# Patient Record
Sex: Female | Born: 1935
Health system: Southern US, Community
[De-identification: ages and names within clinical notes are randomized; demographics above are authoritative.]

## PROBLEM LIST (undated history)

## (undated) ENCOUNTER — Emergency Department (HOSPITAL_COMMUNITY): Payer: PRIVATE HEALTH INSURANCE | Source: Home / Self Care

## (undated) DIAGNOSIS — K5730 Diverticulosis of large intestine without perforation or abscess without bleeding: Secondary | ICD-10-CM

## (undated) DIAGNOSIS — N23 Unspecified renal colic: Secondary | ICD-10-CM

## (undated) DIAGNOSIS — K219 Gastro-esophageal reflux disease without esophagitis: Secondary | ICD-10-CM

## (undated) DIAGNOSIS — E785 Hyperlipidemia, unspecified: Secondary | ICD-10-CM

## (undated) DIAGNOSIS — R42 Dizziness and giddiness: Secondary | ICD-10-CM

## (undated) HISTORY — DX: Dizziness and giddiness: R42

## (undated) HISTORY — DX: Diverticulosis of large intestine without perforation or abscess without bleeding: K57.30

## (undated) HISTORY — DX: Gastro-esophageal reflux disease without esophagitis: K21.9

## (undated) HISTORY — DX: Hyperlipidemia, unspecified: E78.5

## (undated) HISTORY — PX: ABDOMINAL HYSTERECTOMY: SHX81

## (undated) HISTORY — PX: EYE SURGERY: SHX253

## (undated) HISTORY — DX: Unspecified renal colic: N23

---

## 1987-05-23 HISTORY — PX: CHOLECYSTECTOMY: SHX55

## 2000-05-22 HISTORY — PX: CATARACT EXTRACTION: SUR2

## 2000-12-19 ENCOUNTER — Emergency Department (HOSPITAL_COMMUNITY): Admission: EM | Admit: 2000-12-19 | Discharge: 2000-12-19 | Payer: Self-pay | Admitting: Emergency Medicine

## 2002-08-08 ENCOUNTER — Encounter: Payer: Self-pay | Admitting: *Deleted

## 2002-08-08 ENCOUNTER — Ambulatory Visit (HOSPITAL_COMMUNITY): Admission: RE | Admit: 2002-08-08 | Discharge: 2002-08-08 | Payer: Self-pay | Admitting: *Deleted

## 2002-08-13 ENCOUNTER — Encounter: Payer: Self-pay | Admitting: Family Medicine

## 2002-08-13 ENCOUNTER — Ambulatory Visit (HOSPITAL_COMMUNITY): Admission: RE | Admit: 2002-08-13 | Discharge: 2002-08-13 | Payer: Self-pay | Admitting: Family Medicine

## 2003-08-10 ENCOUNTER — Ambulatory Visit (HOSPITAL_COMMUNITY): Admission: RE | Admit: 2003-08-10 | Discharge: 2003-08-10 | Payer: Self-pay | Admitting: Family Medicine

## 2004-07-11 ENCOUNTER — Ambulatory Visit: Payer: Self-pay | Admitting: Family Medicine

## 2004-08-11 ENCOUNTER — Ambulatory Visit (HOSPITAL_COMMUNITY): Admission: RE | Admit: 2004-08-11 | Discharge: 2004-08-11 | Payer: Self-pay | Admitting: Family Medicine

## 2004-09-08 ENCOUNTER — Ambulatory Visit: Payer: Self-pay | Admitting: Internal Medicine

## 2004-09-09 ENCOUNTER — Ambulatory Visit (HOSPITAL_COMMUNITY): Admission: RE | Admit: 2004-09-09 | Discharge: 2004-09-09 | Payer: Self-pay | Admitting: Internal Medicine

## 2005-04-06 ENCOUNTER — Ambulatory Visit: Payer: Self-pay | Admitting: Family Medicine

## 2005-04-07 ENCOUNTER — Ambulatory Visit (HOSPITAL_COMMUNITY): Admission: RE | Admit: 2005-04-07 | Discharge: 2005-04-07 | Payer: Self-pay | Admitting: Family Medicine

## 2005-05-18 ENCOUNTER — Ambulatory Visit: Payer: Self-pay | Admitting: Internal Medicine

## 2005-05-22 HISTORY — PX: ESOPHAGOGASTRODUODENOSCOPY: SHX1529

## 2005-05-23 ENCOUNTER — Ambulatory Visit (HOSPITAL_COMMUNITY): Admission: RE | Admit: 2005-05-23 | Discharge: 2005-05-23 | Payer: Self-pay | Admitting: Internal Medicine

## 2005-05-23 ENCOUNTER — Ambulatory Visit: Payer: Self-pay | Admitting: Internal Medicine

## 2005-05-25 ENCOUNTER — Ambulatory Visit: Payer: Self-pay | Admitting: Family Medicine

## 2005-06-22 ENCOUNTER — Ambulatory Visit (HOSPITAL_COMMUNITY): Admission: RE | Admit: 2005-06-22 | Discharge: 2005-06-22 | Payer: Self-pay | Admitting: Family Medicine

## 2005-07-31 ENCOUNTER — Inpatient Hospital Stay (HOSPITAL_COMMUNITY): Admission: EM | Admit: 2005-07-31 | Discharge: 2005-08-01 | Payer: Self-pay | Admitting: Emergency Medicine

## 2005-09-07 ENCOUNTER — Ambulatory Visit (HOSPITAL_COMMUNITY): Admission: RE | Admit: 2005-09-07 | Discharge: 2005-09-07 | Payer: Self-pay | Admitting: Family Medicine

## 2005-09-25 ENCOUNTER — Ambulatory Visit (HOSPITAL_COMMUNITY): Admission: RE | Admit: 2005-09-25 | Discharge: 2005-09-25 | Payer: Self-pay | Admitting: Urology

## 2005-10-04 ENCOUNTER — Ambulatory Visit (HOSPITAL_COMMUNITY): Admission: RE | Admit: 2005-10-04 | Discharge: 2005-10-04 | Payer: Self-pay | Admitting: Family Medicine

## 2005-11-29 ENCOUNTER — Ambulatory Visit: Payer: Self-pay | Admitting: Family Medicine

## 2006-02-07 ENCOUNTER — Ambulatory Visit: Payer: Self-pay | Admitting: Family Medicine

## 2006-02-08 ENCOUNTER — Ambulatory Visit (HOSPITAL_COMMUNITY): Admission: RE | Admit: 2006-02-08 | Discharge: 2006-02-08 | Payer: Self-pay | Admitting: Family Medicine

## 2006-02-12 ENCOUNTER — Ambulatory Visit (HOSPITAL_COMMUNITY): Admission: RE | Admit: 2006-02-12 | Discharge: 2006-02-12 | Payer: Self-pay | Admitting: Family Medicine

## 2006-04-11 ENCOUNTER — Ambulatory Visit: Payer: Self-pay | Admitting: Family Medicine

## 2006-07-02 ENCOUNTER — Encounter: Payer: Self-pay | Admitting: Family Medicine

## 2006-07-02 LAB — CONVERTED CEMR LAB
ALT: 16 units/L (ref 0–35)
Alkaline Phosphatase: 80 units/L (ref 39–117)
BUN: 13 mg/dL (ref 6–23)
Bilirubin, Direct: 0.1 mg/dL (ref 0.0–0.3)
Cholesterol: 225 mg/dL — ABNORMAL HIGH (ref 0–200)
Creatinine, Ser: 0.83 mg/dL (ref 0.40–1.20)
Glucose, Bld: 100 mg/dL — ABNORMAL HIGH (ref 70–99)
Indirect Bilirubin: 0.5 mg/dL (ref 0.0–0.9)
Potassium: 4.3 meq/L (ref 3.5–5.3)
Total Protein: 6.6 g/dL (ref 6.0–8.3)
Triglycerides: 118 mg/dL (ref <150)
VLDL: 24 mg/dL (ref 0–40)

## 2006-07-04 ENCOUNTER — Ambulatory Visit: Payer: Self-pay | Admitting: Family Medicine

## 2006-10-17 ENCOUNTER — Ambulatory Visit (HOSPITAL_COMMUNITY): Admission: RE | Admit: 2006-10-17 | Discharge: 2006-10-17 | Payer: Self-pay | Admitting: Family Medicine

## 2006-10-24 ENCOUNTER — Encounter: Payer: Self-pay | Admitting: Family Medicine

## 2006-10-24 ENCOUNTER — Other Ambulatory Visit: Admission: RE | Admit: 2006-10-24 | Discharge: 2006-10-24 | Payer: Self-pay | Admitting: Family Medicine

## 2006-10-24 ENCOUNTER — Ambulatory Visit: Payer: Self-pay | Admitting: Family Medicine

## 2007-03-25 ENCOUNTER — Ambulatory Visit: Payer: Self-pay | Admitting: Family Medicine

## 2007-08-01 ENCOUNTER — Encounter: Payer: Self-pay | Admitting: Family Medicine

## 2007-08-01 LAB — CONVERTED CEMR LAB
BUN: 10 mg/dL (ref 6–23)
Basophils Absolute: 0 10*3/uL (ref 0.0–0.1)
Basophils Relative: 1 % (ref 0–1)
Cholesterol: 182 mg/dL (ref 0–200)
Creatinine, Ser: 0.81 mg/dL (ref 0.40–1.20)
Eosinophils Absolute: 0.1 10*3/uL (ref 0.0–0.7)
HDL: 53 mg/dL (ref >39)
Hemoglobin: 14.2 g/dL (ref 12.0–15.0)
MCHC: 32.6 g/dL (ref 30.0–36.0)
MCV: 92 fL (ref 78.0–100.0)
Monocytes Absolute: 0.7 10*3/uL (ref 0.1–1.0)
Monocytes Relative: 9 % (ref 3–12)
Neutro Abs: 4.5 10*3/uL (ref 1.7–7.7)
Neutrophils Relative %: 59 % (ref 43–77)
RBC: 4.74 M/uL (ref 3.87–5.11)
RDW: 14.1 % (ref 11.5–15.5)
Triglycerides: 122 mg/dL (ref <150)

## 2007-08-06 ENCOUNTER — Ambulatory Visit: Payer: Self-pay | Admitting: Family Medicine

## 2007-08-06 ENCOUNTER — Ambulatory Visit (HOSPITAL_COMMUNITY): Admission: RE | Admit: 2007-08-06 | Discharge: 2007-08-06 | Payer: Self-pay | Admitting: Family Medicine

## 2007-08-13 ENCOUNTER — Encounter: Payer: Self-pay | Admitting: Family Medicine

## 2007-08-13 DIAGNOSIS — E785 Hyperlipidemia, unspecified: Secondary | ICD-10-CM | POA: Insufficient documentation

## 2007-08-13 DIAGNOSIS — N23 Unspecified renal colic: Secondary | ICD-10-CM | POA: Insufficient documentation

## 2007-08-13 DIAGNOSIS — K219 Gastro-esophageal reflux disease without esophagitis: Secondary | ICD-10-CM | POA: Insufficient documentation

## 2007-08-13 DIAGNOSIS — R42 Dizziness and giddiness: Secondary | ICD-10-CM

## 2007-08-13 DIAGNOSIS — M810 Age-related osteoporosis without current pathological fracture: Secondary | ICD-10-CM

## 2007-10-07 ENCOUNTER — Ambulatory Visit: Payer: Self-pay | Admitting: Family Medicine

## 2007-10-31 ENCOUNTER — Ambulatory Visit (HOSPITAL_COMMUNITY): Admission: RE | Admit: 2007-10-31 | Discharge: 2007-10-31 | Payer: Self-pay | Admitting: Family Medicine

## 2008-01-01 ENCOUNTER — Encounter: Payer: Self-pay | Admitting: Family Medicine

## 2008-01-07 ENCOUNTER — Ambulatory Visit: Payer: Self-pay | Admitting: Family Medicine

## 2008-04-08 ENCOUNTER — Ambulatory Visit: Payer: Self-pay | Admitting: Family Medicine

## 2008-04-14 ENCOUNTER — Encounter: Payer: Self-pay | Admitting: Family Medicine

## 2008-04-14 ENCOUNTER — Ambulatory Visit (HOSPITAL_COMMUNITY): Admission: RE | Admit: 2008-04-14 | Discharge: 2008-04-14 | Payer: Self-pay | Admitting: Family Medicine

## 2008-04-23 ENCOUNTER — Telehealth: Payer: Self-pay | Admitting: Family Medicine

## 2008-04-27 ENCOUNTER — Telehealth: Payer: Self-pay | Admitting: Family Medicine

## 2008-04-28 ENCOUNTER — Encounter: Payer: Self-pay | Admitting: Family Medicine

## 2008-04-29 ENCOUNTER — Encounter: Payer: Self-pay | Admitting: Family Medicine

## 2008-05-05 ENCOUNTER — Encounter: Payer: Self-pay | Admitting: Family Medicine

## 2008-07-08 ENCOUNTER — Encounter: Payer: Self-pay | Admitting: Family Medicine

## 2008-07-14 ENCOUNTER — Encounter: Payer: Self-pay | Admitting: Family Medicine

## 2008-09-11 ENCOUNTER — Encounter: Payer: Self-pay | Admitting: Family Medicine

## 2008-09-14 LAB — CONVERTED CEMR LAB
Basophils Absolute: 0.1 10*3/uL (ref 0.0–0.1)
Chloride: 106 meq/L (ref 96–112)
Cholesterol: 245 mg/dL — ABNORMAL HIGH (ref 0–200)
Creatinine, Ser: 1.03 mg/dL (ref 0.40–1.20)
HDL: 75 mg/dL (ref >39)
LDL Cholesterol: 144 mg/dL — ABNORMAL HIGH (ref 0–99)
Lymphocytes Relative: 40 % (ref 12–46)
Lymphs Abs: 2.6 10*3/uL (ref 0.7–4.0)
Neutro Abs: 3 10*3/uL (ref 1.7–7.7)
Neutrophils Relative %: 47 % (ref 43–77)
Platelets: 282 10*3/uL (ref 150–400)
Potassium: 5.3 meq/L (ref 3.5–5.3)
RBC: 4.81 M/uL (ref 3.87–5.11)
Triglycerides: 129 mg/dL (ref <150)
VLDL: 26 mg/dL (ref 0–40)

## 2008-09-17 ENCOUNTER — Other Ambulatory Visit: Admission: RE | Admit: 2008-09-17 | Discharge: 2008-09-17 | Payer: Self-pay | Admitting: Family Medicine

## 2008-09-17 ENCOUNTER — Ambulatory Visit: Payer: Self-pay | Admitting: Family Medicine

## 2008-09-17 ENCOUNTER — Encounter: Payer: Self-pay | Admitting: Family Medicine

## 2008-09-17 DIAGNOSIS — N760 Acute vaginitis: Secondary | ICD-10-CM | POA: Insufficient documentation

## 2008-09-17 DIAGNOSIS — N3000 Acute cystitis without hematuria: Secondary | ICD-10-CM | POA: Insufficient documentation

## 2008-09-17 LAB — CONVERTED CEMR LAB
Ketones, urine, test strip: NEGATIVE
Nitrite: NEGATIVE
Protein, U semiquant: NEGATIVE
Specific Gravity, Urine: 1.015
Urobilinogen, UA: 0.2

## 2008-09-18 ENCOUNTER — Encounter: Payer: Self-pay | Admitting: Family Medicine

## 2008-09-18 LAB — CONVERTED CEMR LAB
Candida species: NEGATIVE
Trichomonal Vaginitis: NEGATIVE

## 2008-11-17 ENCOUNTER — Ambulatory Visit (HOSPITAL_COMMUNITY): Admission: RE | Admit: 2008-11-17 | Discharge: 2008-11-17 | Payer: Self-pay | Admitting: Family Medicine

## 2009-02-18 ENCOUNTER — Ambulatory Visit: Payer: Self-pay | Admitting: Family Medicine

## 2009-02-22 LAB — CONVERTED CEMR LAB
ALT: 14 units/L (ref 0–35)
Bilirubin, Direct: 0.1 mg/dL (ref 0.0–0.3)
Indirect Bilirubin: 0.5 mg/dL (ref 0.0–0.9)
Total Bilirubin: 0.6 mg/dL (ref 0.3–1.2)
Vit D, 25-Hydroxy: 8 ng/mL — ABNORMAL LOW (ref 30–89)

## 2009-06-23 LAB — CONVERTED CEMR LAB
ALT: 13 units/L (ref 0–35)
AST: 22 units/L (ref 0–37)
Albumin: 4 g/dL (ref 3.5–5.2)
Bilirubin, Direct: 0.1 mg/dL (ref 0.0–0.3)
Cholesterol: 190 mg/dL (ref 0–200)
HDL: 73 mg/dL (ref >39)
Total CHOL/HDL Ratio: 2.6
Total Protein: 6.2 g/dL (ref 6.0–8.3)
Triglycerides: 117 mg/dL (ref <150)
VLDL: 23 mg/dL (ref 0–40)
Vit D, 25-Hydroxy: 40 ng/mL (ref 30–89)

## 2009-06-30 ENCOUNTER — Ambulatory Visit: Payer: Self-pay | Admitting: Family Medicine

## 2009-07-07 ENCOUNTER — Telehealth: Payer: Self-pay | Admitting: Family Medicine

## 2009-07-07 ENCOUNTER — Ambulatory Visit (HOSPITAL_COMMUNITY): Admission: RE | Admit: 2009-07-07 | Discharge: 2009-07-07 | Payer: Self-pay | Admitting: Family Medicine

## 2009-10-27 LAB — CONVERTED CEMR LAB
BUN: 15 mg/dL (ref 6–23)
Basophils Absolute: 0.1 10*3/uL (ref 0.0–0.1)
Chloride: 105 meq/L (ref 96–112)
Creatinine, Ser: 0.93 mg/dL (ref 0.40–1.20)
Eosinophils Relative: 4 % (ref 0–5)
HCT: 43.3 % (ref 36.0–46.0)
Lymphocytes Relative: 32 % (ref 12–46)
Lymphs Abs: 1.9 10*3/uL (ref 0.7–4.0)
Neutro Abs: 3.2 10*3/uL (ref 1.7–7.7)
Neutrophils Relative %: 54 % (ref 43–77)
Platelets: 242 10*3/uL (ref 150–400)
Potassium: 4.5 meq/L (ref 3.5–5.3)
RDW: 14 % (ref 11.5–15.5)
Vit D, 25-Hydroxy: 29 ng/mL — ABNORMAL LOW (ref 30–89)
WBC: 5.9 10*3/uL (ref 4.0–10.5)

## 2009-11-03 ENCOUNTER — Ambulatory Visit: Payer: Self-pay | Admitting: Family Medicine

## 2009-11-23 ENCOUNTER — Ambulatory Visit (HOSPITAL_COMMUNITY): Admission: RE | Admit: 2009-11-23 | Discharge: 2009-11-23 | Payer: Self-pay | Admitting: Family Medicine

## 2010-04-11 ENCOUNTER — Ambulatory Visit: Payer: Self-pay | Admitting: Family Medicine

## 2010-06-12 ENCOUNTER — Encounter: Payer: Self-pay | Admitting: Family Medicine

## 2010-06-21 NOTE — Progress Notes (Signed)
Summary: ? medicine  Phone Note Call from Patient   Summary of Call: wants to know does she need to stop taking the lovastatin call her back at [phone] Initial call taken by: Dierdre Harness,  July 07, 2009 9:28 AM  Follow-up for Phone Call        advise she needs to cntinue this and pls refill x 4 Follow-up by: Tula Nakayama MD,  July 07, 2009 9:47 AM    Prescriptions: LOVASTATIN 40 MG TABS (LOVASTATIN) Take 1 tab by mouth at bedtime  #30 x 3   Entered by:   Baldomero Lamy LPN   Authorized by:   Tula Nakayama MD   Signed by:   Baldomero Lamy LPN on 075-GRM   Method used:   Electronically to        [facility] [address] (retail)       [address]       [place], [place]  [address]       Ph: [phone]       Fax: [phone]   RxID:   [id number]

## 2010-06-21 NOTE — Assessment & Plan Note (Signed)
Summary: office visit   Vital Signs:  Patient profile:   77 year old female Height:      58.5 inches Weight:      113.25 pounds BMI:     23.35 O2 Sat:      97 % Pulse rate:   80 / minute Pulse rhythm:   regular Resp:     16 per minute BP sitting:   118 / 72  (left arm) Cuff size:   regular CC: Follow up visit   CC:  Follow up visit.  History of Present Illness: Pt reports tha t she tried foasamax for 2 months  however it made her nervous, with palpitations, and generally feeling poorly sio she stopped. She takes calium and vit d ever day She denies any recent fever or chills. She denies head or chest congestion. She denies dysuria , frequency or incontinence. She denies significant joint pain or stiffness, but she does have intermittentt aches and pains as is to be expected  Current Medications (verified): 1)  Omeprazole 20 Mg  Tbec (Omeprazole) .... Take 1 Tablet By Mouth Once A Day 2)  Oscal 500/200 D-3 500-200 Mg-Unit  Tabs (Calcium-Vitamin D) .... Take 1 Tablet By Mouth Three Times A Day 3)  Aspirin 81 Mg  Tbec (Aspirin) .... Take 1 Tablet By Mouth Once A Day 4)  Vitamin D 400 Unit Caps (Cholecalciferol) .... Take 1 Tablet By Mouth Once A Day 5)  Lovastatin 40 Mg Tabs (Lovastatin) .... Take 1 Tab By Mouth At Bedtime 6)  Vitamin D (Ergocalciferol) 50000 Unit Caps (Ergocalciferol) .... One Cap By Mouth Weekly  Allergies: 1)  ! Penicillin 2)  ! Fosamax  Review of Systems      See HPI General:  Complains of fatigue; denies loss of appetite, malaise, weakness, and weight loss. Eyes:  Denies discharge, eye pain, and red eye. ENT:  Denies earache, hoarseness, nasal congestion, and sinus pressure. CV:  Denies chest pain or discomfort, difficulty breathing while lying down, palpitations, and swelling of feet. Resp:  Denies cough and sputum productive. GI:  Denies abdominal pain, constipation, diarrhea, nausea, and vomiting. GU:  Denies dysuria, urinary frequency, and urinary  hesitancy. Derm:  Denies itching, lesion(s), and rash. Neuro:  Denies headaches, memory loss, seizures, and sensation of room spinning. Psych:  Denies anxiety and depression. Endo:  Denies excessive thirst and excessive urination. Heme:  Denies abnormal bruising and bleeding. Allergy:  Complains of seasonal allergies; denies hives or rash and itching eyes.  Physical Exam  General:  alert, well-nourished, and well-hydrated.  HEENT: No facial asymmetry,  EOMI, No sinus tenderness, TM's Clear, oropharynx  pink and moist.   Chest: Clear to auscultation bilaterally.  CVS: S1, S2, No murmurs, No S3.   Abd: Soft, Nontender.  MS: Adequate ROM spine, hips, shoulders and knees.  Ext: No edema.   CNS: CN 2-12 intact, power tone and sensation normal throughout.   Skin: Intact, no visible lesions or rashes.  Psych: Good eye contact, normal affect.  Memory intact, not anxious or depressed appearing.     Impression & Recommendations:  Problem # 1:  GERD (ICD-530.81) Assessment Improved  Her updated medication list for this problem includes:    Omeprazole 20 Mg Tbec (Omeprazole) .Marland Kitchen... Take 1 tablet by mouth once a day  Problem # 2:  OSTEOPOROSIS (ICD-733.00) Assessment: Comment Only  The following medications were removed from the medication list:    Alendronate Sodium 70 Mg Tabs (Alendronate sodium) ..... One tab once  weekly will attempt to ise calcitonin in oits place, will contact pt  Problem # 3:  HYPERLIPIDEMIA (ICD-272.4) Assessment: Improved  Her updated medication list for this problem includes:    Lovastatin 40 Mg Tabs (Lovastatin) .Marland Kitchen... Take 1 tab by mouth at bedtime  Labs Reviewed: SGOT: 22 (06/23/2009)   SGPT: 13 (06/23/2009)   HDL:73 (06/23/2009), 75 (09/11/2008)  LDL:94 (06/23/2009), 144 (09/11/2008)  Chol:190 (06/23/2009), 245 (09/11/2008)  Trig:117 (06/23/2009), 129 (09/11/2008), needs fasting labs in August  Complete Medication List: 1)  Omeprazole 20 Mg Tbec  (Omeprazole) .... Take 1 tablet by mouth once a day 2)  Oscal 500/200 D-3 500-200 Mg-unit Tabs (Calcium-vitamin d) .... Take 1 tablet by mouth three times a day 3)  Aspirin 81 Mg Tbec (Aspirin) .... Take 1 tablet by mouth once a day 4)  Vitamin D 400 Unit Caps (Cholecalciferol) .... Take 1 tablet by mouth once a day 5)  Lovastatin 40 Mg Tabs (Lovastatin) .... Take 1 tab by mouth at bedtime 6)  Vitamin D (ergocalciferol) 50000 Unit Caps (Ergocalciferol) .... One cap by mouth weekly  Patient Instructions: 1)  Please schedule a follow-up appointment in 4 months. 2)  Your cholesterol is great, pls continue the lovastatin. 3)  Your vit D level has fallen slightly it is impt to take TWO vit D 400IU tabs every day till done then the new calc with d tab 1200mg /100-0 4)  Continue to be active and involved  in your home and community 5)  Schedule your mammogram. Prescriptions: LOVASTATIN 40 MG TABS (LOVASTATIN) Take 1 tab by mouth at bedtime  #30 x 5   Entered by:   Kate Sable LPN   Authorized by:   Tula Nakayama MD   Signed by:   Kate Sable LPN on 579FGE   Method used:   Electronically to        [facility] [address] (retail)       [place], [place]  [address]       Ph: [phone]       Fax: [phone]   RxID:   [id number] OMEPRAZOLE 20 MG  TBEC (OMEPRAZOLE) Take 1 tablet by mouth once a day  #30 Capsule x 5   Entered by:   Kate Sable LPN   Authorized by:   Tula Nakayama MD   Signed by:   Kate Sable LPN on 579FGE   Method used:   Electronically to        [facility] [address] (retail)       [address]       [place], [place]  [address]       Ph: [phone]       Fax: [phone]   RxID:   [id number]

## 2010-06-21 NOTE — Assessment & Plan Note (Signed)
Summary: f up   Vital Signs:  Patient profile:   77 year old female Height:      58.5 inches Weight:      110.75 pounds BMI:     22.84 O2 Sat:      99 % on Room air Pulse rate:   75 / minute Resp:     16 per minute BP sitting:   140 / 70  (left arm)  Vitals Entered By: Baldomero Lamy LPN (November 21, 624THL 10:06 AM)  O2 Flow:  Room air CC: follow-up visit, Depression Is Patient Diabetic? No Pain Assessment Patient in pain? no        CC:  follow-up visit and Depression.  History of Present Illness: pt states this ast weekend on friday she std having lightheadedness, and spinning in the head , aslo some nausea and imbalance. Last episode about 4 yrs ago. She ahs used dramaminewith some beenfit. wats meclizine sent in has only taken 2 dramamine yesterday, that's all , feels a bit weak but not as light headed and unsteady as before. Reports  that she has otherwise been  doing well. Denies recent fever or chills. Denies sinus pressure, nasal congestion , ear pain or sore throat. Denies chest congestion, or cough productive of sputum. Denies chest pain, palpitations, PND, orthopnea or leg swelling. Denies abdominal pain, nausea, vomitting, diarrhea or constipation. Denies change in bowel movements or bloody stool. Denies dysuria , frequency, incontinence or hesitancy. Denies  joint pain, swelling, or reduced mobility. Denies headaches, , seizures. Denies depression, anxiety or insomnia. Denies  rash, lesions, or itch.      Current Medications (verified): 1)  Omeprazole 20 Mg  Tbec (Omeprazole) .... Take 1 Tablet By Mouth Once A Day 2)  Aspirin 81 Mg  Tbec (Aspirin) .... Take 1 Tablet By Mouth Once A Day 3)  Vitamin D 400 Unit Caps (Cholecalciferol) .... Take 1 Tablet By Mouth Once A Day 4)  Lovastatin 40 Mg Tabs (Lovastatin) .... Take 1 Tab By Mouth At Bedtime 5)  Calcium Antacid 500 Mg Chew (Calcium Carbonate Antacid) .... One Tab By Mouth Once Daily  Allergies  (verified): 1)  ! Penicillin 2)  ! Fosamax  Review of Systems      See HPI Eyes:  Denies discharge, eye pain, and red eye. Psych:  Denies anxiety and depression. Endo:  Denies cold intolerance, excessive hunger, excessive thirst, and heat intolerance. Heme:  Denies abnormal bruising and bleeding. Allergy:  Denies hives or rash and itching eyes.  Physical Exam  General:  Well-developed,well-nourished,in no acute distress; alert,appropriate and cooperative throughout examination HEENT: No facial asymmetry,  EOMI, No sinus tenderness, TM's Clear, oropharynx  pink and moist.   Chest: Clear to auscultation bilaterally.  CVS: S1, S2, No murmurs, No S3.   Abd: Soft, Nontender.  MS: Adequate ROM spine, hips, shoulders and knees.  Ext: No edema.   CNS: CN 2-12 intact, power tone and sensation normal throughout.   Skin: Intact, no visible lesions or rashes.  Psych: Good eye contact, normal affect.  Memory intact, not anxious or depressed appearing.    Impression & Recommendations:  Problem # 1:  INTERMITTENT VERTIGO (ICD-780.4) Assessment Comment Only  Her updated medication list for this problem includes:    Meclizine Hcl 12.5 Mg Tabs (Meclizine hcl) .Marland Kitchen... Take 1 tablet by mouth three times a day as needed for dizziness  Orders: Medicare Electronic Prescription [phone])  Problem # 2:  GERD (ICD-530.81) Assessment: Unchanged  Her updated medication  list for this problem includes:    Omeprazole 20 Mg Tbec (Omeprazole) .Marland Kitchen... Take 1 tablet by mouth once a day    Calcium Antacid 500 Mg Chew (Calcium carbonate antacid) ..... One tab by mouth once daily  Problem # 3:  HYPERLIPIDEMIA (ICD-272.4) Assessment: Comment Only  Her updated medication list for this problem includes:    Lovastatin 40 Mg Tabs (Lovastatin) .Marland Kitchen... Take 1 tab by mouth at bedtime Low fat dietdiscussed and encouraged  Labs Reviewed: SGOT: 22 (06/23/2009)   SGPT: 13 (06/23/2009)   HDL:73 (06/23/2009), 75  (09/11/2008)  LDL:94 (06/23/2009), 144 (09/11/2008)  Chol:190 (06/23/2009), 245 (09/11/2008)  Trig:117 (06/23/2009), 129 (09/11/2008)  Complete Medication List: 1)  Omeprazole 20 Mg Tbec (Omeprazole) .... Take 1 tablet by mouth once a day 2)  Aspirin 81 Mg Tbec (Aspirin) .... Take 1 tablet by mouth once a day 3)  Vitamin D 400 Unit Caps (Cholecalciferol) .... Take 1 tablet by mouth once a day 4)  Lovastatin 40 Mg Tabs (Lovastatin) .... Take 1 tab by mouth at bedtime 5)  Calcium Antacid 500 Mg Chew (Calcium carbonate antacid) .... One tab by mouth once daily 6)  Meclizine Hcl 12.5 Mg Tabs (Meclizine hcl) .... Take 1 tablet by mouth three times a day as needed for dizziness  Other Orders: T-Lipid Profile [id number]) T-Hepatic Function [id number])  Patient Instructions: 1)  Please schedule a follow-up appointment in 4.5 months. 2)  Hepatic Panel prior to visit, ICD-9: 3)  Lipid Panel prior to visit, ICD-9:  fasting in 4.5 months 4)  med for dizziness is sent in Prescriptions: LOVASTATIN 40 MG TABS (LOVASTATIN) Take 1 tab by mouth at bedtime  #30 Tablet x 3   Entered by:   Baldomero Lamy LPN   Authorized by:   Tula Nakayama MD   Signed by:   Baldomero Lamy LPN on 075-GRM   Method used:   Electronically to        [facility] [address] (retail)       [address]       [place], [place]  [address]       Ph: [phone]       Fax: [phone]   RxID:   [id number] MECLIZINE HCL 12.5 MG TABS (MECLIZINE HCL) Take 1 tablet by mouth three times a day as needed for dizziness  #30 x 0   Entered and Authorized by:   Tula Nakayama MD   Signed by:   Tula Nakayama MD on 04/11/2010   Method used:   Electronically to        [facility] (retail)       [address]       [place], [place]  [address]       Ph: [phone]       Fax: [phone]   RxID:   [id number]    Orders Added: 1)  Est. Patient Level IV  [id number] 2)  Medicare Electronic Prescription [id number])  T-Lipid Profile [id number])  T-Hepatic Function [id number]

## 2010-06-21 NOTE — Assessment & Plan Note (Signed)
Summary: office visit   Vital Signs:  Patient profile:   77 year old female Height:      58.5 inches Weight:      112 pounds O2 Sat:      97 % Pulse rate:   90 / minute Pulse rhythm:   regular Resp:     16 per minute BP sitting:   110 / 68  (left arm) Cuff size:   regular  Vitals Entered By: Kate Sable (June 30, 2009 9:34 AM) CC: Follow up chronic problems Is Patient Diabetic? No Pain Assessment Patient in pain? no        CC:  Follow up chronic problems.  History of Present Illness: Reports  that tshe has been doing well. Denies recent fever or chills. Denies sinus pressure, nasal congestion , ear pain or sore throat. Denies chest congestion, or cough productive of sputum. Denies chest pain, palpitations, PND, orthopnea or leg swelling. Denies abdominal pain, nausea, vomitting, diarrhea or constipation. Denies change in bowel movements or bloody stool. Denies dysuria , frequency, incontinence or hesitancy. Denies  joint pain, swelling, or reduced mobility. Denies headaches, vertigo, seizures. Denies depression, anxiety or insomnia. Denies  rash, lesions, or itch.     Anticoagulation Management History:      Positive risk factors for bleeding include an age of 58 years or older.  The bleeding index is 'intermediate risk'.  Positive CHADS2 values include Age > 73 years old.     Current Medications (verified): 1)  Omeprazole 20 Mg  Tbec (Omeprazole) .... Take 1 Tablet By Mouth Once A Day 2)  Oscal 500/200 D-3 500-200 Mg-Unit  Tabs (Calcium-Vitamin D) .... Take 1 Tablet By Mouth Three Times A Day 3)  Aspirin 81 Mg  Tbec (Aspirin) .... Take 1 Tablet By Mouth Once A Day 4)  Vitamin D 400 Unit Caps (Cholecalciferol) .... Take 1 Tablet By Mouth Once A Day 5)  Lovastatin 40 Mg Tabs (Lovastatin) .... Take 1 Tab By Mouth At Bedtime 6)  Vitamin D (Ergocalciferol) 50000 Unit Caps (Ergocalciferol) .... One Cap By Mouth Weekly  Allergies (verified): 1)  !  Penicillin  Review of Systems      See HPI Eyes:  Denies blurring and discharge. Heme:  Denies abnormal bruising. Allergy:  Denies hives or rash and itching eyes.  Physical Exam  General:  alert, well-nourished, and well-hydrated.  HEENT: No facial asymmetry,  EOMI, No sinus tenderness, TM's Clear, oropharynx  pink and moist.   Chest: Clear to auscultation bilaterally.  CVS: S1, S2, No murmurs, No S3.   Abd: Soft, Nontender.  MS: Adequate ROM spine, hips, shoulders and knees.  Ext: No edema.   CNS: CN 2-12 intact, power tone and sensation normal throughout.   Skin: Intact, no visible lesions or rashes.  Psych: Good eye contact, normal affect.  Memory intact, not anxious or depressed appearing.     Impression & Recommendations:  Problem # 1:  GERD (ICD-530.81) Assessment Improved  Her updated medication list for this problem includes:    Omeprazole 20 Mg Tbec (Omeprazole) .Marland Kitchen... Take 1 tablet by mouth once a day  Problem # 2:  HYPERLIPIDEMIA (ICD-272.4) Assessment: Comment Only  Her updated medication list for this problem includes:    Lovastatin 40 Mg Tabs (Lovastatin) .Marland Kitchen... Take 1 tab by mouth at bedtime  Labs Reviewed: SGOT: 22 (06/23/2009)   SGPT: 13 (06/23/2009)   HDL:73 (06/23/2009), 75 (09/11/2008)  LDL:94 (06/23/2009), 144 (09/11/2008)  Chol:190 (06/23/2009), 245 (09/11/2008)  Trig:117 (  06/23/2009), 129 (09/11/2008)  Problem # 3:  OSTEOPOROSIS (ICD-733.00) Assessment: Deteriorated  Her updated medication list for this problem includes:    Alendronate Sodium 70 Mg Tabs (Alendronate sodium) ..... One tab once weekly  Orders: T-Vitamin D (25-Hydroxy) [phone]) Radiology Referral (Radiology)  Complete Medication List: 1)  Omeprazole 20 Mg Tbec (Omeprazole) .... Take 1 tablet by mouth once a day 2)  Oscal 500/200 D-3 500-200 Mg-unit Tabs (Calcium-vitamin d) .... Take 1 tablet by mouth three times a day 3)  Aspirin 81 Mg Tbec (Aspirin) .... Take 1 tablet by  mouth once a day 4)  Vitamin D 400 Unit Caps (Cholecalciferol) .... Take 1 tablet by mouth once a day 5)  Lovastatin 40 Mg Tabs (Lovastatin) .... Take 1 tab by mouth at bedtime 6)  Vitamin D (ergocalciferol) 50000 Unit Caps (Ergocalciferol) .... One cap by mouth weekly 7)  Alendronate Sodium 70 Mg Tabs (Alendronate sodium) .... One tab once weekly  Other Orders: T-CBC w/Diff ([id number]) T-Basic Metabolic Panel ([id number])  Patient Instructions: 1)  Please schedule a follow-up appointment in 4 months. 2)  Your labs are great, no med changes. 3)  You will be referred for a bone density scan. 4)  Continue all meds

## 2010-09-05 ENCOUNTER — Other Ambulatory Visit: Payer: Self-pay | Admitting: Family Medicine

## 2010-09-05 LAB — HEPATIC FUNCTION PANEL
ALT: 14 U/L (ref 0–35)
AST: 24 U/L (ref 0–37)
Bilirubin, Direct: 0.2 mg/dL (ref 0.0–0.3)
Indirect Bilirubin: 0.5 mg/dL (ref 0.0–0.9)
Total Protein: 6.5 g/dL (ref 6.0–8.3)

## 2010-09-05 LAB — LIPID PANEL
Cholesterol: 161 mg/dL (ref 0–200)
Total CHOL/HDL Ratio: 2.2 Ratio
Triglycerides: 93 mg/dL (ref <150)

## 2010-09-08 ENCOUNTER — Encounter: Payer: Self-pay | Admitting: Family Medicine

## 2010-09-09 ENCOUNTER — Encounter: Payer: Self-pay | Admitting: Family Medicine

## 2010-09-12 ENCOUNTER — Ambulatory Visit (INDEPENDENT_AMBULATORY_CARE_PROVIDER_SITE_OTHER): Payer: MEDICARE | Admitting: Family Medicine

## 2010-09-12 ENCOUNTER — Encounter: Payer: Self-pay | Admitting: Family Medicine

## 2010-09-12 VITALS — BP 128/74 | HR 86 | Resp 16 | Ht 60.0 in | Wt 111.0 lb

## 2010-09-12 DIAGNOSIS — R109 Unspecified abdominal pain: Secondary | ICD-10-CM

## 2010-09-12 DIAGNOSIS — Z23 Encounter for immunization: Secondary | ICD-10-CM

## 2010-09-12 DIAGNOSIS — E785 Hyperlipidemia, unspecified: Secondary | ICD-10-CM

## 2010-09-12 LAB — BASIC METABOLIC PANEL
BUN: 16 mg/dL (ref 6–23)
Calcium: 10.3 mg/dL (ref 8.4–10.5)
Glucose, Bld: 96 mg/dL (ref 70–99)

## 2010-09-12 LAB — POCT URINALYSIS DIPSTICK
Nitrite, UA: NEGATIVE
Protein, UA: NEGATIVE
Urobilinogen, UA: 0.2
pH, UA: 7

## 2010-09-12 MED ORDER — OMEPRAZOLE 20 MG PO TBEC: 20.0000 mg | DELAYED_RELEASE_TABLET | Freq: Every day | ORAL | Status: DC

## 2010-09-12 NOTE — Progress Notes (Signed)
  Subjective:    Patient ID: Kristy Jarvis, female    DOB: [birth year], 77 y.o.   MRN: [id number]  HPI 6 week h/o LUQ abndominal pain, non radiaiting, no vomitting , fever, nausea or change in bowel movements. Has a large hiatal hernia, and had kidney stones in the past. Otherwise pt reports she has been well. Recent labs are to be reviewed, and her pneumonia vaccine administered      Review of Systems Denies recent fever or chills. Denies sinus pressure, nasal congestion, ear pain or sore throat. Denies chest congestion, productive cough or wheezing. Denies chest pains, palpitations, paroxysmal nocturnal dyspnea, orthopnea and leg swelling Denies  nausea, vomiting,diarrhea or constipation.  Denies rectal bleeding or change in bowel movement. Denies dysuria, frequency, hesitancy or incontinence. Denies joint pain, swelling and limitation in mobility. Denies headaches, seizure, numbness, or tingling. Denies depression, anxiety or insomnia. Denies skin break down or rash.        Objective:   Physical Exam Patient alert and oriented and in no Cardiopulmonary distress.  HEENT: No facial asymmetry, EOMI, no sinus tenderness, TM's clear, Oropharynx pink and moist.  Neck supple no adenopathy.  Chest: Clear to auscultation bilaterally.  CVS: S1, S2 no murmurs, no S3.  ABD: Soft  Left upper quadrant tenderness, with possible mass. No guarding or rebound. Bowel sounds normal  Ext: No edema  MS: Adequate ROM spine, shoulders, hips and knees.  Skin: Intact, no ulcerations or rash noted.  Psych: Good eye contact, normal affect. Memory intact not anxious or depressed appearing.  CNS: CN 2-12 intact, power, tone and sensation normal throughout.    Rectal: no mass, guaic negative stool  Assessment & Plan:  1. Abdominal pain with possible LUQ mass, needs scan. 2. H/o renal colic, re ckeck with renal US 3.GERD; check h. pylori status 4. Hyperlipidemia: lipid labs excellent,  continue current meds

## 2010-09-12 NOTE — Patient Instructions (Signed)
F/u in 4 months.  Your cholesterol and liver lab tests are great, no med changes.  You need other labs today so you can have scans of your abdomen, and you will also have [place] of your kidneys to evaluate your pain I will refer you to the specialist you need to see once I get the radiology results

## 2010-09-13 ENCOUNTER — Other Ambulatory Visit: Payer: Self-pay | Admitting: Family Medicine

## 2010-09-13 LAB — H. PYLORI ANTIBODY, IGG: H Pylori IgG: <0.4 {ISR}

## 2010-09-15 ENCOUNTER — Telehealth: Payer: Self-pay | Admitting: Family Medicine

## 2010-09-15 ENCOUNTER — Ambulatory Visit (HOSPITAL_COMMUNITY)
Admission: RE | Admit: 2010-09-15 | Discharge: 2010-09-15 | Disposition: A | Discharge status: Home / Self Care | Payer: Medicare Other | Source: Ambulatory Visit | Attending: Family Medicine | Admitting: Family Medicine

## 2010-09-15 DIAGNOSIS — R110 Nausea: Secondary | ICD-10-CM | POA: Insufficient documentation

## 2010-09-15 DIAGNOSIS — R109 Unspecified abdominal pain: Secondary | ICD-10-CM | POA: Insufficient documentation

## 2010-09-15 DIAGNOSIS — K5730 Diverticulosis of large intestine without perforation or abscess without bleeding: Secondary | ICD-10-CM | POA: Insufficient documentation

## 2010-09-15 MED ORDER — CIPROFLOXACIN HCL 500 MG PO TABS: 500.0000 mg | ORAL_TABLET | Freq: Two times a day (BID) | ORAL | Status: AC

## 2010-09-15 NOTE — Progress Notes (Signed)
Addended by: Tula Nakayama on: 09/15/2010 07:54 AM   Modules accepted: Orders

## 2010-09-15 NOTE — Telephone Encounter (Signed)
Spoke with patient yesterday.

## 2010-09-16 ENCOUNTER — Ambulatory Visit (HOSPITAL_COMMUNITY)
Admission: RE | Admit: 2010-09-16 | Discharge: 2010-09-16 | Disposition: A | Discharge status: Home / Self Care | Payer: Medicare Other | Source: Ambulatory Visit | Attending: Family Medicine | Admitting: Family Medicine

## 2010-09-16 DIAGNOSIS — R1032 Left lower quadrant pain: Secondary | ICD-10-CM | POA: Insufficient documentation

## 2010-09-16 DIAGNOSIS — K449 Diaphragmatic hernia without obstruction or gangrene: Secondary | ICD-10-CM | POA: Insufficient documentation

## 2010-09-16 DIAGNOSIS — K5730 Diverticulosis of large intestine without perforation or abscess without bleeding: Secondary | ICD-10-CM | POA: Insufficient documentation

## 2010-09-16 MED ORDER — IOHEXOL 300 MG/ML  SOLN
100.0000 mL | Freq: Once | INTRAMUSCULAR | Status: AC | PRN
  Administered 2010-09-16: 100 mL via INTRAVENOUS

## 2010-09-19 ENCOUNTER — Telehealth: Payer: Self-pay | Admitting: *Deleted

## 2010-09-19 NOTE — Telephone Encounter (Signed)
Is there an additional msg i need to know?

## 2010-09-19 NOTE — Telephone Encounter (Signed)
Message copied by Baldomero Lamy on Mon Sep 19, 2010  9:10 AM ------      Message from: Tula Nakayama      Created: Fri Sep 16, 2010 12:12 PM       pls advise the pt the only abnormality is a hernia, so this is where her pain is coming from, I suggest eval by a surgeon pls let me know if she agrees

## 2010-09-19 NOTE — Progress Notes (Signed)
Patient states pain has subsided and wants to wait before seeing a surgeon

## 2010-09-29 ENCOUNTER — Telehealth: Payer: Self-pay | Admitting: Family Medicine

## 2010-09-29 NOTE — Telephone Encounter (Signed)
Patient denies any discomfort or swelling, states only red area growing, advised if continues to grow or swell go to urgent care for eval, patient agrees

## 2010-10-07 NOTE — H&P (Signed)
NAMEJANELI, Kristy Jarvis            ACCOUNT NO.:  [id number]   MEDICAL RECORD NO.:  [id number]          PATIENT TYPE:  AMB   LOCATION:                                FACILITY:  [facility]   PHYSICIAN:  Hildred Laser, M.D.    DATE OF BIRTH:  [birth year]   DATE OF ADMISSION:  DATE OF DISCHARGE:  LH                                HISTORY & PHYSICAL   CHIEF COMPLAINT:  Esophageal dysphagia.   HISTORY OF PRESENT ILLNESS:  Kristy Jarvis is an 77 year old Caucasian female  who was seen in our office on September 08, 2004 by Myrene Buddy, P.A.-C. She  had had a couple year history of solid food dysphagia. At that time, she was  not interested in invasive procedures, but she did oblige to a barium pill  esophagogram. She was found to have a hiatal hernia with significant delay  in passage of the 3 x 13 mm barium tablet at the GE junction. Over the last  several months, she has continued to have dysphagia with solid foods. She  has changed her diet to mostly soften foods. She denies any problems with  liquids. She still has the sensation of her food being stuck in her lower  esophagus. She does have intermittent regurgitation and vomiting as well.  She takes Tums on an as-needed basis very regularly. She had been on Aciphex  20 mg daily when she was seen back in April but has since discontinued this  once she ran out of the medication. She notes ill fitting dentures that she  received last Spring. She has been having problems with chewing with these  dentures as well. She complains of rare heartburn a couple of times a week.  Denies any problems with indigestion and has rare water brash. She denies  any anorexia or early satiety. She has lost approximately three pounds in  the last eight months.   CURRENT MEDICATIONS:  Tums p.r.n.   ALLERGIES:  PENICILLIN which causes rash.   PAST MEDICAL HISTORY:  1.  Gastroesophageal reflux disease.  2.  Osteoporosis.  3.  Hyperlipidemia which is diet controlled.  4.  Hysterectomy in 1976.  5.  Laparoscopic cholecystectomy.   FAMILY HISTORY:  Significant for diabetes mellitus and coronary artery  disease. Both a brother and sister had cancer of unknown etiology.   SOCIAL HISTORY:  Kristy Jarvis is a widow. She has a daughter. She is a  Agricultural engineer. She denies any tobacco alcohol or drug use.   REVIEW OF SYSTEMS:  CONSTITUTIONAL:  See HPI. She denies fatigue, fever or  chills. CARDIOVASCULAR:  She denies any chest pain or palpitations.  PULMONARY:  Denies any shortness of breath, dyspnea, cough or hemoptysis.  GASTROINTESTINAL:  See HPI.   PHYSICAL EXAMINATION:  VITAL SIGNS:  Weight 107.5 pounds. Height 62-1/2  inches. Temperature 98.2, blood pressure 120/64, pulse 72.  GENERAL:  Kristy Jarvis is an 77 year old elderly appearing Caucasian female  who is alert, oriented, pleasant and cooperative in no acute distress.  HEENT:  Sclerae are clear and nonicteric. Conjunctivae are pink. Oropharynx  pink and moist without any lesions.  NECK:  Supple without mass or thyromegaly.  CHEST:  Heart regular rate and rhythm with normal S1 and S2 without any  murmurs, clicks, rubs or gallops.  LUNGS:  Clear to auscultation bilaterally.  ABDOMEN:  Positive bowel sounds x4. No bruits auscultated. Soft, nontender,  nondistended without palpable mass or hepatosplenomegaly. No rebound  tenderness or guarding.  RECTAL:  Deferred.  EXTREMITIES:  Without clubbing or edema bilaterally.  SKIN:  Pink, warm and dry without any rash or jaundice.   IMPRESSION:  Kristy Jarvis is an 77 year old Caucasian female with about a two  and half year history of solid food dysphagia who had a barium pill  esophagogram earlier this year which did show a delay in passage of the  barium tablet at the GE junction. There was no evidence of definite reflux,  but she did have a 5 x 6 cm hiatal hernia as well. I suspect she may have a  lower esophageal stricture. She is requesting further  evaluation and  possible manipulation via EGD in the near future.   PLAN:  1.  Will schedule EGD with possible ED with Dr. Gala Romney in the near future. I      discussed this procedure including risks and benefits which include but      are not limited to bleeding, infection, perforation, and drug reaction.      She agrees with this plan and consent will be obtained.  2.  She was offered screening colonoscopy but declined.  3.  Aciphex 20 mg daily samples, two weeks' worth were given.  4.  I have given her prescription for Aciphex 20 mg daily #30 with 5      refills.  5.  Further recommendations pending procedure.   We would like to thank Dr. Moshe Cipro for allowing [place] to participate in the  care of Kristy Jarvis.      Les Pou, N.P.      Hildred Laser, M.D.  Electronically Signed    KC/MEDQ  D:  05/18/2005  T:  05/18/2005  Job:  [id number]

## 2010-10-07 NOTE — H&P (Signed)
[name], Kristy Jarvis            ACCOUNT NO.:  [id number]   MEDICAL RECORD NO.:  [id number]          PATIENT TYPE:  INP   LOCATION:  A313                          FACILITY:  [facility]   PHYSICIAN:  Marissa Nestle, M.D.DATE OF BIRTH:  09-Aug-1920   DATE OF ADMISSION:  07/31/2005  DATE OF DISCHARGE:  03/13/2007LH                                HISTORY & PHYSICAL   CHIEF COMPLAINT:  Right renal colic.   HISTORY:  An 77 year old female presented in the emergency department early  this morning with severe pain in her right flank. She was given pain  medication, but because of continuous pain, she was admitted for further  management and control of pain. Denies any fever, chills or gross hematuria.  No history of similar pains in the past. She had a CT scan done in the  emergency room which showed there is a 3-mm stone in the right ureteropelvic  junction causing partial hydronephrosis. Otherwise, CT scan is negative.   PAST MEDICAL HISTORY:  She takes Aciphex for acid reflux. No other medical  problem like diabetes or hypertension. She did have a hysterectomy in 1976.   MEDICATIONS:  Aciphex.   PERSONAL HISTORY:  Does not smoke or drink.   REVIEW OF SYSTEMS:  Unremarkable.   PHYSICAL EXAMINATION:  GENERAL:  Fully conscious, alert, oriented with blood  pressure 130/80. Temperature is normal.  CENTRAL NERVOUS SYSTEM:  Negative.  HEENT:  Negative.  CHEST:  Symmetric.  HEART:  Regular sinus rhythm.  ABDOMEN:  Soft. Liver, spleen and kidneys are not palpable; 1+ right CVA  tenderness.  PELVIC:  Deferred.  EXTREMITIES:  Normal.   LABORATORY DATA:  CBC and urinalysis is done. CBC is normal. Urinalysis  shows microscopic hematuria. MET7 is normal.   ASSESSMENT:  She is admitted in the hospital with right renal calculus. IV  fluids are started and also parenteral analgesia. Over the next several  hours, the pain has completely subsided. I came and discussed with the  patient and his  daughter that as long as the pain is not there and no fever  or chills she can go home. I will get a KUB to make sure it is visible. If  the stone is visible on KUB and if does not come out and continues to cause  problem, we will consider doing lithotripsy. They understand, and she wants  to go home tonight, so that is okay with me. After KUB, if she is feeling  fine she can go home. I will see her back in the office in two weeks.   FINAL DIAGNOSIS:  Right renal calculus.   DISCHARGE CONDITION:  Improved.   DISCHARGE MEDICATIONS:  Percocet 1 q.6-8h. p.r.n. #30.      Marissa Nestle, M.D.  Electronically Signed     MIJ/MEDQ  D:  07/31/2005  T:  08/01/2005  Job:  [id number]

## 2010-10-07 NOTE — Op Note (Signed)
NAME:  Kristy Jarvis, Kristy Jarvis            ACCOUNT NO.:  [id number]   MEDICAL RECORD NO.:  [id number]          PATIENT TYPE:  AMB   LOCATION:  DAY                           FACILITY:  [facility]   PHYSICIAN:  R. Garfield Cornea, M.D. DATE OF BIRTH:  29-Sep-1920   DATE OF PROCEDURE:  05/23/2005  DATE OF DISCHARGE:                                 OPERATIVE REPORT   PROCEDURE:  Esophagogastroduodenoscopy with Venia Minks dilation.   INDICATIONS FOR PROCEDURE:  The patient is an 77 year old lady with  esophageal dysphagia to solids. Barium pill esophagogram demonstrated  stricture versus ring at EG junction and a hiatal hernia. EGD is now being  done. Esophageal dilation has been discussed with the patient at length.  Potential risks, benefits, and alternatives have been reviewed and questions  answered. Please see the documentation in the medical record.   PROCEDURE NOTE:  O2 saturation, blood pressure, pulse, and respirations were  monitored throughout the entire procedure. Conscious sedation with Versed 3  mg IV and Demerol 25 mg IV, Zofran 4 mg IV prophylactically for nausea for  prior to the procedure. Cetacaine spray for topical oropharyngeal  anesthesia.   INSTRUMENT:  Olympus video chip system.   FINDINGS:  Examination of the tubular esophagus revealed a prominent  Schatzki's ring. Otherwise esophageal mucosa appeared normal. EG junction  was easily traversed with the scope.   Stomach:  Gastric cavity was empty and insufflated well with air. Thorough  examination of gastric mucosa including retroflexed view of the proximal  stomach and esophagogastric junction demonstrated only a hiatal hernia.  Pylorus patent and easily traversed. Examination of bulb and second portion  revealed no abnormalities.   THERAPEUTIC/DIAGNOSTIC MANEUVERS:  A 56-French Maloney dilator was passed to  full insertion. A distinct give was felt upon full insertion. A look back  revealed the ring had been ruptured without  apparent complication. The  patient tolerated the procedure well and was reactive to endoscopy.   IMPRESSION:  1.  Prominent Schatzki's ring, otherwise normal esophagus.  2.  Small to moderate size hiatal hernia. Otherwise normal stomach, normal      D1 and D2.   RECOMMENDATIONS:  1.  Continue Aciphex 20 mg orally indefinitely.  2.  Ms. Vantiem is to let me know if she has any future trouble with      swallowing.      Bridgette Habermann, M.D.  Electronically Signed     RMR/MEDQ  D:  05/23/2005  T:  05/23/2005  Job:  [id number]   cc:   Norwood Levo. Moshe Cipro, M.D.  Fax: [phone]

## 2010-11-15 ENCOUNTER — Other Ambulatory Visit: Payer: Self-pay | Admitting: Family Medicine

## 2010-11-15 DIAGNOSIS — Z139 Encounter for screening, unspecified: Secondary | ICD-10-CM

## 2010-11-28 ENCOUNTER — Ambulatory Visit (HOSPITAL_COMMUNITY)
Admission: RE | Admit: 2010-11-28 | Discharge: 2010-11-28 | Disposition: A | Discharge status: Home / Self Care | Payer: Medicare Other | Source: Ambulatory Visit | Attending: Family Medicine | Admitting: Family Medicine

## 2010-11-28 DIAGNOSIS — Z139 Encounter for screening, unspecified: Secondary | ICD-10-CM

## 2010-11-28 DIAGNOSIS — Z1231 Encounter for screening mammogram for malignant neoplasm of breast: Secondary | ICD-10-CM | POA: Insufficient documentation

## 2011-01-13 ENCOUNTER — Encounter: Payer: Self-pay | Admitting: Family Medicine

## 2011-01-16 ENCOUNTER — Ambulatory Visit (INDEPENDENT_AMBULATORY_CARE_PROVIDER_SITE_OTHER): Payer: Medicare Other | Admitting: Family Medicine

## 2011-01-16 ENCOUNTER — Encounter: Payer: Self-pay | Admitting: Family Medicine

## 2011-01-16 VITALS — BP 130/70 | HR 68 | Ht <= 58 in | Wt 110.0 lb

## 2011-01-16 DIAGNOSIS — K219 Gastro-esophageal reflux disease without esophagitis: Secondary | ICD-10-CM

## 2011-01-16 DIAGNOSIS — N23 Unspecified renal colic: Secondary | ICD-10-CM

## 2011-01-16 DIAGNOSIS — R42 Dizziness and giddiness: Secondary | ICD-10-CM

## 2011-01-16 DIAGNOSIS — R5383 Other fatigue: Secondary | ICD-10-CM

## 2011-01-16 DIAGNOSIS — E785 Hyperlipidemia, unspecified: Secondary | ICD-10-CM

## 2011-01-16 MED ORDER — MECLIZINE HCL 12.5 MG PO TABS: 12.5000 mg | ORAL_TABLET | Freq: Three times a day (TID) | ORAL | Status: DC | PRN

## 2011-01-16 NOTE — Patient Instructions (Signed)
F/u in January  pls call your insurance re the shingles vaccine.  I am sorry that tick bit you, and am thankful this has gotten better.   Appt for flu vaccine in Sept.  Fasting labs end October  Keep well, and call if you need to see me before

## 2011-01-27 NOTE — Progress Notes (Signed)
  Subjective:    Patient ID: Kristy Jarvis, female    DOB: Mar 10, 1921, 77 y.o.   MRN: [id number]  HPI The PT is here for follow up and re-evaluation of chronic medical conditions, medication management and review of any available recent lab and radiology data.  Preventive health is updated, specifically  Cancer screening and Immunization.   Questions or concerns regarding consultations or procedures which the PT has had in the interim are  addressed. The PT denies any adverse reactions to current medications since the last visit.  There are no new concerns.  Had a tick bite approx 3 weeks ago which resulted in cellulitis of the right lower extremity for which she was treated at urgent care, and which has totally resolved.     Review of Systems Denies recent fever or chills. Denies sinus pressure, nasal congestion, ear pain or sore throat. Denies chest congestion, productive cough or wheezing. Denies chest pains, palpitations and leg swelling Denies abdominal pain, nausea, vomiting,diarrhea or constipation.   Denies dysuria, frequency, hesitancy or incontinence. Denies joint pain, swelling and limitation in mobility. Denies headaches, seizures, numbness, or tingling. Denies depression, anxiety or insomnia.        Objective:   Physical Exam   Patient alert and oriented and in no cardiopulmonary distress.  HEENT: No facial asymmetry, EOMI, no sinus tenderness,  oropharynx pink and moist.  Neck supple no adenopathy.  Chest: Clear to auscultation bilaterally.  CVS: S1, S2 no murmurs, no S3.  ABD: Soft non tender. Bowel sounds normal.  Ext: No edema  MS: Adequate though reduced ROM  spine, shoulders, hips and knees.  Skin: Intact, no ulcerations or rash noted.  Psych: Good eye contact, normal affect. Memory intact not anxious or depressed appearing.  CNS: CN 2-12 intact, power, tone and sensation normal throughout.      Assessment & Plan:

## 2011-01-27 NOTE — Assessment & Plan Note (Signed)
No episodes in the past 3 months, antivert for as needed use only

## 2011-01-27 NOTE — Assessment & Plan Note (Signed)
Controlled, no change in medication  

## 2011-01-27 NOTE — Assessment & Plan Note (Signed)
Controlled, no change in medication Will need labs at next visit

## 2011-01-27 NOTE — Assessment & Plan Note (Signed)
No recent flares 

## 2011-02-22 ENCOUNTER — Ambulatory Visit (INDEPENDENT_AMBULATORY_CARE_PROVIDER_SITE_OTHER): Payer: Medicare Other

## 2011-02-22 DIAGNOSIS — Z23 Encounter for immunization: Secondary | ICD-10-CM

## 2011-03-03 LAB — HEPATIC FUNCTION PANEL
ALT: 11 U/L (ref 0–35)
Bilirubin, Direct: 0.1 mg/dL (ref 0.0–0.3)

## 2011-03-03 LAB — LIPID PANEL
Cholesterol: 211 mg/dL — ABNORMAL HIGH (ref 0–200)
LDL Cholesterol: 109 mg/dL — ABNORMAL HIGH (ref 0–99)
Total CHOL/HDL Ratio: 2.9 Ratio
VLDL: 30 mg/dL (ref 0–40)

## 2011-03-03 LAB — CBC WITH DIFFERENTIAL/PLATELET
Eosinophils Absolute: 0.3 10*3/uL (ref 0.0–0.7)
Eosinophils Relative: 5 % (ref 0–5)
HCT: 43.8 % (ref 36.0–46.0)
Hemoglobin: 13.8 g/dL (ref 12.0–15.0)
Lymphs Abs: 3 10*3/uL (ref 0.7–4.0)
MCH: 29.6 pg (ref 26.0–34.0)
MCV: 93.8 fL (ref 78.0–100.0)
Monocytes Absolute: 0.6 10*3/uL (ref 0.1–1.0)
Monocytes Relative: 8 % (ref 3–12)
Neutrophils Relative %: 44 % (ref 43–77)
RBC: 4.67 MIL/uL (ref 3.87–5.11)

## 2011-03-03 LAB — TSH: TSH: 1.536 u[IU]/mL (ref 0.350–4.500)

## 2011-03-03 LAB — BASIC METABOLIC PANEL
BUN: 16 mg/dL (ref 6–23)
Calcium: 9.9 mg/dL (ref 8.4–10.5)
Creat: 0.95 mg/dL (ref 0.50–1.10)

## 2011-05-06 ENCOUNTER — Other Ambulatory Visit: Payer: Self-pay | Admitting: Family Medicine

## 2011-05-08 ENCOUNTER — Other Ambulatory Visit: Payer: Self-pay | Admitting: Family Medicine

## 2011-06-07 ENCOUNTER — Encounter: Payer: Self-pay | Admitting: Family Medicine

## 2011-06-12 ENCOUNTER — Ambulatory Visit (INDEPENDENT_AMBULATORY_CARE_PROVIDER_SITE_OTHER): Payer: Medicare Other | Admitting: Family Medicine

## 2011-06-12 ENCOUNTER — Encounter: Payer: Self-pay | Admitting: Family Medicine

## 2011-06-12 VITALS — BP 126/74 | HR 83 | Resp 18 | Ht <= 58 in | Wt 111.0 lb

## 2011-06-12 DIAGNOSIS — M79609 Pain in unspecified limb: Secondary | ICD-10-CM

## 2011-06-12 DIAGNOSIS — M810 Age-related osteoporosis without current pathological fracture: Secondary | ICD-10-CM

## 2011-06-12 DIAGNOSIS — M79672 Pain in left foot: Secondary | ICD-10-CM | POA: Insufficient documentation

## 2011-06-12 DIAGNOSIS — E785 Hyperlipidemia, unspecified: Secondary | ICD-10-CM

## 2011-06-12 DIAGNOSIS — K219 Gastro-esophageal reflux disease without esophagitis: Secondary | ICD-10-CM

## 2011-06-12 DIAGNOSIS — R42 Dizziness and giddiness: Secondary | ICD-10-CM

## 2011-06-12 MED ORDER — CALCITONIN (SALMON) 200 UNIT/ACT NA SOLN: 1.0000 | Freq: Every day | NASAL | Status: DC

## 2011-06-12 NOTE — Assessment & Plan Note (Signed)
3 month h/o painful wart on sole of left foot, no relief

## 2011-06-12 NOTE — Assessment & Plan Note (Signed)
Pt intolerant of bisphosphonate, she will start calcitonin

## 2011-06-12 NOTE — Patient Instructions (Signed)
F/u in 4.5 month.Rectal at that visit.  Discuss with your children if you really want a colonoscopy,I will continue annual rectal exams  Check with your ins company, we have the shingles vaccine, you need it , we will give it to you here once you let us know.  You are referred to podiatry  New medication , a nasal spray for osteoperosis

## 2011-06-12 NOTE — Assessment & Plan Note (Signed)
Hyperlipidemia:Low fat diet discussed and encouraged.  Continue current med, total and lDL slightly elevated

## 2011-06-12 NOTE — Assessment & Plan Note (Signed)
Controlled, no change in medication  

## 2011-06-17 NOTE — Progress Notes (Signed)
  Subjective:    Patient ID: Kristy Jarvis, female    DOB: 1921/04/22, 78 y.o.   MRN: [id number]  HPI The PT is here for follow up and re-evaluation of chronic medical conditions, medication management and review of any available recent lab and radiology data.  Preventive health is updated, specifically  Cancer screening and Immunization.   Questions or concerns regarding consultations or procedures which the PT has had in the interim are  addressed. The PT denies any adverse reactions to current medications since the last visit.  There are no new concerns.   C/o painful lesion on sole of left foot which she has been working on for some time with no relief   Review of Systems See HPI Denies recent fever or chills. Denies sinus pressure, nasal congestion, ear pain or sore throat. Denies chest congestion, productive cough or wheezing. Denies chest pains, palpitations and leg swelling Denies abdominal pain, nausea, vomiting,diarrhea or constipation.   Denies dysuria, frequency, hesitancy or incontinence. Denies joint pain, swelling and limitation in mobility. Denies headaches, seizures, numbness, or tingling. Denies depression, anxiety or insomnia. Denies skin break down or rash.        Objective:   Physical Exam Patient alert and oriented and in no cardiopulmonary distress.  HEENT: No facial asymmetry, EOMI, no sinus tenderness,  oropharynx pink and moist.  Neck supple no adenopathy.  Chest: Clear to auscultation bilaterally.  CVS: S1, S2 no murmurs, no S3.  ABD: Soft non tender. Bowel sounds normal.  Ext: No edema  MS: Adequate ROM spine, shoulders, hips and knees.  Skin: No rash, wart on sole of left foot  Psych: Good eye contact, normal affect. Memory intact not anxious or depressed appearing.  CNS: CN 2-12 intact, power, tone and sensation normal throughout.        Assessment & Plan:

## 2011-06-17 NOTE — Assessment & Plan Note (Signed)
No flare in the past 2 months, has antivert for as needed use

## 2011-06-27 HISTORY — PX: BUNIONECTOMY: SHX129

## 2011-08-30 ENCOUNTER — Encounter: Payer: Self-pay | Admitting: Gastroenterology

## 2011-08-31 ENCOUNTER — Encounter: Payer: Self-pay | Admitting: Gastroenterology

## 2011-08-31 ENCOUNTER — Ambulatory Visit (INDEPENDENT_AMBULATORY_CARE_PROVIDER_SITE_OTHER): Payer: Medicare Other | Admitting: Gastroenterology

## 2011-08-31 VITALS — BP 122/66 | HR 68 | Temp 98.0°F | Ht 61.0 in | Wt 112.2 lb

## 2011-08-31 DIAGNOSIS — K219 Gastro-esophageal reflux disease without esophagitis: Secondary | ICD-10-CM

## 2011-08-31 DIAGNOSIS — R1310 Dysphagia, unspecified: Secondary | ICD-10-CM

## 2011-08-31 DIAGNOSIS — R1314 Dysphagia, pharyngoesophageal phase: Secondary | ICD-10-CM

## 2011-08-31 NOTE — Progress Notes (Signed)
Primary Care Physician:  Tula Nakayama, MD, MD  Primary Gastroenterologist:  Garfield Cornea, MD   Chief Complaint  Patient presents with  . Dysphagia    HPI:  Kristy Jarvis is a 78 y.o. female here for further evaluation of pp epigastric pain and sensation that food is not going down well when she swallows. Feels like food sticking. Takes one hour or more for symptoms to settle down. Lots of gas trapping, feels better with belching. No heartburn on omeprazole. BM regular. No melena, brbpr. No prior colonoscopy. Dr. Berrones Dakin advised against given her age. Patient not really interested.   Current Outpatient Prescriptions  Medication Sig Dispense Refill  . aspirin (ASPIRIN LOW DOSE) 81 MG EC tablet Take 81 mg by mouth daily.        . calcitonin, salmon, (MIACALCIN) 200 UNIT/ACT nasal spray Place 1 spray into the nose daily.  3.7 mL  3  . calcium carbonate (TUMS - DOSED IN MG ELEMENTAL CALCIUM) 500 MG chewable tablet Chew 750 mg by mouth 2 (two) times daily.       . Cholecalciferol (VITAMIN D) 400 UNITS capsule Take 400 Units by mouth 2 (two) times daily.       Marland Kitchen lovastatin (MEVACOR) 40 MG tablet TAKE 1 TABLET BY MOUTH AT BEDTIME  30 tablet  3  . meclizine (ANTIVERT) 12.5 MG tablet Take 1 tablet (12.5 mg total) by mouth 3 (three) times daily as needed. For dizziness    30 tablet  1  . omeprazole (PRILOSEC) 20 MG capsule take 1 capsule by mouth once daily  30 capsule  3    Allergies as of 08/31/2011 - Review Complete 08/31/2011  Allergen Reaction Noted  . Alendronate sodium  11/03/2009  . Penicillins  08/13/2007    Past Medical History  Diagnosis Date  . Intermittent vertigo   . Renal colic   . Osteoporosis   . Hyperlipidemia   . GERD (gastroesophageal reflux disease)     Past Surgical History  Procedure Date  . Cataract extraction 2002    left eye   . Cholecystectomy 1989  . Abdominal hysterectomy   . Esophagogastroduodenoscopy Jan  2007    prominent schatzki's ring,  s/p 36F, small to moderate size hh    Family History  Problem Relation Age of Onset  . Heart attack Father   . Diabetes Sister   . Liver cancer      family history     History   Social History  . Marital Status: Widowed    Spouse Name: N/A    Number of Children: 1  . Years of Education: N/A   Occupational History  . retired     Social History Main Topics  . Smoking status: Never Smoker   . Smokeless tobacco: Not on file  . Alcohol Use: No  . Drug Use: No  . Sexually Active: Not on file   Other Topics Concern  . Not on file   Social History Narrative  . No narrative on file      ROS:  General: Negative for anorexia, weight loss, fever, chills, fatigue, weakness. Eyes: Negative for vision changes.  ENT: Negative for hoarseness, difficulty swallowing , nasal congestion. CV: Negative for chest pain, angina, palpitations, dyspnea on exertion, peripheral edema.  Respiratory: Negative for dyspnea at rest, dyspnea on exertion, cough, sputum, wheezing.  GI: See history of present illness. GU:  Negative for dysuria, hematuria, urinary incontinence, urinary frequency, nocturnal urination.  MS: Negative for joint pain,  low back pain.  Derm: Negative for rash or itching.  Neuro: Negative for weakness, abnormal sensation, seizure, frequent headaches, memory loss, confusion.  Psych: Negative for anxiety, depression, suicidal ideation, hallucinations.  Endo: Negative for unusual weight change.  Heme: Negative for bruising or bleeding. Allergy: Negative for rash or hives.    Physical Examination:  BP 122/66  Pulse 68  Temp(Src) 98 F (36.7 C) (Temporal)  Ht 5\' 1"  (1.549 m)  Wt 112 lb 3.2 oz (50.894 kg)  BMI 21.20 kg/m2   General: Well-nourished, well-developed in no acute distress.  Head: Normocephalic, atraumatic.   Eyes: Conjunctiva pink, no icterus. Mouth: Oropharyngeal mucosa moist and pink , no lesions erythema or exudate. Neck: Supple without thyromegaly,  masses, or lymphadenopathy.  Lungs: Clear to auscultation bilaterally.  Heart: Regular rate and rhythm, no murmurs rubs or gallops.  Abdomen: Bowel sounds are normal, nontender, nondistended, no hepatosplenomegaly or masses, no abdominal bruits or    hernia , no rebound or guarding.   Rectal: not performed Extremities: No lower extremity edema. No clubbing or deformities.  Neuro: Alert and oriented x 4 , grossly normal neurologically.  Skin: Warm and dry, no rash or jaundice.   Psych: Alert and cooperative, normal mood and affect.  Labs: Lab Results  Component Value Date   WBC 7.1 03/03/2011   HGB 13.8 03/03/2011   HCT 43.8 03/03/2011   MCV 93.8 03/03/2011   PLT 251 03/03/2011   Lab Results  Component Value Date   CREATININE 0.95 03/03/2011   BUN 16 03/03/2011   NA 145 03/03/2011   K 4.7 03/03/2011   CL 106 03/03/2011   CO2 28 03/03/2011   Lab Results  Component Value Date   ALT 11 03/03/2011   AST 24 03/03/2011   ALKPHOS 82 03/03/2011   BILITOT 0.5 03/03/2011     Imaging Studies: CT A/P done last year (08/2010) shoed moderate to large hiatal hernia, mild sigmoid diverticulosis.

## 2011-08-31 NOTE — Assessment & Plan Note (Signed)
Patient with moderately large hiatal hernia on CT in 2012. Small to moderate in size on EGD 2007. C/O esophageal dysphagia and pp dyspepsia. Suspect esophageal ring and symptomatic hiatal hernia. EGD/ED in near future.  I have discussed the risks, alternatives, benefits with regards to but not limited to the risk of reaction to medication, bleeding, infection, perforation and the patient is agreeable to proceed. Written consent to be obtained.   Of note, patient is not interested in colonoscopy given her age. She has never had one. Mother lived to be 79. Multiple siblings in their 41s. No FH of CRC.

## 2011-08-31 NOTE — Patient Instructions (Signed)
We have scheduled you for an upper endoscopy. Please see separate instructions.

## 2011-09-11 ENCOUNTER — Other Ambulatory Visit: Payer: Self-pay | Admitting: Family Medicine

## 2011-09-21 ENCOUNTER — Encounter (HOSPITAL_COMMUNITY): Payer: Self-pay | Admitting: Pharmacy Technician

## 2011-09-22 MED ORDER — SODIUM CHLORIDE 0.45 % IV SOLN
Freq: Once | INTRAVENOUS | Status: AC
  Administered 2011-09-25: 09:00:00 via INTRAVENOUS

## 2011-09-25 ENCOUNTER — Ambulatory Visit (HOSPITAL_COMMUNITY)
Admission: RE | Admit: 2011-09-25 | Discharge: 2011-09-25 | Disposition: A | Discharge status: Home Health | Payer: Medicare Other | Source: Ambulatory Visit | Attending: Internal Medicine | Admitting: Internal Medicine

## 2011-09-25 ENCOUNTER — Encounter (HOSPITAL_COMMUNITY)
Admission: RE | Disposition: A | Discharge status: Home Health | Payer: Self-pay | Source: Ambulatory Visit | Attending: Internal Medicine

## 2011-09-25 ENCOUNTER — Encounter (HOSPITAL_COMMUNITY): Payer: Self-pay

## 2011-09-25 DIAGNOSIS — Z7982 Long term (current) use of aspirin: Secondary | ICD-10-CM | POA: Insufficient documentation

## 2011-09-25 DIAGNOSIS — D131 Benign neoplasm of stomach: Secondary | ICD-10-CM | POA: Insufficient documentation

## 2011-09-25 DIAGNOSIS — R1310 Dysphagia, unspecified: Secondary | ICD-10-CM

## 2011-09-25 DIAGNOSIS — K222 Esophageal obstruction: Secondary | ICD-10-CM

## 2011-09-25 DIAGNOSIS — K449 Diaphragmatic hernia without obstruction or gangrene: Secondary | ICD-10-CM | POA: Insufficient documentation

## 2011-09-25 DIAGNOSIS — E785 Hyperlipidemia, unspecified: Secondary | ICD-10-CM | POA: Insufficient documentation

## 2011-09-25 SURGERY — ESOPHAGOGASTRODUODENOSCOPY (EGD) WITH ESOPHAGEAL DILATION
Anesthesia: Moderate Sedation

## 2011-09-25 MED ORDER — STERILE WATER FOR IRRIGATION IR SOLN
Status: DC | PRN
  Administered 2011-09-25: 09:00:00

## 2011-09-25 MED ORDER — MIDAZOLAM HCL 5 MG/5ML IJ SOLN
INTRAMUSCULAR | Status: DC | PRN
  Administered 2011-09-25 (×2): 1 mg via INTRAVENOUS

## 2011-09-25 MED ORDER — ONDANSETRON 4 MG PO TBDP
4.0000 mg | ORAL_TABLET | Freq: Once | ORAL | Status: AC
  Administered 2011-09-25: 4 mg via ORAL

## 2011-09-25 MED ORDER — MIDAZOLAM HCL 5 MG/5ML IJ SOLN
INTRAMUSCULAR | Status: AC
  Filled 2011-09-25: qty 10

## 2011-09-25 MED ORDER — ONDANSETRON 4 MG PO TBDP
ORAL_TABLET | ORAL | Status: AC
  Filled 2011-09-25: qty 1

## 2011-09-25 MED ORDER — BUTAMBEN-TETRACAINE-BENZOCAINE 2-2-14 % EX AERO
INHALATION_SPRAY | CUTANEOUS | Status: DC | PRN
  Administered 2011-09-25: 2 via TOPICAL

## 2011-09-25 MED ORDER — MEPERIDINE HCL 100 MG/ML IJ SOLN
INTRAMUSCULAR | Status: DC | PRN
  Administered 2011-09-25 (×2): 25 mg via INTRAVENOUS

## 2011-09-25 MED ORDER — MEPERIDINE HCL 100 MG/ML IJ SOLN
INTRAMUSCULAR | Status: AC
  Filled 2011-09-25: qty 1

## 2011-09-25 NOTE — OR Nursing (Signed)
Pt states she felt her mouth feeling "droopy" and her eye feeling droopy.  Dr Gala Romney made aware.  Pt speech normal; could raise arms equally, could squeeze hands equally.  Face appears symmetrical.  Dr Gala Romney examined pt and agreeable to proceed with case.

## 2011-09-25 NOTE — Op Note (Signed)
Kristy Jarvis, Kristy Jarvis            ACCOUNT NO.:  [id number]  MEDICAL RECORD NO.:  [id number]  LOCATION:  [facility]                          FACILITY:  [facility]  PHYSICIAN:  R. Garfield Cornea, MD FACP FACGDATE OF BIRTH:  1920/08/20  DATE OF PROCEDURE:  09/25/2011 DATE OF DISCHARGE:                              OPERATIVE REPORT   INDICATIONS FOR PROCEDURE:  An 78 year old lady with recurrent esophageal dysphagia in the segment of Schatzki's ring.  EGD with esophageal dilation as appropriate now being done.  Risks, benefits, limitations, imponderables, and alternatives have been reviewed. Questions have been answered, all parties agreeable.  Please see the documentation medical record.  SURGEON:  R. Garfield Cornea, MD, Rosalita Chessman, [facility]  PROCEDURE NOTE:  O2 saturation, blood pressure, pulse, respirations were monitored throughout the entirety of the procedure.  CONSCIOUS SEDATION:  Versed 2 mg IV, Demerol 50 mg IV in divided doses. Cetacaine spray for topical pharyngeal anesthesia.  INSTRUMENT:  Pentax video chip system.  FINDINGS:  Examination of the tubular esophagus revealed a prominent Schatzki's ring, however, the esophageal lumen was patent through the EG junction.  There was no esophagitis or Barrett esophagus.  No tumor.  Stomach:  Gastric cavity was emptied and insufflated well with air. Thorough exam of the gastric mucosa including retroflexion of the proximal stomach and esophagogastric junction demonstrated a moderate- sized hiatal hernia and multiple 1 to 3 mm fundal gland appearing polyps.  Pylorus was patent, easily traversed.  Examination of the bulb and second portion revealed no abnormalities.  Therapeutic/diagnostic maneuvers were performed.  Scope was withdrawn.  A 56-French Maloney dilator was passed fully with ease.  Looking back revealed the ring remained intact.  Subsequently, using Jumbo biopsy forceps, 3 quadrant "bites" were taken and remain to additionally disrupted.   This was done without difficulty or apparent complication.  The patient tolerated the procedure well and was reactive to endoscopy.  IMPRESSION: 1. Prominent Schatzki ring status post dilation and disruption as     described above. 2 . Moderate-sized hiatal hernia. 1. Multiple fundal polyps - not manipulated.  Remainder of stomach     appeared normal.  D1 and D2 appeared normal.  RECOMMENDATIONS: 1. Continue omeprazole 20 mg orally daily. 2. Follow up with Dr. Moshe Cipro as scheduled.     Bridgette Habermann, MD FACP [facility]     RMR/MEDQ  D:  09/25/2011  T:  09/25/2011  Job:  [id number]  cc:   Dr. Moshe Cipro

## 2011-09-25 NOTE — H&P (View-Only) (Signed)
Primary Care Physician:  Tula Nakayama, MD, MD  Primary Gastroenterologist:  Garfield Cornea, MD   Chief Complaint  Patient presents with  . Dysphagia    HPI:  Kristy Jarvis is a 78 y.o. female here for further evaluation of pp epigastric pain and sensation that food is not going down well when she swallows. Feels like food sticking. Takes one hour or more for symptoms to settle down. Lots of gas trapping, feels better with belching. No heartburn on omeprazole. BM regular. No melena, brbpr. No prior colonoscopy. Dr. Henderson Dakin advised against given her age. Patient not really interested.   Current Outpatient Prescriptions  Medication Sig Dispense Refill  . aspirin (ASPIRIN LOW DOSE) 81 MG EC tablet Take 81 mg by mouth daily.        . calcitonin, salmon, (MIACALCIN) 200 UNIT/ACT nasal spray Place 1 spray into the nose daily.  3.7 mL  3  . calcium carbonate (TUMS - DOSED IN MG ELEMENTAL CALCIUM) 500 MG chewable tablet Chew 750 mg by mouth 2 (two) times daily.       . Cholecalciferol (VITAMIN D) 400 UNITS capsule Take 400 Units by mouth 2 (two) times daily.       Marland Kitchen lovastatin (MEVACOR) 40 MG tablet TAKE 1 TABLET BY MOUTH AT BEDTIME  30 tablet  3  . meclizine (ANTIVERT) 12.5 MG tablet Take 1 tablet (12.5 mg total) by mouth 3 (three) times daily as needed. For dizziness    30 tablet  1  . omeprazole (PRILOSEC) 20 MG capsule take 1 capsule by mouth once daily  30 capsule  3    Allergies as of 08/31/2011 - Review Complete 08/31/2011  Allergen Reaction Noted  . Alendronate sodium  11/03/2009  . Penicillins  08/13/2007    Past Medical History  Diagnosis Date  . Intermittent vertigo   . Renal colic   . Osteoporosis   . Hyperlipidemia   . GERD (gastroesophageal reflux disease)     Past Surgical History  Procedure Date  . Cataract extraction 2002    left eye   . Cholecystectomy 1989  . Abdominal hysterectomy   . Esophagogastroduodenoscopy Jan  2007    prominent schatzki's ring,  s/p 36F, small to moderate size hh    Family History  Problem Relation Age of Onset  . Heart attack Father   . Diabetes Sister   . Liver cancer      family history     History   Social History  . Marital Status: Widowed    Spouse Name: N/A    Number of Children: 1  . Years of Education: N/A   Occupational History  . retired     Social History Main Topics  . Smoking status: Never Smoker   . Smokeless tobacco: Not on file  . Alcohol Use: No  . Drug Use: No  . Sexually Active: Not on file   Other Topics Concern  . Not on file   Social History Narrative  . No narrative on file      ROS:  General: Negative for anorexia, weight loss, fever, chills, fatigue, weakness. Eyes: Negative for vision changes.  ENT: Negative for hoarseness, difficulty swallowing , nasal congestion. CV: Negative for chest pain, angina, palpitations, dyspnea on exertion, peripheral edema.  Respiratory: Negative for dyspnea at rest, dyspnea on exertion, cough, sputum, wheezing.  GI: See history of present illness. GU:  Negative for dysuria, hematuria, urinary incontinence, urinary frequency, nocturnal urination.  MS: Negative for joint pain,  low back pain.  Derm: Negative for rash or itching.  Neuro: Negative for weakness, abnormal sensation, seizure, frequent headaches, memory loss, confusion.  Psych: Negative for anxiety, depression, suicidal ideation, hallucinations.  Endo: Negative for unusual weight change.  Heme: Negative for bruising or bleeding. Allergy: Negative for rash or hives.    Physical Examination:  BP 122/66  Pulse 68  Temp(Src) 98 F (36.7 C) (Temporal)  Ht 5\' 1"  (1.549 m)  Wt 112 lb 3.2 oz (50.894 kg)  BMI 21.20 kg/m2   General: Well-nourished, well-developed in no acute distress.  Head: Normocephalic, atraumatic.   Eyes: Conjunctiva pink, no icterus. Mouth: Oropharyngeal mucosa moist and pink , no lesions erythema or exudate. Neck: Supple without thyromegaly,  masses, or lymphadenopathy.  Lungs: Clear to auscultation bilaterally.  Heart: Regular rate and rhythm, no murmurs rubs or gallops.  Abdomen: Bowel sounds are normal, nontender, nondistended, no hepatosplenomegaly or masses, no abdominal bruits or    hernia , no rebound or guarding.   Rectal: not performed Extremities: No lower extremity edema. No clubbing or deformities.  Neuro: Alert and oriented x 4 , grossly normal neurologically.  Skin: Warm and dry, no rash or jaundice.   Psych: Alert and cooperative, normal mood and affect.  Labs: Lab Results  Component Value Date   WBC 7.1 03/03/2011   HGB 13.8 03/03/2011   HCT 43.8 03/03/2011   MCV 93.8 03/03/2011   PLT 251 03/03/2011   Lab Results  Component Value Date   CREATININE 0.95 03/03/2011   BUN 16 03/03/2011   NA 145 03/03/2011   K 4.7 03/03/2011   CL 106 03/03/2011   CO2 28 03/03/2011   Lab Results  Component Value Date   ALT 11 03/03/2011   AST 24 03/03/2011   ALKPHOS 82 03/03/2011   BILITOT 0.5 03/03/2011     Imaging Studies: CT A/P done last year (08/2010) shoed moderate to large hiatal hernia, mild sigmoid diverticulosis.

## 2011-09-25 NOTE — Interval H&P Note (Signed)
History and Physical Interval Note:  09/25/2011 9:05 AM  Kristy Jarvis  has presented today for surgery, with the diagnosis of dysphagia  The various methods of treatment have been discussed with the patient and family. After consideration of risks, benefits and other options for treatment, the patient has consented to  Procedure(s) (LRB): ESOPHAGOGASTRODUODENOSCOPY (EGD) WITH ESOPHAGEAL DILATION (N/A) as a surgical intervention .  The patients' history has been reviewed, patient examined, no change in status, stable for surgery.  I have reviewed the patients' chart and labs.  Questions were answered to the patient's satisfaction.     Manus Rudd

## 2011-09-25 NOTE — Discharge Instructions (Addendum)
EGD Discharge instructions Please read the instructions outlined below and refer to this sheet in the next few weeks. These discharge instructions provide you with general information on caring for yourself after you leave the hospital. Your doctor may also give you specific instructions. While your treatment has been planned according to the most current medical practices available, unavoidable complications occasionally occur. If you have any problems or questions after discharge, please call your doctor. ACTIVITY  You may resume your regular activity but move at a slower pace for the next 24 hours.   Take frequent rest periods for the next 24 hours.   Walking will help expel (get rid of) the air and reduce the bloated feeling in your abdomen.   No driving for 24 hours (because of the anesthesia (medicine) used during the test).   You may shower.   Do not sign any important legal documents or operate any machinery for 24 hours (because of the anesthesia used during the test).  NUTRITION  Drink plenty of fluids.   You may resume your normal diet.   Begin with a light meal and progress to your normal diet.   Avoid alcoholic beverages for 24 hours or as instructed by your caregiver.  MEDICATIONS  You may resume your normal medications unless your caregiver tells you otherwise.  WHAT YOU CAN EXPECT TODAY  You may experience abdominal discomfort such as a feeling of fullness or "gas" pains.  FOLLOW-UP  Your doctor will discuss the results of your test with you.  SEEK IMMEDIATE MEDICAL ATTENTION IF ANY OF THE FOLLOWING OCCUR:  Excessive nausea (feeling sick to your stomach) and/or vomiting.   Severe abdominal pain and distention (swelling).   Trouble swallowing.   Temperature over 101 F (37.8 C).   Rectal bleeding or vomiting of blood.    Continue omeprazole 20 mg daily. Followup with Dr. Moshe Cipro as needed

## 2011-10-25 ENCOUNTER — Encounter: Payer: Self-pay | Admitting: Family Medicine

## 2011-10-25 ENCOUNTER — Ambulatory Visit (INDEPENDENT_AMBULATORY_CARE_PROVIDER_SITE_OTHER): Payer: Medicare Other | Admitting: Family Medicine

## 2011-10-25 VITALS — BP 138/80 | HR 65 | Resp 16 | Ht 61.0 in | Wt 109.0 lb

## 2011-10-25 DIAGNOSIS — K219 Gastro-esophageal reflux disease without esophagitis: Secondary | ICD-10-CM

## 2011-10-25 DIAGNOSIS — R42 Dizziness and giddiness: Secondary | ICD-10-CM

## 2011-10-25 DIAGNOSIS — E785 Hyperlipidemia, unspecified: Secondary | ICD-10-CM

## 2011-10-25 NOTE — Progress Notes (Signed)
  Subjective:    Patient ID: Kristy Jarvis, female    DOB: Dec 02, 1920, 78 y.o.   MRN: [id number]  HPI The PT is here for follow up and re-evaluation of chronic medical conditions, medication management and review of any available recent lab and radiology data.  Preventive health is updated, specifically  Cancer screening and Immunization.   Questions or concerns regarding consultations or procedures which the PT has had in the interim are  addressed. The PT denies any adverse reactions to current medications since the last visit.  There are no new concerns.  C/o feeling bloated often, wonders if sh will benefit from surgery, a family member recently had to have emergency  Surgery for hiatal hernia so she has become somewhat mor concerned as to whether she needs surgery. Also feels as though food is sticking. Of note her weight is essentially stable    Review of Systems    See HPI Denies recent fever or chills. Denies sinus pressure, nasal congestion, ear pain or sore throat. Denies chest congestion, productive cough or wheezing. Denies chest pains, palpitations and leg swelling    Denies dysuria, frequency, hesitancy or incontinence. Denies joint pain, swelling and limitation in mobility. Denies headaches, seizures, numbness, or tingling. Denies depression,mild  anxiety over intestinal symptoms Denies skin break down or rash.     Objective:   Physical Exam  Patient alert and oriented and in no cardiopulmonary distress.  HEENT: No facial asymmetry, EOMI, no sinus tenderness,  oropharynx pink and moist.  Neck supple no adenopathy.  Chest: Clear to auscultation bilaterally.  CVS: S1, S2 no murmurs, no S3.  ABD: Soft mild epigastric tenderness on palpation, no palpable organomegaly or masses  Ext: No edema  MS: Adequate ROM spine, shoulders, hips and knees.  Skin: Intact, no ulcerations or rash noted.  Psych: Good eye contact, normal affect. Memory intact not anxious  or depressed appearing.  CNS: CN 2-12 intact, power, tone and sensation normal throughout.       Assessment & Plan:

## 2011-10-25 NOTE — Patient Instructions (Signed)
F/u in 4 month, call if you need me before please  Fasting lipid and hepatic as soon as possible , past due.  Continue current medication.  I will call if surgeon thinks there will be any benefit in surgery for your hernia.

## 2011-10-26 LAB — LIPID PANEL
HDL: 66 mg/dL (ref >39)
LDL Cholesterol: 79 mg/dL (ref 0–99)
Total CHOL/HDL Ratio: 2.5 Ratio
VLDL: 17 mg/dL (ref 0–40)

## 2011-10-26 LAB — HEPATIC FUNCTION PANEL
Albumin: 3.9 g/dL (ref 3.5–5.2)
Total Bilirubin: 0.5 mg/dL (ref 0.3–1.2)

## 2011-10-29 NOTE — Assessment & Plan Note (Signed)
Recent episode in the past month, short lived, needs medication to be available for as needed use

## 2011-10-29 NOTE — Assessment & Plan Note (Signed)
Updated labs reveal excellent control, no med change

## 2011-10-29 NOTE — Assessment & Plan Note (Addendum)
Symptomatic GERD with hiatal hernia, will refer for surgical evaluation since still very symptomatic following recent EGD and dilatation. I did explain to pt that all surgery has risk ivolved, and there would have to be serious consideration for risk /benefit ratio. The recent emergency surgery needed by a family member for similar complaint reportedly has increased her anxiety about the need to intervene. The need for surgical consult with he family is I believe the best course of action and I will call her back to make her aware of this decision

## 2011-11-01 ENCOUNTER — Other Ambulatory Visit: Payer: Self-pay | Admitting: Family Medicine

## 2011-11-01 DIAGNOSIS — Z139 Encounter for screening, unspecified: Secondary | ICD-10-CM

## 2011-11-30 ENCOUNTER — Ambulatory Visit (HOSPITAL_COMMUNITY)
Admission: RE | Admit: 2011-11-30 | Discharge: 2011-11-30 | Disposition: A | Discharge status: Home / Self Care | Payer: Medicare Other | Source: Ambulatory Visit | Attending: Family Medicine | Admitting: Family Medicine

## 2011-11-30 DIAGNOSIS — Z1231 Encounter for screening mammogram for malignant neoplasm of breast: Secondary | ICD-10-CM | POA: Insufficient documentation

## 2011-11-30 DIAGNOSIS — Z139 Encounter for screening, unspecified: Secondary | ICD-10-CM

## 2012-01-10 ENCOUNTER — Other Ambulatory Visit: Payer: Self-pay | Admitting: Family Medicine

## 2012-02-14 ENCOUNTER — Encounter: Payer: Self-pay | Admitting: Family Medicine

## 2012-02-14 ENCOUNTER — Ambulatory Visit (INDEPENDENT_AMBULATORY_CARE_PROVIDER_SITE_OTHER): Payer: Medicare Other | Admitting: Family Medicine

## 2012-02-14 VITALS — BP 150/82 | HR 82 | Resp 15 | Ht 61.0 in | Wt 108.0 lb

## 2012-02-14 DIAGNOSIS — Z23 Encounter for immunization: Secondary | ICD-10-CM

## 2012-02-14 DIAGNOSIS — M810 Age-related osteoporosis without current pathological fracture: Secondary | ICD-10-CM

## 2012-02-14 DIAGNOSIS — R5383 Other fatigue: Secondary | ICD-10-CM

## 2012-02-14 DIAGNOSIS — Z1211 Encounter for screening for malignant neoplasm of colon: Secondary | ICD-10-CM

## 2012-02-14 DIAGNOSIS — K219 Gastro-esophageal reflux disease without esophagitis: Secondary | ICD-10-CM

## 2012-02-14 DIAGNOSIS — E785 Hyperlipidemia, unspecified: Secondary | ICD-10-CM

## 2012-02-14 NOTE — Patient Instructions (Addendum)
Annual wellness in January, mid, please call if you need me before.  i am happy your eye exam went well, and swallowing is improved  Fasting cbc, lipid, cmp, mid January before visit  Flu vaccine today.   No med changes at this time, though your blood pressure is slightly high today. Cut back on salt and eat mainly fresh or frozen  fruit and vegetable  Rectal exam today is normal

## 2012-02-14 NOTE — Progress Notes (Signed)
  Subjective:    Patient ID: Kristy Jarvis, female    DOB: 12/09/1920, 78 y.o.   MRN: [id number]  HPI The PT is here for follow up and re-evaluation of chronic medical conditions, medication management and review of any available recent lab and radiology data.  Preventive health is updated, specifically  Cancer screening and Immunization.   Questions or concerns regarding consultations or procedures which the PT has had in the interim are  Addressed.Has been seen by surgery re hiatal hernia, no repair recommended, risk outweighing benefits at this time. Has seen podiatry, re bunion, and also has had eye exam The PT denies any adverse reactions to current medications since the last visit.  There are no new concerns.  There are no specific complaints       Review of Systems See HPI Denies recent fever or chills. Denies sinus pressure, nasal congestion, ear pain or sore throat. Denies chest congestion, productive cough or wheezing. Denies chest pains, palpitations and leg swelling Denies abdominal pain, nausea, vomiting,diarrhea or constipation.   Denies dysuria, frequency, hesitancy or incontinence. Intermittent mild  joint pain,  And no  limitation in mobility. Denies headaches, seizures, numbness, or tingling. Denies depression, anxiety or insomnia. Denies skin break down or rash.        Objective:   Physical Exam  Patient alert and oriented and in no cardiopulmonary distress.  HEENT: No facial asymmetry, EOMI, no sinus tenderness,  oropharynx pink and moist.  Neck decreased ROM, no adenopathy.  Chest: Clear to auscultation bilaterally.  CVS: S1, S2 no murmurs, no S3.  ABD: Soft non tender. Bowel sounds normal.  Ext: No edema  MS: Adequate though reduced  ROM spine, shoulders, hips and knees.  Skin: Intact, no ulcerations or rash noted.  Psych: Good eye contact, normal affect. Memory intact not anxious or depressed appearing.  CNS: CN 2-12 intact, power, tone  and sensation normal throughout.       Assessment & Plan:

## 2012-02-18 NOTE — Assessment & Plan Note (Signed)
Controlled, no change in medication  

## 2012-02-18 NOTE — Assessment & Plan Note (Signed)
Importance of calcium and vit D as well as physical activity with weights stressed. Intolerant of bisphosphonates

## 2012-02-18 NOTE — Assessment & Plan Note (Signed)
Controlled, no change in medication Hyperlipidemia:Low fat diet discussed and encouraged.  \ 

## 2012-04-22 ENCOUNTER — Other Ambulatory Visit: Payer: Self-pay

## 2012-04-22 ENCOUNTER — Other Ambulatory Visit: Payer: Self-pay | Admitting: Family Medicine

## 2012-04-22 MED ORDER — MECLIZINE HCL 12.5 MG PO TABS: 12.5000 mg | ORAL_TABLET | Freq: Three times a day (TID) | ORAL | Status: DC | PRN

## 2012-05-10 ENCOUNTER — Other Ambulatory Visit: Payer: Self-pay | Admitting: Family Medicine

## 2012-05-23 ENCOUNTER — Other Ambulatory Visit: Payer: Self-pay | Admitting: Family Medicine

## 2012-05-27 ENCOUNTER — Telehealth: Payer: Self-pay | Admitting: Family Medicine

## 2012-05-27 NOTE — Telephone Encounter (Signed)
Med refilled 1/2

## 2012-06-03 LAB — COMPREHENSIVE METABOLIC PANEL
AST: 21 U/L (ref 0–37)
Albumin: 4 g/dL (ref 3.5–5.2)
Alkaline Phosphatase: 94 U/L (ref 39–117)
Calcium: 10 mg/dL (ref 8.4–10.5)
Chloride: 107 mEq/L (ref 96–112)
Glucose, Bld: 98 mg/dL (ref 70–99)
Potassium: 5.1 mEq/L (ref 3.5–5.3)
Sodium: 143 mEq/L (ref 135–145)
Total Protein: 6.3 g/dL (ref 6.0–8.3)

## 2012-06-03 LAB — LIPID PANEL
Cholesterol: 180 mg/dL (ref 0–200)
HDL: 73 mg/dL (ref >39)
Triglycerides: 1289 mg/dL — ABNORMAL HIGH (ref <150)

## 2012-06-03 LAB — CBC WITH DIFFERENTIAL/PLATELET
Basophils Absolute: 0.1 10*3/uL (ref 0.0–0.1)
HCT: 43.9 % (ref 36.0–46.0)
Hemoglobin: 15.1 g/dL — ABNORMAL HIGH (ref 12.0–15.0)
Lymphocytes Relative: 41 % (ref 12–46)
Monocytes Absolute: 0.5 10*3/uL (ref 0.1–1.0)
Monocytes Relative: 8 % (ref 3–12)
Neutro Abs: 2.7 10*3/uL (ref 1.7–7.7)
WBC: 6.2 10*3/uL (ref 4.0–10.5)

## 2012-06-12 ENCOUNTER — Ambulatory Visit (INDEPENDENT_AMBULATORY_CARE_PROVIDER_SITE_OTHER): Payer: Medicare Other | Admitting: Family Medicine

## 2012-06-12 ENCOUNTER — Ambulatory Visit: Payer: Medicare Other | Admitting: Family Medicine

## 2012-06-12 ENCOUNTER — Encounter: Payer: Self-pay | Admitting: Family Medicine

## 2012-06-12 VITALS — BP 118/62 | HR 62 | Resp 18 | Ht 61.0 in | Wt 108.0 lb

## 2012-06-12 DIAGNOSIS — E785 Hyperlipidemia, unspecified: Secondary | ICD-10-CM

## 2012-06-12 DIAGNOSIS — K219 Gastro-esophageal reflux disease without esophagitis: Secondary | ICD-10-CM

## 2012-06-12 MED ORDER — LOVASTATIN 40 MG PO TABS: 40.0000 mg | ORAL_TABLET | Freq: Every day | ORAL | Status: DC

## 2012-06-12 MED ORDER — OMEPRAZOLE 20 MG PO CPDR: 20.0000 mg | DELAYED_RELEASE_CAPSULE | Freq: Every day | ORAL | Status: DC

## 2012-06-12 NOTE — Progress Notes (Signed)
  Subjective:    Patient ID: Kristy Jarvis, female    DOB: 08/17/1920, 79 y.o.   MRN: [id number]  HPI The PT is here for follow up and re-evaluation of chronic medical conditions, medication management and review of any available recent lab and radiology data.  Preventive health is updated, specifically  Cancer screening and Immunization.   Questions or concerns regarding consultations or procedures which the PT has had in the interim are  addressed. The PT denies any adverse reactions to current medications since the last visit.  There are no new concerns.  There are no specific complaints       Review of Systems See HPI Denies recent fever or chills. Denies sinus pressure, nasal congestion, ear pain or sore throat. Denies chest congestion, productive cough or wheezing. Denies chest pains, palpitations and leg swelling Denies abdominal pain, nausea, vomiting,diarrhea or constipation.   Denies dysuria, frequency, hesitancy or incontinence. Denies joint pain, swelling and limitation in mobility. Denies headaches, seizures, numbness, or tingling. Denies depression, anxiety or insomnia. Denies skin break down or rash.        Objective:   Physical Exam  Patient alert and oriented and in no cardiopulmonary distress.  HEENT: No facial asymmetry, EOMI, no sinus tenderness,  oropharynx pink and moist.  Neck supple no adenopathy.  Chest: Clear to auscultation bilaterally.  CVS: S1, S2 no murmurs, no S3.  ABD: Soft non tender. Bowel sounds normal.  Ext: No edema  MS: Adequate ROM spine, shoulders, hips and knees.  Skin: Intact, no ulcerations or rash noted.  Psych: Good eye contact, normal affect. Memory intact not anxious or depressed appearing.  CNS: CN 2-12 intact, power, tone and sensation normal throughout.       Assessment & Plan:

## 2012-06-12 NOTE — Assessment & Plan Note (Signed)
Controlled, no change in medication .Has large hiatal hernia, no surgical intervention

## 2012-06-12 NOTE — Assessment & Plan Note (Signed)
abnormaly high triglycerides when recently checked, rept lab ordered Pt advised to reduce cheese , butter fried and fatty foods, and ensure she has had nothing for 12 hours before lab drawn

## 2012-06-12 NOTE — Patient Instructions (Addendum)
Annual wellness in 4.5 month, please call if you need me before   Rept triglyceride and LDL fasting as soon as possible, Nothing but  WATER FOR 12 hours before  Continue to keep active, and eat healthily

## 2012-06-17 LAB — TRIGLYCERIDES: Triglycerides: 111 mg/dL (ref <150)

## 2012-10-06 ENCOUNTER — Other Ambulatory Visit: Payer: Self-pay | Admitting: Family Medicine

## 2012-10-22 ENCOUNTER — Telehealth: Payer: Self-pay

## 2012-10-22 NOTE — Telephone Encounter (Signed)
Memory loss x 6 month, advised daughter to come with her Mom in am

## 2012-10-23 ENCOUNTER — Ambulatory Visit (INDEPENDENT_AMBULATORY_CARE_PROVIDER_SITE_OTHER): Payer: Medicare Other | Admitting: Family Medicine

## 2012-10-23 ENCOUNTER — Encounter: Payer: Self-pay | Admitting: Family Medicine

## 2012-10-23 VITALS — BP 118/70 | HR 72 | Resp 16 | Ht 61.0 in | Wt 106.4 lb

## 2012-10-23 DIAGNOSIS — G3184 Mild cognitive impairment, so stated: Secondary | ICD-10-CM | POA: Insufficient documentation

## 2012-10-23 DIAGNOSIS — K219 Gastro-esophageal reflux disease without esophagitis: Secondary | ICD-10-CM

## 2012-10-23 DIAGNOSIS — E785 Hyperlipidemia, unspecified: Secondary | ICD-10-CM

## 2012-10-23 DIAGNOSIS — R5383 Other fatigue: Secondary | ICD-10-CM

## 2012-10-23 DIAGNOSIS — R5381 Other malaise: Secondary | ICD-10-CM

## 2012-10-23 MED ORDER — LOVASTATIN 40 MG PO TABS: 40.0000 mg | ORAL_TABLET | Freq: Every day | ORAL | Status: DC

## 2012-10-23 NOTE — Progress Notes (Signed)
  Subjective:    Patient ID: Kristy Jarvis, female    DOB: 06-18-1920, 79 y.o.   MRN: [id number]  HPI The PT is here for follow up and re-evaluation of chronic medical conditions, medication management and review of any available recent lab and radiology data.  Preventive health is updated, specifically  Immunization.   The PT denies any adverse reactions to current medications since the last visit.  Daughter accompanies her today at my request, after she had called ahead of the visit, with specific concerns about noted short term memory loss in the past 6 month.Pt agrees this is so, not overtly concerned at thsi time, and she performed well on MMSE Pt c/o recurrent epigastric pain and bloating, will reconsider 2nd opinion on her large hiatal hernia     Review of Systems See HPI Denies recent fever or chills. Denies sinus pressure, nasal congestion, ear pain or sore throat. Denies chest congestion, productive cough or wheezing. Denies chest pains, palpitations and leg swelling  Denies dysuria, frequency, hesitancy or incontinence. Denies joint pain, swelling and limitation in mobility. Denies headaches, seizures, numbness, or tingling. Denies depression, anxiety or insomnia. Denies skin break down or rash.        Objective:   Physical Exam Patient alert and oriented and in no cardiopulmonary distress.  HEENT: No facial asymmetry, EOMI, no sinus tenderness,  oropharynx pink and moist.  Neck supple no adenopathy.  Chest: Clear to auscultation bilaterally.  CVS: S1, S2 no murmurs, no S3.  ABD: Soft non tender. Bowel sounds normal.  Ext: No edema  MS: Adequate ROM spine, shoulders, hips and knees.  Skin: Intact, no ulcerations or rash noted.  Psych: Good eye contact, normal affect. Memory intact not anxious or depressed appearing.  CNS: CN 2-12 intact, power, tone and sensation normal throughout.        Assessment & Plan:

## 2012-10-23 NOTE — Patient Instructions (Addendum)
F/u in 4 month, call if you need me before  Fasting lipid, cmp as soon as possible, past due.  New for sleep OTC benadryl, 25 mg half tab at bedtime  Please put a note to yourself in the kitchen that you do not leave the stove on, and stick with this.  We will look again at starting medication for memory loss in the next 4  To 8 months, since you and your daughter are both aware that your short term memory is not what it was  Since you have a lot of stomach bloating with the hernia you may reconsider a 2nd opinion at [facility] for surgery, call if you want this please

## 2012-10-24 LAB — COMPREHENSIVE METABOLIC PANEL
ALT: 16 U/L (ref 0–35)
Alkaline Phosphatase: 93 U/L (ref 39–117)
Sodium: 143 mEq/L (ref 135–145)
Total Bilirubin: 0.7 mg/dL (ref 0.3–1.2)
Total Protein: 6.4 g/dL (ref 6.0–8.3)

## 2012-10-24 LAB — LIPID PANEL
HDL: 74 mg/dL (ref >39)
LDL Cholesterol: 72 mg/dL (ref 0–99)
Total CHOL/HDL Ratio: 2.2 Ratio

## 2012-10-26 ENCOUNTER — Other Ambulatory Visit: Payer: Self-pay | Admitting: Family Medicine

## 2012-10-27 NOTE — Assessment & Plan Note (Signed)
Daughter called ahead of the appt with concern re noted inc short term memory loss in the past approx 6 mont. She has come to the visit as requested. This concern is openly discussed. MMSE today is good. Family will reconsider need for med, pt is amenable to this

## 2012-10-27 NOTE — Assessment & Plan Note (Signed)
Hyperlipidemia:Low fat diet discussed and encouraged.  Controlled, no change in medication   

## 2012-10-27 NOTE — Assessment & Plan Note (Signed)
C/o recurrent nausea and bloating May need to re consider operation on hiatal hernia, family to think about this

## 2012-10-29 ENCOUNTER — Other Ambulatory Visit: Payer: Self-pay | Admitting: Family Medicine

## 2013-03-05 ENCOUNTER — Encounter: Payer: Self-pay | Admitting: Family Medicine

## 2013-03-05 ENCOUNTER — Encounter (INDEPENDENT_AMBULATORY_CARE_PROVIDER_SITE_OTHER): Payer: Self-pay

## 2013-03-05 ENCOUNTER — Ambulatory Visit (INDEPENDENT_AMBULATORY_CARE_PROVIDER_SITE_OTHER): Payer: Medicare Other | Admitting: Family Medicine

## 2013-03-05 VITALS — BP 124/72 | HR 74 | Resp 16 | Ht 61.0 in | Wt 105.0 lb

## 2013-03-05 DIAGNOSIS — E785 Hyperlipidemia, unspecified: Secondary | ICD-10-CM

## 2013-03-05 DIAGNOSIS — Z139 Encounter for screening, unspecified: Secondary | ICD-10-CM

## 2013-03-05 DIAGNOSIS — K219 Gastro-esophageal reflux disease without esophagitis: Secondary | ICD-10-CM

## 2013-03-05 DIAGNOSIS — Z1211 Encounter for screening for malignant neoplasm of colon: Secondary | ICD-10-CM

## 2013-03-05 DIAGNOSIS — Z9119 Patient's noncompliance with other medical treatment and regimen: Secondary | ICD-10-CM

## 2013-03-05 DIAGNOSIS — Z23 Encounter for immunization: Secondary | ICD-10-CM

## 2013-03-05 DIAGNOSIS — R5381 Other malaise: Secondary | ICD-10-CM

## 2013-03-05 DIAGNOSIS — G3184 Mild cognitive impairment, so stated: Secondary | ICD-10-CM

## 2013-03-05 LAB — POC HEMOCCULT BLD/STL (OFFICE/1-CARD/DIAGNOSTIC): Fecal Occult Blood, POC: NEGATIVE

## 2013-03-05 MED ORDER — LOVASTATIN 40 MG PO TABS: 40.0000 mg | ORAL_TABLET | Freq: Every day | ORAL | Status: DC

## 2013-03-05 NOTE — Patient Instructions (Addendum)
Annual wellness in 4.5 month, call if you need me  Rectal today and flu vaccine  Labs in June were excellent, continue current medication  Fasting CBC, TSH, Vit D, lipid cmp in 4.5 month, before visit

## 2013-03-09 DIAGNOSIS — K219 Gastro-esophageal reflux disease without esophagitis: Secondary | ICD-10-CM | POA: Insufficient documentation

## 2013-03-09 DIAGNOSIS — Z91199 Patient's noncompliance with other medical treatment and regimen due to unspecified reason: Secondary | ICD-10-CM | POA: Insufficient documentation

## 2013-03-09 DIAGNOSIS — Z9119 Patient's noncompliance with other medical treatment and regimen: Secondary | ICD-10-CM | POA: Insufficient documentation

## 2013-03-09 NOTE — Assessment & Plan Note (Signed)
zostavax still outstanding, finanmcial barrier

## 2013-03-09 NOTE — Assessment & Plan Note (Signed)
Controlled, no change in medication Hyperlipidemia:Low fat diet discussed and encouraged.  \ 

## 2013-03-09 NOTE — Assessment & Plan Note (Signed)
Controlled, no change in medication  

## 2013-03-09 NOTE — Progress Notes (Signed)
  Subjective:    Patient ID: Kristy Jarvis, female    DOB: 07-08-1920, 79 y.o.   MRN: [id number]  HPI The PT is here for follow up and re-evaluation of chronic medical conditions, medication management and review of any available recent lab and radiology data.  Preventive health is updated, specifically  Cancer screening and Immunization.   Questions or concerns regarding consultations or procedures which the PT has had in the interim are  addressed. The PT denies any adverse reactions to current medications since the last visit.  There are no new concerns.  There are no specific complaints       Review of Systems See HPI Denies recent fever or chills. Denies sinus pressure, nasal congestion, ear pain or sore throat. Denies chest congestion, productive cough or wheezing. Denies chest pains, palpitations and leg swelling Denies abdominal pain, nausea, vomiting,diarrhea or constipation.   Denies dysuria, frequency, hesitancy or incontinence. Denies joint pain, swelling and limitation in mobility. Denies headaches, seizures, numbness, or tingling. Denies depression, anxiety or insomnia. Denies skin break down or rash.        Objective:   Physical Exam Patient alert and oriented and in no cardiopulmonary distress.  HEENT: No facial asymmetry, EOMI, no sinus tenderness,  oropharynx pink and moist.  Neck supple no adenopathy.  Chest: Clear to auscultation bilaterally.  CVS: S1, S2 no murmurs, no S3.  ABD: Soft non tender. Bowel sounds normal. Rectal: no mass, heme negative stool Ext: No edema  MS: Adequate ROM spine, shoulders, hips and knees.  Skin: Intact, no ulcerations or rash noted.  Psych: Good eye contact, normal affect. Memory intact not anxious or depressed appearing.  CNS: CN 2-12 intact, power, tone and sensation normal throughout.        Assessment & Plan:

## 2013-03-09 NOTE — Assessment & Plan Note (Signed)
Unchanged, no need for medication at this time Pt encouraged to remain involved in daily activities as she is

## 2013-03-25 ENCOUNTER — Encounter (HOSPITAL_COMMUNITY): Payer: Self-pay | Admitting: Emergency Medicine

## 2013-03-25 ENCOUNTER — Emergency Department (HOSPITAL_COMMUNITY): Payer: Medicare Other

## 2013-03-25 ENCOUNTER — Inpatient Hospital Stay (HOSPITAL_COMMUNITY)
Admission: EM | Admit: 2013-03-25 | Discharge: 2013-03-26 | DRG: 694 | Disposition: A | Discharge status: Home / Self Care | Payer: Medicare Other | Attending: Internal Medicine | Admitting: Internal Medicine

## 2013-03-25 DIAGNOSIS — R739 Hyperglycemia, unspecified: Secondary | ICD-10-CM | POA: Diagnosis present

## 2013-03-25 DIAGNOSIS — Z9089 Acquired absence of other organs: Secondary | ICD-10-CM

## 2013-03-25 DIAGNOSIS — E785 Hyperlipidemia, unspecified: Secondary | ICD-10-CM | POA: Diagnosis present

## 2013-03-25 DIAGNOSIS — Z8249 Family history of ischemic heart disease and other diseases of the circulatory system: Secondary | ICD-10-CM

## 2013-03-25 DIAGNOSIS — N200 Calculus of kidney: Secondary | ICD-10-CM

## 2013-03-25 DIAGNOSIS — K219 Gastro-esophageal reflux disease without esophagitis: Secondary | ICD-10-CM | POA: Diagnosis present

## 2013-03-25 DIAGNOSIS — M810 Age-related osteoporosis without current pathological fracture: Secondary | ICD-10-CM | POA: Diagnosis present

## 2013-03-25 DIAGNOSIS — G3184 Mild cognitive impairment, so stated: Secondary | ICD-10-CM | POA: Diagnosis present

## 2013-03-25 DIAGNOSIS — Z823 Family history of stroke: Secondary | ICD-10-CM

## 2013-03-25 DIAGNOSIS — R7309 Other abnormal glucose: Secondary | ICD-10-CM | POA: Diagnosis present

## 2013-03-25 DIAGNOSIS — N135 Crossing vessel and stricture of ureter without hydronephrosis: Principal | ICD-10-CM

## 2013-03-25 DIAGNOSIS — Z800 Family history of malignant neoplasm of digestive organs: Secondary | ICD-10-CM

## 2013-03-25 DIAGNOSIS — Z833 Family history of diabetes mellitus: Secondary | ICD-10-CM

## 2013-03-25 DIAGNOSIS — R390 Extravasation of urine: Secondary | ICD-10-CM

## 2013-03-25 DIAGNOSIS — N1330 Unspecified hydronephrosis: Secondary | ICD-10-CM | POA: Diagnosis present

## 2013-03-25 DIAGNOSIS — R112 Nausea with vomiting, unspecified: Secondary | ICD-10-CM | POA: Diagnosis present

## 2013-03-25 DIAGNOSIS — R109 Unspecified abdominal pain: Secondary | ICD-10-CM | POA: Diagnosis present

## 2013-03-25 LAB — CBC WITH DIFFERENTIAL/PLATELET
Eosinophils Absolute: 0 10*3/uL (ref 0.0–0.7)
Hemoglobin: 14 g/dL (ref 12.0–15.0)
Lymphocytes Relative: 14 % (ref 12–46)
Lymphs Abs: 1.1 10*3/uL (ref 0.7–4.0)
MCH: 30.3 pg (ref 26.0–34.0)
MCV: 91.3 fL (ref 78.0–100.0)
Neutrophils Relative %: 80 % — ABNORMAL HIGH (ref 43–77)
Platelets: 246 10*3/uL (ref 150–400)
RBC: 4.62 MIL/uL (ref 3.87–5.11)
RDW: 13.5 % (ref 11.5–15.5)
WBC: 7.6 10*3/uL (ref 4.0–10.5)

## 2013-03-25 LAB — BASIC METABOLIC PANEL
CO2: 26 mEq/L (ref 19–32)
GFR calc Af Amer: 54 mL/min — ABNORMAL LOW (ref >90)
GFR calc non Af Amer: 46 mL/min — ABNORMAL LOW (ref >90)
Glucose, Bld: 154 mg/dL — ABNORMAL HIGH (ref 70–99)
Potassium: 3.6 mEq/L (ref 3.5–5.1)
Sodium: 140 mEq/L (ref 135–145)

## 2013-03-25 LAB — URINALYSIS, ROUTINE W REFLEX MICROSCOPIC
Nitrite: NEGATIVE
Specific Gravity, Urine: 1.02 (ref 1.005–1.030)
Urobilinogen, UA: 0.2 mg/dL (ref 0.0–1.0)
pH: 8 (ref 5.0–8.0)

## 2013-03-25 LAB — URINE MICROSCOPIC-ADD ON

## 2013-03-25 MED ORDER — PANTOPRAZOLE SODIUM 40 MG IV SOLR
40.0000 mg | INTRAVENOUS | Status: DC
  Administered 2013-03-25 – 2013-03-26 (×2): 40 mg via INTRAVENOUS
  Filled 2013-03-25 (×2): qty 40

## 2013-03-25 MED ORDER — SODIUM CHLORIDE 0.9 % IJ SOLN
3.0000 mL | Freq: Two times a day (BID) | INTRAMUSCULAR | Status: DC
  Administered 2013-03-25: 3 mL via INTRAVENOUS

## 2013-03-25 MED ORDER — HYDROMORPHONE HCL PF 1 MG/ML IJ SOLN: 0.5000 mg | INTRAMUSCULAR | Status: DC | PRN

## 2013-03-25 MED ORDER — ONDANSETRON HCL 4 MG PO TABS: 4.0000 mg | ORAL_TABLET | Freq: Four times a day (QID) | ORAL | Status: DC | PRN

## 2013-03-25 MED ORDER — HYDROMORPHONE HCL PF 1 MG/ML IJ SOLN
0.5000 mg | Freq: Once | INTRAMUSCULAR | Status: AC
  Administered 2013-03-25: 0.5 mg via INTRAVENOUS
  Filled 2013-03-25 (×2): qty 1

## 2013-03-25 MED ORDER — FENTANYL CITRATE 0.05 MG/ML IJ SOLN
50.0000 ug | Freq: Once | INTRAMUSCULAR | Status: AC
  Administered 2013-03-25: 50 ug via INTRAVENOUS
  Filled 2013-03-25: qty 2

## 2013-03-25 MED ORDER — ONDANSETRON HCL 4 MG/2ML IJ SOLN
INTRAMUSCULAR | Status: AC
  Filled 2013-03-25: qty 2

## 2013-03-25 MED ORDER — ACETAMINOPHEN 650 MG RE SUPP: 650.0000 mg | Freq: Four times a day (QID) | RECTAL | Status: DC | PRN

## 2013-03-25 MED ORDER — ALUM & MAG HYDROXIDE-SIMETH 200-200-20 MG/5ML PO SUSP: 30.0000 mL | Freq: Four times a day (QID) | ORAL | Status: DC | PRN

## 2013-03-25 MED ORDER — ENOXAPARIN SODIUM 30 MG/0.3ML ~~LOC~~ SOLN
30.0000 mg | SUBCUTANEOUS | Status: DC
  Administered 2013-03-25 – 2013-03-26 (×2): 30 mg via SUBCUTANEOUS
  Filled 2013-03-25 (×2): qty 0.3

## 2013-03-25 MED ORDER — ACETAMINOPHEN 325 MG PO TABS: 650.0000 mg | ORAL_TABLET | Freq: Four times a day (QID) | ORAL | Status: DC | PRN

## 2013-03-25 MED ORDER — SODIUM CHLORIDE 0.9 % IV SOLN
Freq: Once | INTRAVENOUS | Status: AC
  Administered 2013-03-25: 20 mL/h via INTRAVENOUS

## 2013-03-25 MED ORDER — HYDROCODONE-ACETAMINOPHEN 5-325 MG PO TABS: 1.0000 | ORAL_TABLET | ORAL | Status: DC | PRN

## 2013-03-25 MED ORDER — IOHEXOL 300 MG/ML  SOLN
100.0000 mL | Freq: Once | INTRAMUSCULAR | Status: AC | PRN
  Administered 2013-03-25: 100 mL via INTRAVENOUS

## 2013-03-25 MED ORDER — IOHEXOL 300 MG/ML  SOLN
50.0000 mL | Freq: Once | INTRAMUSCULAR | Status: AC | PRN
  Administered 2013-03-25: 50 mL via ORAL

## 2013-03-25 MED ORDER — FLEET ENEMA 7-19 GM/118ML RE ENEM: 1.0000 | ENEMA | Freq: Once | RECTAL | Status: AC | PRN

## 2013-03-25 MED ORDER — ONDANSETRON HCL 4 MG/2ML IJ SOLN
4.0000 mg | Freq: Three times a day (TID) | INTRAMUSCULAR | Status: DC | PRN
  Filled 2013-03-25: qty 2

## 2013-03-25 MED ORDER — ONDANSETRON HCL 4 MG/2ML IJ SOLN
4.0000 mg | Freq: Once | INTRAMUSCULAR | Status: AC
  Administered 2013-03-25: 4 mg via INTRAVENOUS

## 2013-03-25 MED ORDER — ONDANSETRON HCL 4 MG/2ML IJ SOLN
4.0000 mg | Freq: Four times a day (QID) | INTRAMUSCULAR | Status: DC | PRN
  Administered 2013-03-25: 4 mg via INTRAVENOUS

## 2013-03-25 MED ORDER — TRAZODONE HCL 50 MG PO TABS: 25.0000 mg | ORAL_TABLET | Freq: Every evening | ORAL | Status: DC | PRN

## 2013-03-25 MED ORDER — SODIUM CHLORIDE 0.9 % IV SOLN
INTRAVENOUS | Status: DC
  Administered 2013-03-25 – 2013-03-26 (×2): via INTRAVENOUS

## 2013-03-25 MED ORDER — BISACODYL 10 MG RE SUPP: 10.0000 mg | Freq: Every day | RECTAL | Status: DC | PRN

## 2013-03-25 NOTE — Consult Note (Signed)
Note [id number]

## 2013-03-25 NOTE — ED Notes (Signed)
Up to bedside commode to void.  Vomited approximately 250 ml yellow emesis

## 2013-03-25 NOTE — ED Provider Notes (Signed)
CSN: [id number]     Arrival date & time 03/25/13  0107 History   First MD Initiated Contact with Patient 03/25/13 0116     Chief Complaint  Patient presents with  . Flank Pain  . Nausea   (Consider location/radiation/quality/duration/timing/severity/associated sxs/prior Treatment) HPI Comments: 79 year old female with a history of renal colic, diverticulosis of the colon and acid reflux who also has a history of cholecystectomy and abdominal hysterectomy. She presents with a complaint of left-sided abdominal tenderness which occurred acutely at 9:00 this evening. There were no prodromal symptoms, the symptoms have been fairly persistent though they did ease off with Tylenol. She has had nausea and vomiting prior to arrival. Nothing seems to make this better with regards to position. She has no hematuria, dysuria or changes in her bowel habits. Currently the symptoms are moderate to severe. She denies any history of diverticulitis.  Patient is a 79 y.o. female presenting with flank pain. The history is provided by the patient and a relative.  Flank Pain    Past Medical History  Diagnosis Date  . Intermittent vertigo   . Renal colic   . Osteoporosis   . Hyperlipidemia   . GERD (gastroesophageal reflux disease)   . Diverticulosis of colon     by CT  . PONV (postoperative nausea and vomiting)   . Cataract 2013    right, no surgery at this time   Past Surgical History  Procedure Laterality Date  . Cataract extraction  2002    left eye   . Cholecystectomy  1989  . Abdominal hysterectomy    . Esophagogastroduodenoscopy  Jan  2007    prominent schatzki's ring, s/p 51F, small to moderate size hh  . Bunionectomy  06/27/2011    left , Dr Berline Lopes  . Eye surgery  2005 approx    cataract left   Family History  Problem Relation Age of Onset  . Heart attack Father   . Diabetes Sister   . Liver cancer      family history   . Colon cancer Neg Hx   . Anesthesia problems Neg Hx   .  Hypotension Neg Hx   . Malignant hyperthermia Neg Hx   . Pseudochol deficiency Neg Hx    History  Substance Use Topics  . Smoking status: Never Smoker   . Smokeless tobacco: Not on file  . Alcohol Use: No   OB History   Grav Para Term Preterm Abortions TAB SAB Ect Mult Living                 Review of Systems  Genitourinary: Positive for flank pain.  All other systems reviewed and are negative.    Allergies  Alendronate sodium and Penicillins  Home Medications   Current Outpatient Rx  Name  Route  Sig  Dispense  Refill  . acetaminophen (TYLENOL) 500 MG tablet   Oral   Take 500 mg by mouth every 6 (six) hours as needed. Pain         . aspirin (ASPIRIN LOW DOSE) 81 MG EC tablet   Oral   Take 81 mg by mouth daily.           . Calcium Lactate 750 MG TABS   Oral   Take 1 tablet by mouth 2 (two) times daily.         . Cholecalciferol (VITAMIN D) 400 UNITS capsule   Oral   Take 800 Units by mouth daily.          Marland Kitchen  lovastatin (MEVACOR) 40 MG tablet   Oral   Take 1 tablet (40 mg total) by mouth at bedtime.   30 tablet   3   . omeprazole (PRILOSEC) 20 MG capsule      TAKE 1 CAPSULE (20 MG TOTAL) BY MOUTH DAILY.   30 capsule   6    BP 140/107  Pulse 64  Temp(Src) 97.8 F (36.6 C) (Oral)  Resp 18  Ht 5' (1.524 m)  Wt 105 lb (47.628 kg)  BMI 20.51 kg/m2  SpO2 97% Physical Exam  Nursing note and vitals reviewed. Constitutional: She appears well-developed and well-nourished.  Uncomfortable appearing  HENT:  Head: Normocephalic and atraumatic.  Mouth/Throat: Oropharynx is clear and moist. No oropharyngeal exudate.  Eyes: Conjunctivae and EOM are normal. Pupils are equal, round, and reactive to light. Right eye exhibits no discharge. Left eye exhibits no discharge. No scleral icterus.  Neck: Normal range of motion. Neck supple. No JVD present. No thyromegaly present.  Cardiovascular: Normal rate, regular rhythm, normal heart sounds and intact distal  pulses.  Exam reveals no gallop and no friction rub.   No murmur heard. Pulmonary/Chest: Effort normal and breath sounds normal. No respiratory distress. She has no wheezes. She has no rales.  Abdominal: Soft. Bowel sounds are normal. She exhibits no distension and no mass. There is tenderness. There is no rebound and no guarding.  Left CVA tenderness is present, left lower quadrant tenderness with mild guarding, no rebound, no cough pain, no peritoneal signs  Musculoskeletal: Normal range of motion. She exhibits no edema and no tenderness.  Lymphadenopathy:    She has no cervical adenopathy.  Neurological: She is alert. Coordination normal.  Skin: Skin is warm and dry. No rash noted. No erythema.  Psychiatric: She has a normal mood and affect. Her behavior is normal.    ED Course  Procedures (including critical care time) Labs Review Labs Reviewed  URINALYSIS, ROUTINE W REFLEX MICROSCOPIC - Abnormal; Notable for the following:    Color, Urine STRAW (*)    APPearance HAZY (*)    Glucose, UA 100 (*)    Hgb urine dipstick TRACE (*)    Ketones, ur 15 (*)    All other components within normal limits  CBC WITH DIFFERENTIAL - Abnormal; Notable for the following:    Neutrophils Relative % 80 (*)    All other components within normal limits  BASIC METABOLIC PANEL - Abnormal; Notable for the following:    Glucose, Bld 154 (*)    GFR calc non Af Amer 46 (*)    GFR calc Af Amer 54 (*)    All other components within normal limits  URINE MICROSCOPIC-ADD ON - Abnormal; Notable for the following:    Squamous Epithelial / LPF FEW (*)    Bacteria, UA MANY (*)    All other components within normal limits   Imaging Review Ct Abdomen Pelvis W Contrast  03/25/2013   CLINICAL DATA:  Left lower quadrant abdominal pain. Nausea and vomiting  EXAM: CT ABDOMEN AND PELVIS WITH CONTRAST  TECHNIQUE: Multidetector CT imaging of the abdomen and pelvis was performed using the standard protocol following bolus  administration of intravenous contrast.  CONTRAST:  70mL OMNIPAQUE IOHEXOL 300 MG/ML SOLN, 178mL OMNIPAQUE IOHEXOL 300 MG/ML SOLN  COMPARISON:  CT of the abdomen and pelvis 09/16/2010.  FINDINGS: Lung Bases: Large hiatal hernia  Abdomen/Pelvis: Image 33 series 2 demonstrates a 2 mm calculus in the proximal 3rd of the left ureter or immediately  beyond the left ureterovesicular junction. This is associated with significant obstruction as evidenced by mild left hydroureteronephrosis and extensive perinephric fluid and stranding, which has appearance most suggestive of recent forniceal rupture. This fluid tracks inferiorly through the retroperitoneum along the anterior surface of the left psoas muscle extending across nearly to the midline. No additional calculi are noted within the left renal collecting system or elsewhere in the left ureter. 4 mm nonobstructive calculus in the lower pole collecting system of the right kidney noted. No right ureteral stones are identified at this time. No calculi within the urinary bladder. Postcontrast delayed images demonstrate some anterior extravasation urine from the left renal collecting system.  Status post cholecystectomy. The appearance of the liver, pancreas, spleen and bilateral adrenal glands is unremarkable. Extensive atherosclerosis throughout the abdominal and pelvic vasculature, without evidence of aneurysm or dissection No significant volume of ascites. No pneumoperitoneum. No pathologic distention of small bowel. Numerous colonic diverticulae are noted, particularly in the region of the sigmoid colon, without surrounding inflammatory changes to suggest an acute diverticulitis at this time. No definite lymphadenopathy identified within the abdomen or pelvis.  Musculoskeletal: There are no aggressive appearing lytic or blastic lesions noted in the visualized portions of the skeleton.  IMPRESSION: 1. Obstructive 2 mm stone in the proximal 3rd of the left ureter at or  immediately beyond the left ureteropelvic junction. Although there is only mild proximal hydronephrosis at this time, there is extensive perinephric fluid and stranding, and there is evidence of forniceal rupture with extravasation of urine on delayed images extending anteriorly, as above 2. Nonobstructive 4 mm calculus in the lower pole collecting system of the right kidney incidentally noted. 3. Status post cholecystectomy. 4. Colonic diverticulosis without evidence to suggest acute diverticulitis at this time. 5. Large hiatal hernia 6. Extensive atherosclerosis.   Electronically Signed   By: Vinnie Langton M.D.   On: 03/25/2013 05:55    EKG Interpretation   None       MDM   1. Kidney stone on left side   2. Extravasation of urine from left ureter    The patient has possible kidney stone though with history of diverticulosis and reproducible pain on also consider diverticulitis with possible perforation. Labs, urinalysis, CT scan pending labs. Medications have been ordered, patient will be placed on a cardiac monitor and she is receiving Dilaudid.  I have personally viewed the CT scan and agree with the radiologist interpretation that there does appear to be significant extravasation from the left renal collection system, possible rupture of the ureter, the patient's symptoms seem to be better controlled with intravenous medications, she appears hemodynamically stable but has ongoing nausea and pain. Because she has kidney stone Center obstructing the ureter she will need urologic consultation in the hospital.  Care was discussed with Dr. Michela Pitcher who agrees to see the patient in consultation, agrees that the patient should be admitted to the hospital. Hospitalist consultation requested  D/w Dr. Sarajane Jews who will admit to the hospital.  Meds given in ED:  Medications  HYDROmorphone (DILAUDID) injection 0.5 mg (0.5 mg Intravenous Given 03/25/13 0230)  ondansetron (ZOFRAN) injection 4 mg (4  mg Intravenous Given 03/25/13 0226)  ondansetron (ZOFRAN) 4 MG/2ML injection (  Duplicate [id number])  0.9 %  sodium chloride infusion (20 mL/hr Intravenous New Bag/Given 03/25/13 0236)  iohexol (OMNIPAQUE) 300 MG/ML solution 50 mL (50 mLs Oral Contrast Given 03/25/13 0347)  fentaNYL (SUBLIMAZE) injection 50 mcg (50 mcg Intravenous Given 03/25/13 0429)  iohexol (OMNIPAQUE) 300 MG/ML solution 100 mL (100 mLs Intravenous Contrast Given 03/25/13 0525)      Johnna Acosta, MD 03/25/13 [id number]

## 2013-03-25 NOTE — ED Notes (Signed)
Still taking small sips of oral contrast - has 1/2 of 1st bottle in at this time

## 2013-03-25 NOTE — ED Notes (Signed)
CT Tech reports patient vomited after procedure completed.  No nausea at present.

## 2013-03-25 NOTE — Progress Notes (Signed)
Kidney stones

## 2013-03-25 NOTE — H&P (Signed)
Patient seen, independently examined and chart reviewed. I agree with exam, assessment and plan discussed with Dyanne Carrel, NP.  There is an 79 year old woman who was feeling well until yesterday evening when she developed sudden left-sided abdominal pain. In the emergency department CT scan revealed obstructive 2 mm stone with mild proximal hydronephrosis and evidence of forniceal rupture with extravasation of urine. EDP discussed the case with Dr. Michela Pitcher, who recommended medical admission with urology consultation at [facility].  Currently the patient feels fairly well, her pain is being well controlled. She has had some nausea and vomiting today. She remains afebrile with stable vital signs. She appears calm and comfortable. Cardiovascular regular rate and rhythm. Respiratory clear to auscultation bilaterally. No wheezes, rales or rhonchi. Normal respiratory effort. She is well-appearing. Abdomen soft, some left lower abdominal pain.   Basic metabolic panel and CBC unremarkable. Urinalysis was equivocal.  We will continue antiemetics and pain control for ureteral obstruction. Management of stone and extravasation of urine deferred to urology.   Murray Hodgkins, MD [facility] [phone]

## 2013-03-25 NOTE — Progress Notes (Signed)
Pain from kidney stones

## 2013-03-25 NOTE — ED Notes (Signed)
MD at bedside. Hazle Nordmann

## 2013-03-25 NOTE — ED Notes (Signed)
Pt c/o left flank pain that started since last night with nausea.

## 2013-03-25 NOTE — ED Notes (Signed)
Ambulated to bathroom with assistance.  Urine specimen obtained.  Returned to bed and became nauseated again

## 2013-03-25 NOTE — ED Notes (Signed)
Drinking contrast at present

## 2013-03-25 NOTE — H&P (Signed)
[facility] History and Physical  JACIE VANWERT [id number] DOB: 1921/03/18 DOA: 03/25/2013  Referring physician:  Sharol Harness: Tula Nakayama, MD  Specialists:   Chief Complaint: Pain, nausea,  HPI: Kristy Jarvis is a very pleasant 79 y.o. female  with a past medical history that includes renal colic, hyperlipidemia, GERD, diverticulosis of colon, vital hernia presents emergency department with chief complaint of abdominal pain with nausea and vomiting. Information is obtained from the patient and her daughter who is at the bedside. Patient indicates she was in her usual state of health until yesterday evening around 9 PM when she developed sudden sharp right-sided abdominal pain. She states that the pain was constant. Shortly thereafter she developed nausea and had 2 episodes of emesis before calling her daughter. She states that she paced the floor for relief but this was not helpful.she reports that her emesis was mostly clear she denies any coffee ground emesis or blood tinged emesis.she denies any dysuria hematuria frequency or urgency. She denies constipation diarrhea melena hematochezia. She denies fever chills shortness of breath .she reports she went to Dr. Moshe Cipro her PCP just last month and "everything was okay" .urgency room she had a serum glucose of 154 otherwise lab work was unremarkable. CT of the abdomen yields a left ureteropelvic junction obstruction with mild hydronephrosis and forniceal rupture with extravasation of urine. She was hemodynamically stable. Urology was consulted and hospitalists asked to admit.    Review of Systems: 10 point review of systems completed and all systems are negative except as indicated in the history of present illness   Past Medical History  Diagnosis Date  . Intermittent vertigo   . Renal colic   . Osteoporosis   . Hyperlipidemia   . GERD (gastroesophageal reflux disease)   . Diverticulosis of colon     by CT  . PONV  (postoperative nausea and vomiting)   . Cataract 2013    right, no surgery at this time   Past Surgical History  Procedure Laterality Date  . Cataract extraction  2002    left eye   . Cholecystectomy  1989  . Abdominal hysterectomy    . Esophagogastroduodenoscopy  Jan  2007    prominent schatzki's ring, s/p 58F, small to moderate size hh  . Bunionectomy  06/27/2011    left , Dr Berline Lopes  . Eye surgery  2005 approx    cataract left   Social History:  reports that she has never smoked. She does not have any smokeless tobacco history on file. She reports that she does not drink alcohol or use illicit drugs. Patient lives alone she is independent with ADLs she still drives and volunteers with [employer]. She recently started exhibiting some short-term memory issues other than that is independent  Allergies  Allergen Reactions  . Alendronate Sodium Other (See Comments)    Chest Pain, Fatigue   . Penicillins Rash    Family History  Problem Relation Age of Onset  . Heart attack Father   . Diabetes Sister   . Liver cancer      family history   . Colon cancer Neg Hx   . Anesthesia problems Neg Hx   . Hypotension Neg Hx   . Malignant hyperthermia Neg Hx   . Pseudochol deficiency Neg Hx    mother deceased at age 5 from "old age father deceased at 52 from heart attack. She had 9 siblings 3 currently living their collective medical history is positive for hypertension, CAD,  stroke, a variety of cancers which he care of remember the specifics  Prior to Admission medications   Medication Sig Start Date End Date Taking? Authorizing Provider  aspirin (ASPIRIN LOW DOSE) 81 MG EC tablet Take 81 mg by mouth daily.     Yes Historical Provider, MD  Calcium Lactate 750 MG TABS Take 1 tablet by mouth 2 (two) times daily.   Yes Historical Provider, MD  Cholecalciferol (VITAMIN D) 400 UNITS capsule Take 800 Units by mouth daily.    Yes Historical Provider, MD  lovastatin (MEVACOR) 40 MG tablet  Take 1 tablet (40 mg total) by mouth at bedtime. 03/05/13  Yes Fayrene Helper, MD  omeprazole (PRILOSEC) 20 MG capsule TAKE 1 CAPSULE (20 MG TOTAL) BY MOUTH DAILY. 10/06/12  Yes Fayrene Helper, MD  acetaminophen (TYLENOL) 500 MG tablet Take 500 mg by mouth every 6 (six) hours as needed. Pain    Historical Provider, MD   Physical Exam: Filed Vitals:   03/25/13 0832  BP: 148/68  Pulse: 55  Temp: 97.9 F (36.6 C)  Resp:      General:  well-nourished appears comfortable  Eyes: PE RRL, EOMI, no scleral icterus  ENT: ears clear nose without drainage oropharynx without erythema or exudate. Mucous membranes of her mouth are moist and pink. Very poor dentition  Neck: supple no JVD full range of motion no lymphadenopathy  Cardiovascular: slightly distant your. No murmur no gallop no rub. No lower extremity edema  Respiratory: normal effort breath sounds are clear bilaterally no rhonchi no wheeze  Abdomen: round soft slightly distended bowel sounds very sluggish mild tenderness to palpation in right upper and lower quadrant.r  Skin: Slightly pale but warm and dry. No rashes or lesions  Musculoskeletal: fair muscle tone. No clubbing no cyanosis ointments without swelling/erythema nontender to  Psychiatric: calm cooperative  Neurologic: cranial nerves II through XII grossly intact speech is clear facial symmetry  Labs on Admission:  Basic Metabolic Panel:  Recent Labs Lab 03/25/13 0149  NA 140  K 3.6  CL 99  CO2 26  GLUCOSE 154*  BUN 18  CREATININE 1.04  CALCIUM 10.5   Liver Function Tests: No results found for this basename: AST, ALT, ALKPHOS, BILITOT, PROT, ALBUMIN,  in the last 168 hours No results found for this basename: LIPASE, AMYLASE,  in the last 168 hours No results found for this basename: AMMONIA,  in the last 168 hours CBC:  Recent Labs Lab 03/25/13 0149  WBC 7.6  NEUTROABS 6.1  HGB 14.0  HCT 42.2  MCV 91.3  PLT 246   Cardiac Enzymes: No  results found for this basename: CKTOTAL, CKMB, CKMBINDEX, TROPONINI,  in the last 168 hours  BNP (last 3 results) No results found for this basename: PROBNP,  in the last 8760 hours CBG: No results found for this basename: GLUCAP,  in the last 168 hours  Radiological Exams on Admission: Ct Abdomen Pelvis W Contrast  03/25/2013   CLINICAL DATA:  Left lower quadrant abdominal pain. Nausea and vomiting  EXAM: CT ABDOMEN AND PELVIS WITH CONTRAST  TECHNIQUE: Multidetector CT imaging of the abdomen and pelvis was performed using the standard protocol following bolus administration of intravenous contrast.  CONTRAST:  59mL OMNIPAQUE IOHEXOL 300 MG/ML SOLN, 135mL OMNIPAQUE IOHEXOL 300 MG/ML SOLN  COMPARISON:  CT of the abdomen and pelvis 09/16/2010.  FINDINGS: Lung Bases: Large hiatal hernia  Abdomen/Pelvis: Image 33 series 2 demonstrates a 2 mm calculus in the proximal 3rd of  the left ureter or immediately beyond the left ureterovesicular junction. This is associated with significant obstruction as evidenced by mild left hydroureteronephrosis and extensive perinephric fluid and stranding, which has appearance most suggestive of recent forniceal rupture. This fluid tracks inferiorly through the retroperitoneum along the anterior surface of the left psoas muscle extending across nearly to the midline. No additional calculi are noted within the left renal collecting system or elsewhere in the left ureter. 4 mm nonobstructive calculus in the lower pole collecting system of the right kidney noted. No right ureteral stones are identified at this time. No calculi within the urinary bladder. Postcontrast delayed images demonstrate some anterior extravasation urine from the left renal collecting system.  Status post cholecystectomy. The appearance of the liver, pancreas, spleen and bilateral adrenal glands is unremarkable. Extensive atherosclerosis throughout the abdominal and pelvic vasculature, without evidence of  aneurysm or dissection No significant volume of ascites. No pneumoperitoneum. No pathologic distention of small bowel. Numerous colonic diverticulae are noted, particularly in the region of the sigmoid colon, without surrounding inflammatory changes to suggest an acute diverticulitis at this time. No definite lymphadenopathy identified within the abdomen or pelvis.  Musculoskeletal: There are no aggressive appearing lytic or blastic lesions noted in the visualized portions of the skeleton.  IMPRESSION: 1. Obstructive 2 mm stone in the proximal 3rd of the left ureter at or immediately beyond the left ureteropelvic junction. Although there is only mild proximal hydronephrosis at this time, there is extensive perinephric fluid and stranding, and there is evidence of forniceal rupture with extravasation of urine on delayed images extending anteriorly, as above 2. Nonobstructive 4 mm calculus in the lower pole collecting system of the right kidney incidentally noted. 3. Status post cholecystectomy. 4. Colonic diverticulosis without evidence to suggest acute diverticulitis at this time. 5. Large hiatal hernia 6. Extensive atherosclerosis.   Electronically Signed   By: Vinnie Langton M.D.   On: 03/25/2013 05:55    EKG:   Assessment/Plan Principal Problem:   Ureter obstruction: With forniceal rupture with extravasation of urine. Admit to telemetry. Urology consult requested. Monitor intake and output. Vital signs every 4x3. Active Problems: Hydronephrosis: left per CT mild proximal hydronephrosis at this time, there is  extensive perinephric fluid and stranding secondary to #1. Currently patient is afebrile and white count is within the limits of normal. Provide pain medicine and anti-emetic as needed.urology consult.  Nausea & vomiting: Related to above. Somewhat improved since Zofran given in the emergency department. Will continue this. No further vomiting. Will provide clear liquid diet and advance as  tolerated.  Abdominal pain: due to above. Provide pain medicine as needed. Currently pain managed.   Hyperglycemia: no hx diabetes. Will check A1c. Monitor. Will use SSI as indicated    Renal calculi: hx of same. See #1 also non-obstructive on right.     HYPERLIPIDEMIA: chart review indicates PCP visit last month. Continue statin when #3 resolved    GERD: stable at baseline. Continue PPI IV    MCI (mild cognitive impairment): appear stable at baseline    Dr Ranae Plumber per ED MD   Code Status: full Family Communication: daughter at bedside Disposition Plan: home when ready   Time spent: 22 minutes  [facility] Pager [phone]  If 7PM-7AM, please contact night-coverage [email or web address] Password [email or web address] 03/25/2013, 9:21 AM

## 2013-03-26 DIAGNOSIS — N135 Crossing vessel and stricture of ureter without hydronephrosis: Principal | ICD-10-CM

## 2013-03-26 LAB — COMPREHENSIVE METABOLIC PANEL
ALT: 14 U/L (ref 0–35)
Alkaline Phosphatase: 70 U/L (ref 39–117)
CO2: 27 mEq/L (ref 19–32)
Calcium: 8.8 mg/dL (ref 8.4–10.5)
Creatinine, Ser: 1.02 mg/dL (ref 0.50–1.10)
GFR calc Af Amer: 55 mL/min — ABNORMAL LOW (ref >90)
GFR calc non Af Amer: 47 mL/min — ABNORMAL LOW (ref >90)
Glucose, Bld: 92 mg/dL (ref 70–99)
Potassium: 3.6 mEq/L (ref 3.5–5.1)
Sodium: 144 mEq/L (ref 135–145)
Total Protein: 5.5 g/dL — ABNORMAL LOW (ref 6.0–8.3)

## 2013-03-26 LAB — CBC
HCT: 38.2 % (ref 36.0–46.0)
Hemoglobin: 12.3 g/dL (ref 12.0–15.0)
MCHC: 32.2 g/dL (ref 30.0–36.0)
RBC: 4.1 MIL/uL (ref 3.87–5.11)
WBC: 5.7 10*3/uL (ref 4.0–10.5)

## 2013-03-26 LAB — GLUCOSE, CAPILLARY: Glucose-Capillary: 86 mg/dL (ref 70–99)

## 2013-03-26 MED ORDER — ONDANSETRON HCL 4 MG PO TABS: 4.0000 mg | ORAL_TABLET | Freq: Four times a day (QID) | ORAL | Status: DC | PRN

## 2013-03-26 MED ORDER — HYDROCODONE-ACETAMINOPHEN 5-325 MG PO TABS: 1.0000 | ORAL_TABLET | ORAL | Status: DC | PRN

## 2013-03-26 NOTE — Progress Notes (Signed)
Discharge instructions and prescriptions given, verbalized understanding, out in stable condition ambulatory with staff. 

## 2013-03-26 NOTE — Discharge Summary (Signed)
Patient seen and examined.  Agree with note as above per Dyanne Carrel, NP.  Patient admitted with ureter obstruction with forniceal rupture and extravasation of urine. She has been followed by Dr. Michela Pitcher.  She was observed in the hospital and did not have any signs of decompensation.  She has remained afebrile, non toxic and abdominal pain has improved.  She has been cleared for discharge by urology and will follow up as an outpatient.  Jenice Leiner

## 2013-03-26 NOTE — Consult Note (Signed)
Kristy Jarvis, Kristy Jarvis            ACCOUNT NO.:  [id number]  MEDICAL RECORD NO.:  [id number]  LOCATION:  A306                          FACILITY:  [facility]  PHYSICIAN:  Marissa Nestle, M.D.DATE OF BIRTH:  June 19, 1920  DATE OF CONSULTATION: DATE OF DISCHARGE:  03/26/2013                                CONSULTATION   CHIEF COMPLAINT:  Left renal colic.  HISTORY:  An 79 year old female came to the emergency room with nausea, vomiting, severe pain in her left flank.  It started 9 o'clock last night and the pain got worse, so she came to the emergency room where CT scan showed that she has had stone in the distal mm calculus in the proximal third of the left ureter immediately beyond the left ureterovesical junction.  Causing mild hydroureteronephrosis, and extensive perinephric fluid and stranding which has appeared most suggestive of recent forniceal rupture.  This fluid tracks inferiorly through the retroperitoneum along the anterior surface of the left serratus muscle extending across nearly to the midline.  No additional calculi noted within the left renal collecting system or the ulcer in the left ureter, 4 mm nonobstructing calculus in the lower pole of the right kidney noted.  No right ureteral calculus is seen.  By the time, I came to see her today, she is feeling much better, having no pain.  She is afebrile.  Her temperature is 98.3, her pulse is 60 per minute, blood pressure 113/50, respiration 18 per minute, O2 saturation room air is 100%.  Her WBC count is 7.6, hematocrit is 42.2%.  Sodium 140, potassium 3.6, chloride 99, CO2 is 26, BUN is 18, creatinine 1.04, calcium is slightly elevated to 10.5.  She is completely asymptomatic and now she is having no pain.  PAST MEDICAL HISTORY:  She did pass a kidney stone several years ago. She has a history of having hysterectomy many years ago and also had got cholecystectomy.  PERSONAL HISTORY:  She does not smoke or  drink.  REVIEW OF SYSTEMS:  Unremarkable.  PHYSICAL EXAMINATION:  GENERAL:  Moderately built female, not in acute distress, fully conscious, alert, oriented. VITAL SIGNS:  Blood pressure 113/50, temperature 98.3. ABDOMEN:  Soft, flat.  Liver, spleen, kidneys are not palpable.  Deep tenderness in right question left flank, 1+ left CVA tenderness. PELVIC:  Deferred. EXTREMITIES:  Normal.  IMPRESSION:  Mid ureteral 2 mm calculus on the left side with urinary extravasation.  Of course, there is a concern about the urinary extravasation, but clinically she is doing well.  So, I am going to leave it alone.  Continue her IV fluids and following that and analgesia, if she is like this tomorrow, she can be discharged home tomorrow with pain medicine.  I can follow her in the office.  She need to repeat her CBC and BMET with calcium in the morning.     Marissa Nestle, M.D.     MIJ/MEDQ  D:  03/25/2013  T:  03/26/2013  Job:  [id number]

## 2013-03-26 NOTE — Discharge Summary (Signed)
Physician Discharge Summary  Kristy Jarvis [id number] DOB: 10-26-1920 DOA: 03/25/2013  PCP: Tula Nakayama, MD  Admit date: 03/25/2013 Discharge date: 03/26/2013  Time spent: 40 minutes  Recommendations for Outpatient Follow-up:  1. Follow up with Dr Michela Pitcher 04/03/13.  Discharge Diagnoses:  Principal Problem:   Ureter obstruction Active Problems:   HYPERLIPIDEMIA   GERD   MCI (mild cognitive impairment)   Nausea & vomiting   Renal calculi   Hyperglycemia   Hydronephrosis   Abdominal  pain, other specified site   Discharge Condition: stable  Diet recommendation: regular  Filed Weights   03/25/13 0116 03/25/13 0823 03/25/13 0832  Weight: 47.628 kg (105 lb) 49.5 kg (109 lb 2 oz) 49.5 kg (109 lb 2 oz)    History of present illness:  Kristy Jarvis is a very pleasant 79 y.o. female with a past medical history that includes renal colic, hyperlipidemia, GERD, diverticulosis of colon, vital hernia presented to emergency department on 03/25/13 with chief complaint of abdominal pain with nausea and vomiting. Patient indicated she was in her usual state of health until day prior to presentation around 9 PM when she developed sudden sharp right-sided abdominal pain. She stated that the pain was constant. Shortly thereafter she developed nausea and had 2 episodes of emesis before calling her daughter. She stated that she paced the floor for relief but this was not helpful.she reportd that her emesis was mostly clear she denied any coffee ground emesis or blood tinged emesis.she denied any dysuria hematuria frequency or urgency. She denied constipation diarrhea melena hematochezia. She denied fever chills shortness of breath .she reportd she went to Dr. Moshe Cipro her PCP just last month and "everything was okay" . In the emurgency room she had a serum glucose of 154 otherwise lab work was unremarkable. CT of the abdomen yielded a left ureteropelvic junction obstruction with mild  hydronephrosis and forniceal rupture with extravasation of urine. She was hemodynamically stable. Urology was consulted and hospitalists asked to admit.    Hospital Course:  Principal Problem:  Ureter obstruction: With forniceal rupture with extravasation of urine. Admited to telemetry. Urology consult appreciated. Urine output good. VSS. Urology recommended discharge to home when pain/nausea resolved with follow up in 1 week.    Active Problems:  Hydronephrosis: left per CT mild proximal hydronephrosis at this time, there is  extensive perinephric fluid and stranding secondary to #1. Patient pemained afebrile and white count was within the limits of normal. Good urine output. BUN/cratinine within the limits of normal. Follow up with Dr. Michela Pitcher 1 week.   Nausea & vomiting: Related to above. Provided with Zofran and pain med. On day of discharge pain resolved and tolerating regular diet. Will discharge with some pain med and anti-emetic.   Abdominal pain: due to above. Improved quickly. On day of discharge resolve.    Hyperglycemia: no hx diabetes. A1c 5.9. Outpatient follow up with PCP   Renal calculi: hx of same. See #1 also non-obstructive on right.   HYPERLIPIDEMIA: chart review indicates PCP visit last month. Continue statin when #3 resolved   GERD: stable at baseline. Continue PPI    MCI (mild cognitive impairment): appear stable at baseline  Dr Ranae Plumber per ED MD   Procedures:  none  Consultations:  Dr. Michela Pitcher  Discharge Exam: Filed Vitals:   03/26/13 0447  BP: 113/53  Pulse: 56  Temp: 98.3 F (36.8 C)  Resp: 20    General: well nourished  Cardiovascular: RRR No No LE edema Respiratory:  normal effort  BS Clear bilaterally no wheeze   Discharge Instructions   Future Appointments Provider Department Dept Phone   07/30/2013 9:00 AM Fayrene Helper, MD [facility] [phone]   Patient should bring all necessary paperwork to be completed.  Arrive 15  minutes prior to the appointment.       Medication List         acetaminophen 500 MG tablet  Commonly known as:  TYLENOL  Take 500 mg by mouth every 6 (six) hours as needed. Pain     ASPIRIN LOW DOSE 81 MG EC tablet  Generic drug:  aspirin  Take 81 mg by mouth daily.     Calcium Lactate 750 MG Tabs  Take 1 tablet by mouth 2 (two) times daily.     HYDROcodone-acetaminophen 5-325 MG per tablet  Commonly known as:  NORCO/VICODIN  Take 1-2 tablets by mouth every 4 (four) hours as needed for moderate pain.     lovastatin 40 MG tablet  Commonly known as:  MEVACOR  Take 1 tablet (40 mg total) by mouth at bedtime.     omeprazole 20 MG capsule  Commonly known as:  PRILOSEC  TAKE 1 CAPSULE (20 MG TOTAL) BY MOUTH DAILY.     ondansetron 4 MG tablet  Commonly known as:  ZOFRAN  Take 1 tablet (4 mg total) by mouth every 6 (six) hours as needed for nausea.     Vitamin D 400 UNITS capsule  Take 800 Units by mouth daily.       Allergies  Allergen Reactions  . Alendronate Sodium Other (See Comments)    Chest Pain, Fatigue   . Penicillins Rash       Follow-up Information   Follow up with Marissa Nestle, MD On 04/03/2013. (appointment at 3:15.)    Specialty:  Urology   Contact information:   [address] [place] [id number] [phone]        The results of significant diagnostics from this hospitalization (including imaging, microbiology, ancillary and laboratory) are listed below for reference.    Significant Diagnostic Studies: Ct Abdomen Pelvis W Contrast  03/25/2013   CLINICAL DATA:  Left lower quadrant abdominal pain. Nausea and vomiting  EXAM: CT ABDOMEN AND PELVIS WITH CONTRAST  TECHNIQUE: Multidetector CT imaging of the abdomen and pelvis was performed using the standard protocol following bolus administration of intravenous contrast.  CONTRAST:  62mL OMNIPAQUE IOHEXOL 300 MG/ML SOLN, 138mL OMNIPAQUE IOHEXOL 300 MG/ML SOLN  COMPARISON:  CT of the  abdomen and pelvis 09/16/2010.  FINDINGS: Lung Bases: Large hiatal hernia  Abdomen/Pelvis: Image 33 series 2 demonstrates a 2 mm calculus in the proximal 3rd of the left ureter or immediately beyond the left ureterovesicular junction. This is associated with significant obstruction as evidenced by mild left hydroureteronephrosis and extensive perinephric fluid and stranding, which has appearance most suggestive of recent forniceal rupture. This fluid tracks inferiorly through the retroperitoneum along the anterior surface of the left psoas muscle extending across nearly to the midline. No additional calculi are noted within the left renal collecting system or elsewhere in the left ureter. 4 mm nonobstructive calculus in the lower pole collecting system of the right kidney noted. No right ureteral stones are identified at this time. No calculi within the urinary bladder. Postcontrast delayed images demonstrate some anterior extravasation urine from the left renal collecting system.  Status post cholecystectomy. The appearance of the liver, pancreas, spleen and bilateral adrenal glands is unremarkable. Extensive atherosclerosis throughout the  abdominal and pelvic vasculature, without evidence of aneurysm or dissection No significant volume of ascites. No pneumoperitoneum. No pathologic distention of small bowel. Numerous colonic diverticulae are noted, particularly in the region of the sigmoid colon, without surrounding inflammatory changes to suggest an acute diverticulitis at this time. No definite lymphadenopathy identified within the abdomen or pelvis.  Musculoskeletal: There are no aggressive appearing lytic or blastic lesions noted in the visualized portions of the skeleton.  IMPRESSION: 1. Obstructive 2 mm stone in the proximal 3rd of the left ureter at or immediately beyond the left ureteropelvic junction. Although there is only mild proximal hydronephrosis at this time, there is extensive perinephric fluid  and stranding, and there is evidence of forniceal rupture with extravasation of urine on delayed images extending anteriorly, as above 2. Nonobstructive 4 mm calculus in the lower pole collecting system of the right kidney incidentally noted. 3. Status post cholecystectomy. 4. Colonic diverticulosis without evidence to suggest acute diverticulitis at this time. 5. Large hiatal hernia 6. Extensive atherosclerosis.   Electronically Signed   By: Vinnie Langton M.D.   On: 03/25/2013 05:55    Microbiology: No results found for this or any previous visit (from the past 240 hour(s)).   Labs: Basic Metabolic Panel:  Recent Labs Lab 03/25/13 0149 03/26/13 0500  NA 140 144  K 3.6 3.6  CL 99 109  CO2 26 27  GLUCOSE 154* 92  BUN 18 12  CREATININE 1.04 1.02  CALCIUM 10.5 8.8   Liver Function Tests:  Recent Labs Lab 03/26/13 0500  AST 24  ALT 14  ALKPHOS 70  BILITOT 0.5  PROT 5.5*  ALBUMIN 2.8*   No results found for this basename: LIPASE, AMYLASE,  in the last 168 hours No results found for this basename: AMMONIA,  in the last 168 hours CBC:  Recent Labs Lab 03/25/13 0149 03/26/13 0500  WBC 7.6 5.7  NEUTROABS 6.1  --   HGB 14.0 12.3  HCT 42.2 38.2  MCV 91.3 93.2  PLT 246 218   Cardiac Enzymes: No results found for this basename: CKTOTAL, CKMB, CKMBINDEX, TROPONINI,  in the last 168 hours BNP: BNP (last 3 results) No results found for this basename: PROBNP,  in the last 8760 hours CBG: No results found for this basename: GLUCAP,  in the last 168 hours     Signed:  Radene Gunning  [facility] 03/26/2013, 10:25 AM

## 2013-03-26 NOTE — Progress Notes (Signed)
UR chart review completed.  

## 2013-05-13 ENCOUNTER — Other Ambulatory Visit: Payer: Self-pay | Admitting: Family Medicine

## 2013-07-23 LAB — CBC WITH DIFFERENTIAL/PLATELET
Basophils Absolute: 0.1 10*3/uL (ref 0.0–0.1)
Basophils Relative: 1 % (ref 0–1)
Eosinophils Absolute: 0.2 10*3/uL (ref 0.0–0.7)
Eosinophils Relative: 4 % (ref 0–5)
HCT: 41.7 % (ref 36.0–46.0)
Hemoglobin: 14.2 g/dL (ref 12.0–15.0)
Lymphocytes Relative: 46 % (ref 12–46)
Lymphs Abs: 3 10*3/uL (ref 0.7–4.0)
MCH: 30.1 pg (ref 26.0–34.0)
MCHC: 34.1 g/dL (ref 30.0–36.0)
MCV: 88.5 fL (ref 78.0–100.0)
Monocytes Absolute: 0.5 10*3/uL (ref 0.1–1.0)
Monocytes Relative: 8 % (ref 3–12)
Neutro Abs: 2.7 10*3/uL (ref 1.7–7.7)
Neutrophils Relative %: 41 % — ABNORMAL LOW (ref 43–77)
Platelets: 228 10*3/uL (ref 150–400)
RBC: 4.71 MIL/uL (ref 3.87–5.11)
RDW: 13.6 % (ref 11.5–15.5)
WBC: 6.4 10*3/uL (ref 4.0–10.5)

## 2013-07-23 LAB — COMPREHENSIVE METABOLIC PANEL
ALT: 12 U/L (ref 0–35)
AST: 21 U/L (ref 0–37)
Albumin: 3.7 g/dL (ref 3.5–5.2)
Alkaline Phosphatase: 76 U/L (ref 39–117)
BUN: 14 mg/dL (ref 6–23)
CO2: 28 mEq/L (ref 19–32)
Calcium: 9.4 mg/dL (ref 8.4–10.5)
Chloride: 105 mEq/L (ref 96–112)
Creat: 0.91 mg/dL (ref 0.50–1.10)
Glucose, Bld: 97 mg/dL (ref 70–99)
Potassium: 4.1 mEq/L (ref 3.5–5.3)
Sodium: 143 mEq/L (ref 135–145)
Total Bilirubin: 0.6 mg/dL (ref 0.2–1.2)
Total Protein: 6.4 g/dL (ref 6.0–8.3)

## 2013-07-23 LAB — LIPID PANEL
Cholesterol: 165 mg/dL (ref 0–200)
HDL: 74 mg/dL (ref >39)
LDL Cholesterol: 63 mg/dL (ref 0–99)
Total CHOL/HDL Ratio: 2.2 Ratio
Triglycerides: 138 mg/dL (ref <150)
VLDL: 28 mg/dL (ref 0–40)

## 2013-07-23 LAB — TSH: TSH: 2.765 u[IU]/mL (ref 0.350–4.500)

## 2013-07-24 LAB — VITAMIN D 25 HYDROXY (VIT D DEFICIENCY, FRACTURES): Vit D, 25-Hydroxy: 34 ng/mL (ref 30–89)

## 2013-07-30 ENCOUNTER — Ambulatory Visit (INDEPENDENT_AMBULATORY_CARE_PROVIDER_SITE_OTHER): Payer: Medicare Other | Admitting: Family Medicine

## 2013-07-30 ENCOUNTER — Encounter: Payer: Self-pay | Admitting: Family Medicine

## 2013-07-30 ENCOUNTER — Encounter (INDEPENDENT_AMBULATORY_CARE_PROVIDER_SITE_OTHER): Payer: Self-pay

## 2013-07-30 VITALS — BP 128/70 | HR 67 | Resp 16 | Ht 61.0 in | Wt 106.8 lb

## 2013-07-30 DIAGNOSIS — Z0000 Encounter for general adult medical examination without abnormal findings: Secondary | ICD-10-CM

## 2013-07-30 DIAGNOSIS — Z1239 Encounter for other screening for malignant neoplasm of breast: Secondary | ICD-10-CM

## 2013-07-30 DIAGNOSIS — Z13820 Encounter for screening for osteoporosis: Secondary | ICD-10-CM

## 2013-07-30 NOTE — Patient Instructions (Addendum)
F/u in mid September, call if you need me before  PLEASE check [place] about hearing aids, locally, if not available let us know we will refer you to [facility] will be referred fo a bone density scan and your mammogram will be scheduuled for July , when it is due  Labs are excellent , so no med changes  Fall Prevention and Home Safety Falls cause injuries and can affect all age groups. It is possible to prevent falls.  HOW TO PREVENT FALLS  Wear shoes with rubber soles that do not have an opening for your toes.  Keep the inside and outside of your house well lit.  Use night lights throughout your home.  Remove clutter from floors.  Clean up floor spills.  Remove throw rugs or fasten them to the floor with carpet tape.  Do not place electrical cords across pathways.  Put grab bars by your tub, shower, and toilet. Do not use towel bars as grab bars.  Put handrails on both sides of the stairway. Fix loose handrails.  Do not climb on stools or stepladders, if possible.  Do not wax your floors.  Repair uneven or unsafe sidewalks, walkways, or stairs.  Keep items you use a lot within reach.  Be aware of pets.  Keep emergency numbers next to the telephone.  Put smoke detectors in your home and near bedrooms. Ask your doctor what other things you can do to prevent falls. Document Released: 03/04/2009 Document Revised: 11/07/2011 Document Reviewed: 08/08/2011 [facility] Patient Information 2014 [place], [place].

## 2013-07-30 NOTE — Progress Notes (Signed)
Subjective:    Patient ID: Kristy Jarvis, female    DOB: 1920/12/18, 80 y.o.   MRN: [id number]  HPI Preventive Screening-Counseling & Management   Patient present here today for a Medicare annual wellness visit.   Current Problems (verified)   Medications Prior to Visit Allergies (verified)   PAST HISTORY  Family History  Social History Widow since 1993, Mom of one daughter.who is attentive to her. luives alone , never nicotine   Risk Factors  Current exercise habits:  Active for over 30 minutes every day  Dietary issues discussed:heart healthy , low carb diet   Cardiac risk factors:   Depression Screen  (Note: if answer to either of the following is "Yes", a more complete depression screening is indicated)   Over the past two weeks, have you felt down, depressed or hopeless? No  Over the past two weeks, have you felt little interest or pleasure in doing things? No  Have you lost interest or pleasure in daily life? No  Do you often feel hopeless? No  Do you cry easily over simple problems? No   Activities of Daily Living  In your present state of health, do you have any difficulty performing the following activities?  Driving?: No Managing money?: No Feeding yourself?:No Getting from bed to chair?:No Climbing a flight of stairs?:No Preparing food and eating?:No Bathing or showering?:No Getting dressed?:No Getting to the toilet?:No Using the toilet?:No Moving around from place to place?: No  Fall Risk Assessment In the past year have you fallen or had a near fall?:No Are you currently taking any medications that make you dizzy?:No   Hearing Difficulties: No Do you often ask people to speak up or repeat themselves?:yes hearing loss in right ear would benefit from hearing aid Do you experience ringing or noises in your ears?:No Do you have difficulty understanding soft or whispered voices?:yes  Cognitive Testing  Alert? Yes Normal Appearance?Yes    Oriented to person? Yes Place? Yes  Time? Yes  Displays appropriate judgment?Yes  Can read the correct time from a watch face? yes Are you having problems remembering things?No  Advanced Directives have been discussed with the patient?Yes , full code   List the Names of Other Physician/Practitioners you currently use: none   Indicate any recent Medical Services you may have received from other than [facility] providers in the past year (date may be approximate).   Assessment:    Annual Wellness Exam   Plan:    During the course of the visit the patient was educated and counseled about appropriate screening and preventive services including:  A healthy diet is rich in fruit, vegetables and whole grains. Poultry fish, nuts and beans are a healthy choice for protein rather then red meat. A low sodium diet and drinking 64 ounces of water daily is generally recommended. Oils and sweet should be limited. Carbohydrates especially for those who are diabetic or overweight, should be limited to 30-45 gram per meal. It is important to eat on a regular schedule, at least 3 times daily. Snacks should be primarily fruits, vegetables or nuts. It is important that you exercise regularly at least 30 minutes 5 times a week. If you develop chest pain, have severe difficulty breathing, or feel very tired, stop exercising immediately and seek medical attention  Immunization reviewed and updated. Cancer screening reviewed and updated    Patient Instructions (the written plan) was given to the patient.  Medicare Attestation  I have personally reviewed:  The  patient's medical and social history  Their use of alcohol, tobacco or illicit drugs  Their current medications and supplements  The patient's functional ability including ADLs,fall risks, home safety risks, cognitive, and hearing and visual impairment  Diet and physical activities  Evidence for depression or mood disorders  The patient's weight,  height, BMI, and visual acuity have been recorded in the chart. I have made referrals, counseling, and provided education to the patient based on review of the above and I have provided the patient with a written personalized care plan for preventive services.      Review of Systems     Objective:   Physical Exam        Assessment & Plan:  Routine general medical examination at a health care facility Annual exam as documented. Counseling done  re healthy lifestyle involving commitment to 150 minutes exercise per week, heart healthy diet, and attaining healthy weight.The importance of adequate sleep also discussed. Regular seat belt use and safe storage  of firearms if patient has them, is also discussed. Changes in health habits are decided on by the patient with goals and time frames  set for achieving them. Immunization and cancer screening needs are specifically addressed at this visit. Fall prevention also discussed and info provided

## 2013-08-03 DIAGNOSIS — Z0000 Encounter for general adult medical examination without abnormal findings: Secondary | ICD-10-CM | POA: Insufficient documentation

## 2013-08-03 NOTE — Assessment & Plan Note (Signed)
Annual exam as documented. Counseling done  re healthy lifestyle involving commitment to 150 minutes exercise per week, heart healthy diet, and attaining healthy weight.The importance of adequate sleep also discussed. Regular seat belt use and safe storage  of firearms if patient has them, is also discussed. Changes in health habits are decided on by the patient with goals and time frames  set for achieving them. Immunization and cancer screening needs are specifically addressed at this visit. Fall prevention also discussed and info provided

## 2013-08-11 ENCOUNTER — Ambulatory Visit (HOSPITAL_COMMUNITY)
Admission: RE | Admit: 2013-08-11 | Discharge: 2013-08-11 | Disposition: A | Discharge status: Home / Self Care | Payer: Medicare Other | Source: Ambulatory Visit | Attending: Family Medicine | Admitting: Family Medicine

## 2013-08-11 DIAGNOSIS — Z1231 Encounter for screening mammogram for malignant neoplasm of breast: Secondary | ICD-10-CM | POA: Insufficient documentation

## 2013-08-11 DIAGNOSIS — Z1239 Encounter for other screening for malignant neoplasm of breast: Secondary | ICD-10-CM

## 2013-08-11 DIAGNOSIS — Z13820 Encounter for screening for osteoporosis: Secondary | ICD-10-CM

## 2013-08-11 DIAGNOSIS — M810 Age-related osteoporosis without current pathological fracture: Secondary | ICD-10-CM | POA: Insufficient documentation

## 2013-08-12 ENCOUNTER — Encounter: Payer: Self-pay | Admitting: Family Medicine

## 2013-08-13 ENCOUNTER — Other Ambulatory Visit: Payer: Self-pay

## 2013-08-13 MED ORDER — RISEDRONATE SODIUM 35 MG PO TABS: 35.0000 mg | ORAL_TABLET | ORAL | Status: DC

## 2013-11-13 ENCOUNTER — Other Ambulatory Visit: Payer: Self-pay | Admitting: Family Medicine

## 2013-12-02 ENCOUNTER — Other Ambulatory Visit: Payer: Self-pay | Admitting: Family Medicine

## 2013-12-10 ENCOUNTER — Other Ambulatory Visit: Payer: Self-pay | Admitting: Family Medicine

## 2013-12-11 ENCOUNTER — Other Ambulatory Visit: Payer: Self-pay | Admitting: Family Medicine

## 2013-12-18 ENCOUNTER — Encounter (INDEPENDENT_AMBULATORY_CARE_PROVIDER_SITE_OTHER): Payer: Self-pay

## 2013-12-18 ENCOUNTER — Ambulatory Visit (INDEPENDENT_AMBULATORY_CARE_PROVIDER_SITE_OTHER): Payer: Medicare Other | Admitting: Family Medicine

## 2013-12-18 ENCOUNTER — Encounter: Payer: Self-pay | Admitting: Family Medicine

## 2013-12-18 VITALS — BP 158/74 | HR 65 | Resp 16 | Ht 61.0 in | Wt 101.8 lb

## 2013-12-18 DIAGNOSIS — K219 Gastro-esophageal reflux disease without esophagitis: Secondary | ICD-10-CM

## 2013-12-18 DIAGNOSIS — L02419 Cutaneous abscess of limb, unspecified: Secondary | ICD-10-CM

## 2013-12-18 DIAGNOSIS — L03119 Cellulitis of unspecified part of limb: Secondary | ICD-10-CM

## 2013-12-18 DIAGNOSIS — L0390 Cellulitis, unspecified: Secondary | ICD-10-CM | POA: Insufficient documentation

## 2013-12-18 MED ORDER — LEVOFLOXACIN 500 MG PO TABS: 500.0000 mg | ORAL_TABLET | Freq: Every day | ORAL | Status: DC

## 2013-12-18 NOTE — Patient Instructions (Addendum)
F/u as before, call if you need me sooner  You are treated for skin infection, you have little pustules in different areas of your body.  Do NOT scratch  Take antibiotic, one very day for 5 days, and continuie the medication for itching  As long as you need it every day.  Keep skin clean with soap and water, do not put extra topical creams on rash please

## 2013-12-20 HISTORY — PX: HIP SURGERY: SHX245

## 2013-12-21 NOTE — Progress Notes (Signed)
   Subjective:    Patient ID: Kristy Jarvis, female    DOB: 03-Jan-1921, 80 y.o.   MRN: [id number]  HPI 3 day h/o itchy rash in crops, which started on inner thighs and is now present on abdomen and upper arms also. Some appear as pustules,  They are warm and tender. First started after sitting outside one evening , the next day rash started,denies fever or chills, no one else is affected Otherwise well   Review of Systems See HPI Denies recent fever or chills. Denies sinus pressure, nasal congestion, ear pain or sore throat. Denies chest congestion, productive cough or wheezing. Denies chest pains, palpitations and leg swelling Denies abdominal pain, nausea, vomiting,diarrhea or constipation.          Objective:   Physical Exam  BP 158/74  Pulse 65  Resp 16  Ht 5\' 1"  (1.549 m)  Wt 101 lb 12.8 oz (46.176 kg)  BMI 19.24 kg/m2  SpO2 98% Patient alert and oriented and in no cardiopulmonary distress.  HEENT: No facial asymmetry, EOMI,   oropharynx pink and moist.  Neck supple no JVD, no mass.  Chest: Clear to auscultation bilaterally.  CVS: S1, S2 no murmurs, no S3.Regular rate.  ABD: Soft non tender.   Ext: No edema  MS: Adequate ROM spine, shoulders, hips and knees.  Skin: Intact, erythematous macular papular rash in crops, affecting abovementioned areas, few pustules seen also, warm and tender   CNS: CN 2-12 intact, power,  normal throughout.no focal deficits noted.       Assessment & Plan:  Cellulitis Acute skin infection in crops, oral antibiotic for 5 days  GERD Controlled, no change in medication

## 2013-12-21 NOTE — Assessment & Plan Note (Signed)
Controlled, no change in medication  

## 2013-12-21 NOTE — Assessment & Plan Note (Signed)
Acute skin infection in crops, oral antibiotic for 5 days

## 2014-01-02 ENCOUNTER — Encounter (HOSPITAL_COMMUNITY)
Admission: EM | Disposition: A | Discharge status: Skilled Nursing Facility | Payer: Self-pay | Source: Home / Self Care | Attending: Internal Medicine

## 2014-01-02 ENCOUNTER — Encounter (HOSPITAL_COMMUNITY): Payer: Self-pay | Admitting: Emergency Medicine

## 2014-01-02 ENCOUNTER — Inpatient Hospital Stay (HOSPITAL_COMMUNITY)
Admission: EM | Admit: 2014-01-02 | Discharge: 2014-01-05 | DRG: 481 | Disposition: A | Discharge status: Skilled Nursing Facility | Payer: Medicare Other | Attending: Internal Medicine | Admitting: Internal Medicine

## 2014-01-02 ENCOUNTER — Emergency Department (HOSPITAL_COMMUNITY): Payer: Medicare Other

## 2014-01-02 ENCOUNTER — Inpatient Hospital Stay (HOSPITAL_COMMUNITY): Payer: Medicare Other

## 2014-01-02 ENCOUNTER — Encounter (HOSPITAL_COMMUNITY): Payer: Medicare Other | Admitting: Anesthesiology

## 2014-01-02 ENCOUNTER — Inpatient Hospital Stay (HOSPITAL_COMMUNITY): Payer: Medicare Other | Admitting: Anesthesiology

## 2014-01-02 DIAGNOSIS — R4181 Age-related cognitive decline: Secondary | ICD-10-CM | POA: Diagnosis present

## 2014-01-02 DIAGNOSIS — K219 Gastro-esophageal reflux disease without esophagitis: Secondary | ICD-10-CM | POA: Diagnosis present

## 2014-01-02 DIAGNOSIS — Z79899 Other long term (current) drug therapy: Secondary | ICD-10-CM

## 2014-01-02 DIAGNOSIS — Z888 Allergy status to other drugs, medicaments and biological substances status: Secondary | ICD-10-CM

## 2014-01-02 DIAGNOSIS — S72009A Fracture of unspecified part of neck of unspecified femur, initial encounter for closed fracture: Secondary | ICD-10-CM | POA: Diagnosis present

## 2014-01-02 DIAGNOSIS — Z833 Family history of diabetes mellitus: Secondary | ICD-10-CM | POA: Diagnosis not present

## 2014-01-02 DIAGNOSIS — S7223XA Displaced subtrochanteric fracture of unspecified femur, initial encounter for closed fracture: Secondary | ICD-10-CM | POA: Diagnosis present

## 2014-01-02 DIAGNOSIS — N135 Crossing vessel and stricture of ureter without hydronephrosis: Secondary | ICD-10-CM

## 2014-01-02 DIAGNOSIS — E785 Hyperlipidemia, unspecified: Secondary | ICD-10-CM | POA: Diagnosis present

## 2014-01-02 DIAGNOSIS — S72142A Displaced intertrochanteric fracture of left femur, initial encounter for closed fracture: Secondary | ICD-10-CM

## 2014-01-02 DIAGNOSIS — M25469 Effusion, unspecified knee: Secondary | ICD-10-CM | POA: Diagnosis present

## 2014-01-02 DIAGNOSIS — N200 Calculus of kidney: Secondary | ICD-10-CM

## 2014-01-02 DIAGNOSIS — E440 Moderate protein-calorie malnutrition: Secondary | ICD-10-CM | POA: Diagnosis present

## 2014-01-02 DIAGNOSIS — M810 Age-related osteoporosis without current pathological fracture: Secondary | ICD-10-CM | POA: Diagnosis present

## 2014-01-02 DIAGNOSIS — Z8249 Family history of ischemic heart disease and other diseases of the circulatory system: Secondary | ICD-10-CM

## 2014-01-02 DIAGNOSIS — R109 Unspecified abdominal pain: Secondary | ICD-10-CM

## 2014-01-02 DIAGNOSIS — Z0000 Encounter for general adult medical examination without abnormal findings: Secondary | ICD-10-CM

## 2014-01-02 DIAGNOSIS — W010XXA Fall on same level from slipping, tripping and stumbling without subsequent striking against object, initial encounter: Secondary | ICD-10-CM | POA: Diagnosis present

## 2014-01-02 DIAGNOSIS — S7222XA Displaced subtrochanteric fracture of left femur, initial encounter for closed fracture: Secondary | ICD-10-CM

## 2014-01-02 DIAGNOSIS — M25559 Pain in unspecified hip: Secondary | ICD-10-CM | POA: Diagnosis present

## 2014-01-02 DIAGNOSIS — R42 Dizziness and giddiness: Secondary | ICD-10-CM

## 2014-01-02 DIAGNOSIS — S72002A Fracture of unspecified part of neck of left femur, initial encounter for closed fracture: Secondary | ICD-10-CM

## 2014-01-02 DIAGNOSIS — R739 Hyperglycemia, unspecified: Secondary | ICD-10-CM

## 2014-01-02 DIAGNOSIS — Z800 Family history of malignant neoplasm of digestive organs: Secondary | ICD-10-CM

## 2014-01-02 DIAGNOSIS — R1319 Other dysphagia: Secondary | ICD-10-CM

## 2014-01-02 DIAGNOSIS — R1310 Dysphagia, unspecified: Secondary | ICD-10-CM

## 2014-01-02 DIAGNOSIS — D62 Acute posthemorrhagic anemia: Secondary | ICD-10-CM | POA: Diagnosis not present

## 2014-01-02 DIAGNOSIS — Z9849 Cataract extraction status, unspecified eye: Secondary | ICD-10-CM

## 2014-01-02 DIAGNOSIS — Z880 Allergy status to penicillin: Secondary | ICD-10-CM | POA: Diagnosis not present

## 2014-01-02 DIAGNOSIS — S72143A Displaced intertrochanteric fracture of unspecified femur, initial encounter for closed fracture: Secondary | ICD-10-CM

## 2014-01-02 DIAGNOSIS — G3184 Mild cognitive impairment, so stated: Secondary | ICD-10-CM

## 2014-01-02 DIAGNOSIS — Z7982 Long term (current) use of aspirin: Secondary | ICD-10-CM

## 2014-01-02 DIAGNOSIS — R110 Nausea: Secondary | ICD-10-CM | POA: Diagnosis not present

## 2014-01-02 DIAGNOSIS — Y9301 Activity, walking, marching and hiking: Secondary | ICD-10-CM

## 2014-01-02 HISTORY — PX: INTRAMEDULLARY (IM) NAIL INTERTROCHANTERIC: SHX5875

## 2014-01-02 LAB — CBC WITH DIFFERENTIAL/PLATELET
Basophils Absolute: 0.1 10*3/uL (ref 0.0–0.1)
Basophils Relative: 1 % (ref 0–1)
Eosinophils Absolute: 0.3 10*3/uL (ref 0.0–0.7)
Eosinophils Relative: 3 % (ref 0–5)
HCT: 41.9 % (ref 36.0–46.0)
HEMOGLOBIN: 14 g/dL (ref 12.0–15.0)
LYMPHS ABS: 2.1 10*3/uL (ref 0.7–4.0)
Lymphocytes Relative: 22 % (ref 12–46)
MCH: 30.2 pg (ref 26.0–34.0)
MCHC: 33.4 g/dL (ref 30.0–36.0)
MCV: 90.5 fL (ref 78.0–100.0)
MONOS PCT: 7 % (ref 3–12)
Monocytes Absolute: 0.6 10*3/uL (ref 0.1–1.0)
NEUTROS ABS: 6.3 10*3/uL (ref 1.7–7.7)
NEUTROS PCT: 67 % (ref 43–77)
PLATELETS: 220 10*3/uL (ref 150–400)
RBC: 4.63 MIL/uL (ref 3.87–5.11)
RDW: 13.8 % (ref 11.5–15.5)
WBC: 9.3 10*3/uL (ref 4.0–10.5)

## 2014-01-02 LAB — URINALYSIS, ROUTINE W REFLEX MICROSCOPIC
Bilirubin Urine: NEGATIVE
Glucose, UA: NEGATIVE mg/dL
HGB URINE DIPSTICK: NEGATIVE
Ketones, ur: NEGATIVE mg/dL
LEUKOCYTES UA: NEGATIVE
NITRITE: NEGATIVE
Protein, ur: NEGATIVE mg/dL
SPECIFIC GRAVITY, URINE: 1.02 (ref 1.005–1.030)
Urobilinogen, UA: 0.2 mg/dL (ref 0.0–1.0)
pH: 7.5 (ref 5.0–8.0)

## 2014-01-02 LAB — BASIC METABOLIC PANEL
Anion gap: 14 (ref 5–15)
BUN: 19 mg/dL (ref 6–23)
CHLORIDE: 104 meq/L (ref 96–112)
CO2: 26 mEq/L (ref 19–32)
Calcium: 9.5 mg/dL (ref 8.4–10.5)
Creatinine, Ser: 1.05 mg/dL (ref 0.50–1.10)
GFR calc non Af Amer: 45 mL/min — ABNORMAL LOW (ref >90)
GFR, EST AFRICAN AMERICAN: 53 mL/min — AB (ref >90)
Glucose, Bld: 126 mg/dL — ABNORMAL HIGH (ref 70–99)
Potassium: 4.1 mEq/L (ref 3.7–5.3)
SODIUM: 144 meq/L (ref 137–147)

## 2014-01-02 SURGERY — FIXATION, FRACTURE, INTERTROCHANTERIC, WITH INTRAMEDULLARY ROD
Anesthesia: General | Site: Hip | Laterality: Left

## 2014-01-02 MED ORDER — CALCIUM CARBONATE ANTACID 750 MG PO CHEW: 1.0000 | CHEWABLE_TABLET | Freq: Every day | ORAL | Status: DC

## 2014-01-02 MED ORDER — ONDANSETRON HCL 4 MG/2ML IJ SOLN
INTRAMUSCULAR | Status: AC
  Filled 2014-01-02: qty 2

## 2014-01-02 MED ORDER — FENTANYL CITRATE 0.05 MG/ML IJ SOLN
INTRAMUSCULAR | Status: AC
  Filled 2014-01-02: qty 5

## 2014-01-02 MED ORDER — PROPOFOL 10 MG/ML IV BOLUS
INTRAVENOUS | Status: AC
  Filled 2014-01-02: qty 20

## 2014-01-02 MED ORDER — HYDROCODONE-ACETAMINOPHEN 5-325 MG PO TABS
1.0000 | ORAL_TABLET | Freq: Four times a day (QID) | ORAL | Status: DC | PRN
  Filled 2014-01-02 (×2): qty 1

## 2014-01-02 MED ORDER — METHOCARBAMOL 1000 MG/10ML IJ SOLN
500.0000 mg | Freq: Four times a day (QID) | INTRAVENOUS | Status: DC | PRN
  Administered 2014-01-02: 500 mg via INTRAVENOUS
  Filled 2014-01-02: qty 5

## 2014-01-02 MED ORDER — MORPHINE SULFATE 2 MG/ML IJ SOLN
2.0000 mg | INTRAMUSCULAR | Status: DC | PRN
  Administered 2014-01-02: 2 mg via INTRAVENOUS
  Filled 2014-01-02: qty 1

## 2014-01-02 MED ORDER — ACETAMINOPHEN 325 MG PO TABS
650.0000 mg | ORAL_TABLET | Freq: Four times a day (QID) | ORAL | Status: DC | PRN
  Administered 2014-01-02 – 2014-01-05 (×5): 650 mg via ORAL
  Filled 2014-01-02 (×5): qty 2

## 2014-01-02 MED ORDER — MORPHINE SULFATE 2 MG/ML IJ SOLN
0.5000 mg | INTRAMUSCULAR | Status: DC | PRN
  Administered 2014-01-03: 0.5 mg via INTRAVENOUS
  Filled 2014-01-02: qty 1

## 2014-01-02 MED ORDER — DIPHENHYDRAMINE HCL 50 MG/ML IJ SOLN: INTRAMUSCULAR | Status: DC | PRN

## 2014-01-02 MED ORDER — CLINDAMYCIN PHOSPHATE 900 MG/50ML IV SOLN
INTRAVENOUS | Status: AC
  Administered 2014-01-02: 900 mg via INTRAVENOUS
  Filled 2014-01-02: qty 50

## 2014-01-02 MED ORDER — FENTANYL CITRATE 0.05 MG/ML IJ SOLN
INTRAMUSCULAR | Status: DC | PRN
  Administered 2014-01-02: 50 ug via INTRAVENOUS
  Administered 2014-01-02: 100 ug via INTRAVENOUS

## 2014-01-02 MED ORDER — DEXAMETHASONE SODIUM PHOSPHATE 10 MG/ML IJ SOLN
INTRAMUSCULAR | Status: DC | PRN
  Administered 2014-01-02: 4 mg via INTRAVENOUS

## 2014-01-02 MED ORDER — ONDANSETRON HCL 4 MG/2ML IJ SOLN
4.0000 mg | Freq: Four times a day (QID) | INTRAMUSCULAR | Status: DC | PRN
  Administered 2014-01-03: 4 mg via INTRAVENOUS
  Filled 2014-01-02: qty 2

## 2014-01-02 MED ORDER — PHENYLEPHRINE HCL 10 MG/ML IJ SOLN
INTRAMUSCULAR | Status: DC | PRN
  Administered 2014-01-02: 80 ug via INTRAVENOUS
  Administered 2014-01-02 (×3): 40 ug via INTRAVENOUS

## 2014-01-02 MED ORDER — MORPHINE SULFATE 2 MG/ML IJ SOLN: 0.5000 mg | INTRAMUSCULAR | Status: DC | PRN

## 2014-01-02 MED ORDER — CALCIUM CARBONATE ANTACID 500 MG PO CHEW
1.5000 | CHEWABLE_TABLET | Freq: Every day | ORAL | Status: DC
  Administered 2014-01-03 – 2014-01-05 (×3): 300 mg via ORAL
  Filled 2014-01-02 (×3): qty 1.5

## 2014-01-02 MED ORDER — ENOXAPARIN SODIUM 40 MG/0.4ML ~~LOC~~ SOLN
40.0000 mg | SUBCUTANEOUS | Status: DC
  Administered 2014-01-02: 40 mg via SUBCUTANEOUS
  Filled 2014-01-02 (×2): qty 0.4

## 2014-01-02 MED ORDER — CLINDAMYCIN PHOSPHATE 600 MG/50ML IV SOLN
600.0000 mg | Freq: Four times a day (QID) | INTRAVENOUS | Status: AC
  Administered 2014-01-02 – 2014-01-03 (×2): 600 mg via INTRAVENOUS
  Filled 2014-01-02 (×2): qty 50

## 2014-01-02 MED ORDER — LIDOCAINE HCL (CARDIAC) 20 MG/ML IV SOLN
INTRAVENOUS | Status: DC | PRN
  Administered 2014-01-02: 20 mg via INTRAVENOUS

## 2014-01-02 MED ORDER — SUCCINYLCHOLINE CHLORIDE 20 MG/ML IJ SOLN
INTRAMUSCULAR | Status: DC | PRN
  Administered 2014-01-02: 100 mg via INTRAVENOUS

## 2014-01-02 MED ORDER — MENTHOL 3 MG MT LOZG
1.0000 | LOZENGE | OROMUCOSAL | Status: DC | PRN
Start: 2014-01-02 — End: 2014-01-05

## 2014-01-02 MED ORDER — HYDROCODONE-ACETAMINOPHEN 5-325 MG PO TABS: 1.0000 | ORAL_TABLET | Freq: Four times a day (QID) | ORAL | Status: DC | PRN

## 2014-01-02 MED ORDER — ACETAMINOPHEN 650 MG RE SUPP: 650.0000 mg | Freq: Four times a day (QID) | RECTAL | Status: DC | PRN

## 2014-01-02 MED ORDER — FENTANYL CITRATE 0.05 MG/ML IJ SOLN
25.0000 ug | INTRAMUSCULAR | Status: DC | PRN
  Administered 2014-01-02: 50 ug via INTRAVENOUS

## 2014-01-02 MED ORDER — VITAMIN D 400 UNITS PO CAPS: 800.0000 [IU] | ORAL_CAPSULE | Freq: Every day | ORAL | Status: DC

## 2014-01-02 MED ORDER — METOCLOPRAMIDE HCL 10 MG PO TABS: 5.0000 mg | ORAL_TABLET | Freq: Three times a day (TID) | ORAL | Status: DC | PRN

## 2014-01-02 MED ORDER — ONDANSETRON HCL 4 MG/2ML IJ SOLN
4.0000 mg | Freq: Once | INTRAMUSCULAR | Status: AC | PRN
  Administered 2014-01-02: 4 mg via INTRAVENOUS

## 2014-01-02 MED ORDER — CHOLECALCIFEROL 10 MCG (400 UNIT) PO TABS
800.0000 [IU] | ORAL_TABLET | Freq: Every day | ORAL | Status: DC
  Administered 2014-01-03 – 2014-01-05 (×3): 800 [IU] via ORAL
  Filled 2014-01-02 (×3): qty 2

## 2014-01-02 MED ORDER — SIMVASTATIN 20 MG PO TABS
20.0000 mg | ORAL_TABLET | Freq: Every day | ORAL | Status: DC
  Administered 2014-01-03 – 2014-01-04 (×2): 20 mg via ORAL
  Filled 2014-01-02 (×3): qty 1

## 2014-01-02 MED ORDER — MORPHINE SULFATE 2 MG/ML IJ SOLN
2.0000 mg | INTRAMUSCULAR | Status: AC | PRN
  Administered 2014-01-02 (×2): 2 mg via INTRAVENOUS
  Filled 2014-01-02 (×2): qty 1

## 2014-01-02 MED ORDER — ONDANSETRON HCL 4 MG/2ML IJ SOLN
2.0000 mg | Freq: Once | INTRAMUSCULAR | Status: AC
  Administered 2014-01-02: 2 mg via INTRAVENOUS
  Filled 2014-01-02: qty 2

## 2014-01-02 MED ORDER — LACTATED RINGERS IV SOLN
INTRAVENOUS | Status: DC | PRN
  Administered 2014-01-02: 17:00:00 via INTRAVENOUS

## 2014-01-02 MED ORDER — METHOCARBAMOL 500 MG PO TABS
500.0000 mg | ORAL_TABLET | Freq: Four times a day (QID) | ORAL | Status: DC | PRN
  Administered 2014-01-04: 500 mg via ORAL
  Filled 2014-01-02 (×2): qty 1

## 2014-01-02 MED ORDER — METOCLOPRAMIDE HCL 5 MG/ML IJ SOLN: 5.0000 mg | Freq: Three times a day (TID) | INTRAMUSCULAR | Status: DC | PRN

## 2014-01-02 MED ORDER — DOCUSATE SODIUM 100 MG PO CAPS
100.0000 mg | ORAL_CAPSULE | Freq: Two times a day (BID) | ORAL | Status: DC
  Administered 2014-01-02 – 2014-01-05 (×6): 100 mg via ORAL
  Filled 2014-01-02 (×7): qty 1

## 2014-01-02 MED ORDER — ONDANSETRON HCL 4 MG PO TABS: 4.0000 mg | ORAL_TABLET | Freq: Four times a day (QID) | ORAL | Status: DC | PRN

## 2014-01-02 MED ORDER — PROPOFOL 10 MG/ML IV BOLUS
INTRAVENOUS | Status: DC | PRN
  Administered 2014-01-02: 120 mg via INTRAVENOUS

## 2014-01-02 MED ORDER — FENTANYL CITRATE 0.05 MG/ML IJ SOLN
INTRAMUSCULAR | Status: AC
  Filled 2014-01-02: qty 2

## 2014-01-02 MED ORDER — PHENOL 1.4 % MT LIQD
1.0000 | OROMUCOSAL | Status: DC | PRN
Start: 2014-01-02 — End: 2014-01-05

## 2014-01-02 MED ORDER — ASPIRIN 81 MG PO TBEC: 81.0000 mg | DELAYED_RELEASE_TABLET | Freq: Every day | ORAL | Status: DC

## 2014-01-02 MED ORDER — PANTOPRAZOLE SODIUM 40 MG PO TBEC
40.0000 mg | DELAYED_RELEASE_TABLET | Freq: Every day | ORAL | Status: DC
  Administered 2014-01-03 – 2014-01-05 (×3): 40 mg via ORAL
  Filled 2014-01-02 (×2): qty 1

## 2014-01-02 MED ORDER — ASPIRIN EC 325 MG PO TBEC
325.0000 mg | DELAYED_RELEASE_TABLET | Freq: Every day | ORAL | Status: DC
  Administered 2014-01-03 – 2014-01-05 (×3): 325 mg via ORAL
  Filled 2014-01-02 (×4): qty 1

## 2014-01-02 MED ORDER — ACETAMINOPHEN 500 MG PO TABS: 500.0000 mg | ORAL_TABLET | Freq: Four times a day (QID) | ORAL | Status: DC | PRN

## 2014-01-02 MED ORDER — ARTIFICIAL TEARS OP OINT
TOPICAL_OINTMENT | OPHTHALMIC | Status: DC | PRN
  Administered 2014-01-02: 1 via OPHTHALMIC

## 2014-01-02 MED ORDER — ASPIRIN EC 325 MG PO TBEC: 325.0000 mg | DELAYED_RELEASE_TABLET | Freq: Every day | ORAL | Status: DC

## 2014-01-02 MED ORDER — SODIUM CHLORIDE 0.9 % IV SOLN
INTRAVENOUS | Status: DC
  Administered 2014-01-02 – 2014-01-03 (×2): via INTRAVENOUS

## 2014-01-02 MED ORDER — SODIUM CHLORIDE 0.9 % IV SOLN
Freq: Once | INTRAVENOUS | Status: AC
  Administered 2014-01-02: 12:00:00 via INTRAVENOUS

## 2014-01-02 SURGICAL SUPPLY — 41 items
BIT DRILL CALIBRATED 4.2 (BIT) IMPLANT
BIT DRILL CANN 16 HIP (BIT) ×1 IMPLANT
BIT DRILL CANN 16MM HIP (BIT) ×1
BIT DRILL TAPERED 10 (BIT) ×1 IMPLANT
BIT DRILL TAPERED 10MM (BIT) ×1
BLADE HELICAL TFNA 90 HIP (Anchor) ×1 IMPLANT
BLADE HELICAL TFNA 90MM HIP (Anchor) ×1 IMPLANT
BLADE SURG 15 STRL LF DISP TIS (BLADE) ×1 IMPLANT
BLADE SURG 15 STRL SS (BLADE) ×3
COVER PERINEAL POST (MISCELLANEOUS) ×3 IMPLANT
COVER SURGICAL LIGHT HANDLE (MISCELLANEOUS) ×3 IMPLANT
COVER TABLE BACK 60X90 (DRAPES) ×3 IMPLANT
DRAPE C-ARM 42X72 X-RAY (DRAPES) ×3 IMPLANT
DRAPE STERI IOBAN 125X83 (DRAPES) ×3 IMPLANT
DRILL BIT CALIBRATED 4.2 (BIT) ×3
DRSG ADAPTIC 3X8 NADH LF (GAUZE/BANDAGES/DRESSINGS) ×3 IMPLANT
DRSG MEPILEX BORDER 4X4 (GAUZE/BANDAGES/DRESSINGS) ×3 IMPLANT
DRSG MEPILEX BORDER 4X8 (GAUZE/BANDAGES/DRESSINGS) ×3 IMPLANT
ELECT REM PT RETURN 9FT ADLT (ELECTROSURGICAL) ×3
ELECTRODE REM PT RTRN 9FT ADLT (ELECTROSURGICAL) ×1 IMPLANT
EVACUATOR 1/8 PVC DRAIN (DRAIN) IMPLANT
GLOVE BIOGEL PI IND STRL 9 (GLOVE) ×1 IMPLANT
GLOVE BIOGEL PI INDICATOR 9 (GLOVE) ×2
GLOVE SURG ORTHO 9.0 STRL STRW (GLOVE) ×3 IMPLANT
GOWN STRL REUS W/ TWL XL LVL3 (GOWN DISPOSABLE) ×3 IMPLANT
GOWN STRL REUS W/TWL XL LVL3 (GOWN DISPOSABLE) ×9
GUIDEWIRE 3.2X400 (WIRE) ×4 IMPLANT
KIT BASIN OR (CUSTOM PROCEDURE TRAY) ×3 IMPLANT
KIT ROOM TURNOVER OR (KITS) ×3 IMPLANT
LINER BOOT UNIVERSAL DISP (MISCELLANEOUS) ×3 IMPLANT
MANIFOLD NEPTUNE II (INSTRUMENTS) ×3 IMPLANT
NAIL TROCH FIX 10X170 130 (Nail) ×2 IMPLANT
NS IRRIG 1000ML POUR BTL (IV SOLUTION) ×3 IMPLANT
PACK GENERAL/GYN (CUSTOM PROCEDURE TRAY) ×3 IMPLANT
PAD ARMBOARD 7.5X6 YLW CONV (MISCELLANEOUS) ×6 IMPLANT
SCREW LOCKING 5.0X34MM (Screw) ×2 IMPLANT
STAPLER VISISTAT 35W (STAPLE) ×2 IMPLANT
SUT VIC AB 0 CT1 27 (SUTURE) ×3
SUT VIC AB 0 CT1 27XBRD ANBCTR (SUTURE) IMPLANT
SUT VIC AB 2-0 CTB1 (SUTURE) IMPLANT
WATER STERILE IRR 1000ML POUR (IV SOLUTION) ×6 IMPLANT

## 2014-01-02 NOTE — Consult Note (Signed)
Reason for Consult: Left subtrochanteric hip fracture Referring Physician: [facility]  Kristy Jarvis is an 80 y.o. female.  HPI: Patient is a 80 year old woman alert oriented who had a mechanical fall. Patient denies any head trauma.  Past Medical History  Diagnosis Date  . Intermittent vertigo   . Renal colic   . Osteoporosis   . Hyperlipidemia   . GERD (gastroesophageal reflux disease)   . Diverticulosis of colon     by CT  . PONV (postoperative nausea and vomiting)   . Cataract 2013    right, no surgery at this time    Past Surgical History  Procedure Laterality Date  . Cataract extraction  2002    left eye   . Cholecystectomy  1989  . Abdominal hysterectomy    . Esophagogastroduodenoscopy  Jan  2007    prominent schatzki's ring, s/p 3F, small to moderate size hh  . Bunionectomy  06/27/2011    left , Dr Berline Lopes  . Eye surgery  2005 approx    cataract left    Family History  Problem Relation Age of Onset  . Heart attack Father   . Diabetes Sister   . Liver cancer      family history   . Colon cancer Neg Hx   . Anesthesia problems Neg Hx   . Hypotension Neg Hx   . Malignant hyperthermia Neg Hx   . Pseudochol deficiency Neg Hx     Social History:  reports that she has never smoked. She does not have any smokeless tobacco history on file. She reports that she does not drink alcohol or use illicit drugs.  Allergies:  Allergies  Allergen Reactions  . Alendronate Sodium Other (See Comments)    Chest Pain, Fatigue   . Penicillins Rash    Medications: I have reviewed the patient's current medications.  Results for orders placed during the hospital encounter of 01/02/14 (from the past 48 hour(s))  CBC WITH DIFFERENTIAL     Status: None   Collection Time    01/02/14 12:40 PM      Result Value Ref Range   WBC 9.3  4.0 - 10.5 K/uL   RBC 4.63  3.87 - 5.11 MIL/uL   Hemoglobin 14.0  12.0 - 15.0 g/dL   HCT 41.9  36.0 - 46.0 %   MCV 90.5  78.0 -  100.0 fL   MCH 30.2  26.0 - 34.0 pg   MCHC 33.4  30.0 - 36.0 g/dL   RDW 13.8  11.5 - 15.5 %   Platelets 220  150 - 400 K/uL   Neutrophils Relative % 67  43 - 77 %   Neutro Abs 6.3  1.7 - 7.7 K/uL   Lymphocytes Relative 22  12 - 46 %   Lymphs Abs 2.1  0.7 - 4.0 K/uL   Monocytes Relative 7  3 - 12 %   Monocytes Absolute 0.6  0.1 - 1.0 K/uL   Eosinophils Relative 3  0 - 5 %   Eosinophils Absolute 0.3  0.0 - 0.7 K/uL   Basophils Relative 1  0 - 1 %   Basophils Absolute 0.1  0.0 - 0.1 K/uL  BASIC METABOLIC PANEL     Status: Abnormal   Collection Time    01/02/14 12:40 PM      Result Value Ref Range   Sodium 144  137 - 147 mEq/L   Potassium 4.1  3.7 - 5.3 mEq/L   Chloride 104  96 - 112 mEq/L   CO2 26  19 - 32 mEq/L   Glucose, Bld 126 (*) 70 - 99 mg/dL   BUN 19  6 - 23 mg/dL   Creatinine, Ser 1.05  0.50 - 1.10 mg/dL   Calcium 9.5  8.4 - 10.5 mg/dL   GFR calc non Af Amer 45 (*) >90 mL/min   GFR calc Af Amer 53 (*) >90 mL/min   Comment: (NOTE)     The eGFR has been calculated using the CKD EPI equation.     This calculation has not been validated in all clinical situations.     eGFR's persistently <90 mL/min signify possible Chronic Kidney     Disease.   Anion gap 14  5 - 15    Dg Chest 1 View  01/02/2014   CLINICAL DATA:  Fall.  Left hip fracture.  Preop respiratory exam.  EXAM: CHEST - 1 VIEW  COMPARISON:  08/06/2007  FINDINGS: Mild cardiomegaly stable. Large hiatal hernia again seen. Both lungs are clear.  IMPRESSION: No active lung disease.  Large hiatal hernia.  Stable cardiomegaly.   Electronically Signed   By: Earle Gell M.D.   On: 01/02/2014 12:42   Dg Hip Complete Left  01/02/2014   CLINICAL DATA:  Fall.  Left hip pain and deformity.  EXAM: LEFT HIP - COMPLETE 2+ VIEW  COMPARISON:  None.  FINDINGS: An intertrochanteric left hip fracture is seen with medial displacement and varus angulation. No evidence of dislocation. No pelvic fracture identified.  IMPRESSION:  Intertrochanteric left hip fracture.   Electronically Signed   By: Earle Gell M.D.   On: 01/02/2014 12:41    Review of Systems  All other systems reviewed and are negative.  Blood pressure 114/92, pulse 68, resp. rate 18, SpO2 100.00%. Physical Exam On examination patient's left lower extremity was shortened and externally rotated she has pain with range of motion of the hip. Radiograph shows a subtrochanteric fracture of the left. Assessment/Plan: Assessment left subtrochanteric hip fracture.  Plan: Will plan for intramedullary nail fixation. Risks and benefits are discussed including infection neurovascular injury pain need for additional surgery. Patient states she understands was to proceed at this time.  Wenonah Milo V 01/02/2014, 2:48 PM

## 2014-01-02 NOTE — Anesthesia Postprocedure Evaluation (Signed)
  Anesthesia Post-op Note  Patient: Kristy Jarvis  Procedure(s) Performed: Procedure(s): INTRAMEDULLARY (IM) NAIL INTERTROCHANTRIC (Left)  Patient Location: PACU  Anesthesia Type:General  Level of Consciousness: awake and alert   Airway and Oxygen Therapy: Patient Spontanous Breathing  Post-op Pain: none  Post-op Assessment: Post-op Vital signs reviewed, Patient's Cardiovascular Status Stable and Respiratory Function Stable  Post-op Vital Signs: Reviewed  Filed Vitals:   01/02/14 1930  BP: 168/76  Pulse: 72  Temp: 36.4 C  Resp: 10    Complications: No apparent anesthesia complications

## 2014-01-02 NOTE — Op Note (Signed)
01/02/2014  6:02 PM  PATIENT:  [name]    PRE-OPERATIVE DIAGNOSIS:  left hip fracture  POST-OPERATIVE DIAGNOSIS:  Same  PROCEDURE:  INTRAMEDULLARY (IM) NAIL INTERTROCHANTRIC  SURGEON:  Newt Minion, MD  PHYSICIAN ASSISTANT:None ANESTHESIA:   General  PREOPERATIVE INDICATIONS:  CAITLAN COFRANCESCO is a  80 y.o. female with a diagnosis of left hip fracture who failed conservative measures and elected for surgical management.    The risks benefits and alternatives were discussed with the patient preoperatively including but not limited to the risks of infection, bleeding, nerve injury, cardiopulmonary complications, the need for revision surgery, among others, and the patient was willing to proceed.  OPERATIVE IMPLANTS: Synthes trochanteric nail. 170 mm x 10 mm nail. 90 mm barrel. 34 mm interlocking screw.  OPERATIVE FINDINGS: Subtrochanteric fracture  OPERATIVE PROCEDURE: Patient was brought to the operating room and underwent a general anesthetic. After adequate levels of anesthesia were obtained patient was placed in boot traction the left and dorsal lithotomy position and the right. The fracture was reduced C-arm fluoroscopy was used to reduce the fracture. The left lower extremity was prepped using DuraPrep draped into a sterile field with a shower curtain. A timeout was called. A incision was made just proximal to the greater trochanter. In guidewire was placed in the greater trochanter down the shaft. This was then overdrilled and the rod was inserted. This was locked proximally with a 90 mm spiral blade C-arm fluoroscopy verified alignment in both AP and lateral planes. Locked distally with a 34 mm screw. Proximal screw was locked in place. The wounds were irrigated with normal saline. Subcutaneous is closed using 0 Vicryl skin was closed using staples. Mepilex dressing was applied. Final radiographs showed stable alignment. Patient was extubated taken to the PACU in stable  condition.

## 2014-01-02 NOTE — ED Notes (Signed)
Report given to [facility]. nad

## 2014-01-02 NOTE — ED Provider Notes (Signed)
CSN: [id number]     Arrival date & time 01/02/14  1138 History  This chart was scribed for Nat Christen, MD by Erling Conte, ED Scribe. This patient was seen in room APA03/APA03 and the patient's care was started at 2:02 PM.     Chief Complaint  Patient presents with  . Fall      The history is provided by the patient and the EMS personnel. No language interpreter was used.   HPI Comments: Kristy Jarvis is a 80 y.o. female brought in by ambulance, who presents to the Emergency Department due a fall that happened about an hour ago. Pt states she was walking outside and tripped and fell and landed on her left side. She notes that she is having associated left hip pain and is unable to move it. The pain is exacerbated by movement. She denies any head injury or LOC. Pt states she lives at home and is very active. She has not taken anything for the pain. Pt denies any other pain or any other injuries. She denies any HA, dizziness, numbness, weakness, lightheadedness or neck pain.    Past Medical History  Diagnosis Date  . Intermittent vertigo   . Renal colic   . Osteoporosis   . Hyperlipidemia   . GERD (gastroesophageal reflux disease)   . Diverticulosis of colon     by CT  . PONV (postoperative nausea and vomiting)   . Cataract 2013    right, no surgery at this time   Past Surgical History  Procedure Laterality Date  . Cataract extraction  2002    left eye   . Cholecystectomy  1989  . Abdominal hysterectomy    . Esophagogastroduodenoscopy  Jan  2007    prominent schatzki's ring, s/p 2F, small to moderate size hh  . Bunionectomy  06/27/2011    left , Dr Berline Lopes  . Eye surgery  2005 approx    cataract left   Family History  Problem Relation Age of Onset  . Heart attack Father   . Diabetes Sister   . Liver cancer      family history   . Colon cancer Neg Hx   . Anesthesia problems Neg Hx   . Hypotension Neg Hx   . Malignant hyperthermia Neg Hx   . Pseudochol  deficiency Neg Hx    History  Substance Use Topics  . Smoking status: Never Smoker   . Smokeless tobacco: Not on file  . Alcohol Use: No   OB History   Grav Para Term Preterm Abortions TAB SAB Ect Mult Living                 Review of Systems  Musculoskeletal: Positive for arthralgias (left hip). Negative for neck pain.  Neurological: Negative for dizziness, syncope, weakness, light-headedness and headaches.  A complete 10 system review of systems was obtained and all systems are negative except as noted in the HPI and PMH.      Allergies  Alendronate sodium and Penicillins  Home Medications   Prior to Admission medications   Medication Sig Start Date End Date Taking? Authorizing Provider  aspirin (ASPIRIN LOW DOSE) 81 MG EC tablet Take 81 mg by mouth daily.    Yes Historical Provider, MD  calcium carbonate (TUMS EX) 750 MG chewable tablet Chew 1 tablet by mouth daily.   Yes Historical Provider, MD  Cholecalciferol (VITAMIN D) 400 UNITS capsule Take 800 Units by mouth daily.    Yes Historical Provider,  MD  lovastatin (MEVACOR) 40 MG tablet Take 40 mg by mouth at bedtime.   Yes Historical Provider, MD  omeprazole (PRILOSEC) 20 MG capsule Take 20 mg by mouth daily.   Yes Historical Provider, MD   BP 119/87  Pulse 60  Resp 16  SpO2 100% Physical Exam  Nursing note and vitals reviewed. Constitutional: She is oriented to person, place, and time. She appears well-developed and well-nourished.  HENT:  Head: Normocephalic and atraumatic.  Eyes: Conjunctivae and EOM are normal. Pupils are equal, round, and reactive to light.  Neck: Normal range of motion. Neck supple.  Cardiovascular: Normal rate, regular rhythm and normal heart sounds.   Pulmonary/Chest: Effort normal and breath sounds normal.  Abdominal: Soft. Bowel sounds are normal.  Musculoskeletal: She exhibits tenderness.  Tenderness to palpation of left lateral hip  Neurological: She is alert and oriented to person,  place, and time.  Skin: Skin is warm and dry.  Psychiatric: She has a normal mood and affect. Her behavior is normal.    ED Course  Procedures (including critical care time)  DIAGNOSTIC STUDIES: Oxygen Saturation is 100% which is normal by my interpretation    COORDINATION OF CARE: 11:52 AM- Will order IV, morphine injection, CXR, DG Left Hip, and diagnostic lab work.. Pt advised of plan for treatment and pt agrees.   Results for orders placed during the hospital encounter of 01/02/14  CBC WITH DIFFERENTIAL      Result Value Ref Range   WBC 9.3  4.0 - 10.5 K/uL   RBC 4.63  3.87 - 5.11 MIL/uL   Hemoglobin 14.0  12.0 - 15.0 g/dL   HCT 41.9  36.0 - 46.0 %   MCV 90.5  78.0 - 100.0 fL   MCH 30.2  26.0 - 34.0 pg   MCHC 33.4  30.0 - 36.0 g/dL   RDW 13.8  11.5 - 15.5 %   Platelets 220  150 - 400 K/uL   Neutrophils Relative % 67  43 - 77 %   Neutro Abs 6.3  1.7 - 7.7 K/uL   Lymphocytes Relative 22  12 - 46 %   Lymphs Abs 2.1  0.7 - 4.0 K/uL   Monocytes Relative 7  3 - 12 %   Monocytes Absolute 0.6  0.1 - 1.0 K/uL   Eosinophils Relative 3  0 - 5 %   Eosinophils Absolute 0.3  0.0 - 0.7 K/uL   Basophils Relative 1  0 - 1 %   Basophils Absolute 0.1  0.0 - 0.1 K/uL  BASIC METABOLIC PANEL      Result Value Ref Range   Sodium 144  137 - 147 mEq/L   Potassium 4.1  3.7 - 5.3 mEq/L   Chloride 104  96 - 112 mEq/L   CO2 26  19 - 32 mEq/L   Glucose, Bld 126 (*) 70 - 99 mg/dL   BUN 19  6 - 23 mg/dL   Creatinine, Ser 1.05  0.50 - 1.10 mg/dL   Calcium 9.5  8.4 - 10.5 mg/dL   GFR calc non Af Amer 45 (*) >90 mL/min   GFR calc Af Amer 53 (*) >90 mL/min   Anion gap 14  5 - 15     Labs Review Labs Reviewed  BASIC METABOLIC PANEL - Abnormal; Notable for the following:    Glucose, Bld 126 (*)    GFR calc non Af Amer 45 (*)    GFR calc Af Amer 53 (*)  All other components within normal limits  CBC WITH DIFFERENTIAL    Imaging Review Dg Chest 1 View  01/02/2014   CLINICAL DATA:   Fall.  Left hip fracture.  Preop respiratory exam.  EXAM: CHEST - 1 VIEW  COMPARISON:  08/06/2007  FINDINGS: Mild cardiomegaly stable. Large hiatal hernia again seen. Both lungs are clear.  IMPRESSION: No active lung disease.  Large hiatal hernia.  Stable cardiomegaly.   Electronically Signed   By: Earle Gell M.D.   On: 01/02/2014 12:42   Dg Hip Complete Left  01/02/2014   CLINICAL DATA:  Fall.  Left hip pain and deformity.  EXAM: LEFT HIP - COMPLETE 2+ VIEW  COMPARISON:  None.  FINDINGS: An intertrochanteric left hip fracture is seen with medial displacement and varus angulation. No evidence of dislocation. No pelvic fracture identified.  IMPRESSION: Intertrochanteric left hip fracture.   Electronically Signed   By: Earle Gell M.D.   On: 01/02/2014 12:41     EKG Interpretation None      Date: 01/02/2014  Rate: 64  Rhythm: normal sinus rhythm  QRS Axis: normal  Intervals: normal  ST/T Wave abnormalities: normal  Conduction Disutrbances: none  Narrative Interpretation: unremarkable    MDM   Final diagnoses:  Intertrochanteric fracture of left femur, closed, initial encounter   Plain films of left hip reveal a intertrochanteric fracture. Patient is lucid. Discussed with Dr. Sharol Given.  Transfer to [employer].  Also discussed with hospitalist Dr. Roderic Palau  I personally performed the services described in this documentation, which was scribed in my presence. The recorded information has been reviewed and is accurate.     Nat Christen, MD 01/02/14 [phone]

## 2014-01-02 NOTE — H&P (Signed)
[facility] History and Physical  Kristy Jarvis [id number] DOB: November 19, 1920 DOA: 01/02/2014  Referring physician: Dr. Lacinda Axon PCP: Tula Nakayama, MD   Chief Complaint: fall  HPI: Kristy Jarvis is a 80 y.o. female with no significant medical problems, who was in her usual state of health when this morning she was walking and lost her footing due to a dip in the ground. Patient lost her balance resulting in a fall. She complains of left hip pain and is unable to stand. She denied any lightheadedness, dizziness, chest pain or shortness of breath prior to falling. He has not had any recent fevers, dysuria, nausea, vomiting, diarrhea or any other complaints. She was evaluated in the emergency room where x-ray of her left hip indicated a intertrochanteric left hip fracture. Unfortunately, we did have orthopedic services available today at [facility]. Case was discussed by Dr. Lacinda Axon with Dr. Sharol Given, orthopedics in [facility]. Plan is to transfer the patient down to [facility] for operative management.   Review of Systems:  Pertinent positives as per HPI, otherwise negative  Past Medical History  Diagnosis Date  . Intermittent vertigo   . Renal colic   . Osteoporosis   . Hyperlipidemia   . GERD (gastroesophageal reflux disease)   . Diverticulosis of colon     by CT  . PONV (postoperative nausea and vomiting)   . Cataract 2013    right, no surgery at this time   Past Surgical History  Procedure Laterality Date  . Cataract extraction  2002    left eye   . Cholecystectomy  1989  . Abdominal hysterectomy    . Esophagogastroduodenoscopy  Jan  2007    prominent schatzki's ring, s/p 64F, small to moderate size hh  . Bunionectomy  06/27/2011    left , Dr Berline Lopes  . Eye surgery  2005 approx    cataract left   Social History:  reports that she has never smoked. She does not have any smokeless tobacco history on file. She reports that she does not drink alcohol  or use illicit drugs.  Allergies  Allergen Reactions  . Alendronate Sodium Other (See Comments)    Chest Pain, Fatigue   . Penicillins Rash    Family History  Problem Relation Age of Onset  . Heart attack Father   . Diabetes Sister   . Liver cancer      family history   . Colon cancer Neg Hx   . Anesthesia problems Neg Hx   . Hypotension Neg Hx   . Malignant hyperthermia Neg Hx   . Pseudochol deficiency Neg Hx      Prior to Admission medications   Medication Sig Start Date End Date Taking? Authorizing Provider  aspirin (ASPIRIN LOW DOSE) 81 MG EC tablet Take 81 mg by mouth daily.    Yes Historical Provider, MD  calcium carbonate (TUMS EX) 750 MG chewable tablet Chew 1 tablet by mouth daily.   Yes Historical Provider, MD  Cholecalciferol (VITAMIN D) 400 UNITS capsule Take 800 Units by mouth daily.    Yes Historical Provider, MD  lovastatin (MEVACOR) 40 MG tablet Take 40 mg by mouth at bedtime.   Yes Historical Provider, MD  omeprazole (PRILOSEC) 20 MG capsule Take 20 mg by mouth daily.   Yes Historical Provider, MD   Physical Exam: Filed Vitals:   01/02/14 1209 01/02/14 1230 01/02/14 1330 01/02/14 1400  BP: 140/99 135/89 119/87 114/92  Pulse: 60   68  Resp: 22 21 16 18   SpO2: 100%   100%    Wt Readings from Last 3 Encounters:  12/18/13 46.176 kg (101 lb 12.8 oz)  07/30/13 48.444 kg (106 lb 12.8 oz)  03/25/13 49.5 kg (109 lb 2 oz)    General:  Appears calm and comfortable Eyes: pupils are pinpoint, normal lids, irises & conjunctiva ENT: grossly normal hearing, lips & tongue Neck: no LAD, masses or thyromegaly Cardiovascular: RRR, no m/r/g. No LE edema. Telemetry: SR, no arrhythmias  Respiratory: CTA bilaterally, no w/r/r. Normal respiratory effort. Abdomen: soft, ntnd Skin: no rash or induration seen on limited exam Musculoskeletal: left leg is shortened, tender on palpation over left hip Psychiatric: grossly normal mood and affect, speech fluent and  appropriate Neurologic: grossly intact, nonfocal, could not assess LLE due to pain          Labs on Admission:  Basic Metabolic Panel:  Recent Labs Lab 01/02/14 1240  NA 144  K 4.1  CL 104  CO2 26  GLUCOSE 126*  BUN 19  CREATININE 1.05  CALCIUM 9.5   Liver Function Tests: No results found for this basename: AST, ALT, ALKPHOS, BILITOT, PROT, ALBUMIN,  in the last 168 hours No results found for this basename: LIPASE, AMYLASE,  in the last 168 hours No results found for this basename: AMMONIA,  in the last 168 hours CBC:  Recent Labs Lab 01/02/14 1240  WBC 9.3  NEUTROABS 6.3  HGB 14.0  HCT 41.9  MCV 90.5  PLT 220   Cardiac Enzymes: No results found for this basename: CKTOTAL, CKMB, CKMBINDEX, TROPONINI,  in the last 168 hours  BNP (last 3 results) No results found for this basename: PROBNP,  in the last 8760 hours CBG: No results found for this basename: GLUCAP,  in the last 168 hours  Radiological Exams on Admission: Dg Chest 1 View  01/02/2014   CLINICAL DATA:  Fall.  Left hip fracture.  Preop respiratory exam.  EXAM: CHEST - 1 VIEW  COMPARISON:  08/06/2007  FINDINGS: Mild cardiomegaly stable. Large hiatal hernia again seen. Both lungs are clear.  IMPRESSION: No active lung disease.  Large hiatal hernia.  Stable cardiomegaly.   Electronically Signed   By: Earle Gell M.D.   On: 01/02/2014 12:42   Dg Hip Complete Left  01/02/2014   CLINICAL DATA:  Fall.  Left hip pain and deformity.  EXAM: LEFT HIP - COMPLETE 2+ VIEW  COMPARISON:  None.  FINDINGS: An intertrochanteric left hip fracture is seen with medial displacement and varus angulation. No evidence of dislocation. No pelvic fracture identified.  IMPRESSION: Intertrochanteric left hip fracture.   Electronically Signed   By: Earle Gell M.D.   On: 01/02/2014 12:41    EKG: Independently reviewed. No acute ST-T changes  Assessment/Plan Active Problems:   Hip fracture   1. Left hip fracture. Case was discussed  by the ER physician with Dr. Sharol Given, orthopedics at [facility]. Plan is to transfer the patient to [facility] for operative management. Dr. Sharol Given wishes the patient to be transported directly to the operating room. We will keep her n.p.o in anticipation for surgery. She will need physical therapy once her surgery is completed. Continue pain management as per hip fracture protocol. DVT prophylaxis with Lovenox. I have discussed the case with Dr. Sheran Fava with the hospitalist service at [facility] who has accepted the patient in transfer 2. GERD. Continue proton pump inhibitors.  3. Hyperlipidemia. Continue statin 4. Osteoporosis. Continue  calcium and vitamin D 5. Preoperative evaluation. Patient does not have any known coronary artery disease. Per Lyndel Safe perioperative cardiac risk index, her estimated risk for perioperative myocardial infarction or cardiac arrest is 0.26% and she should proceed with this necessary surgery.  Orthopedics, Dr. Sharol Given  Code Status: full code DVT Prophylaxis: lovenox Family Communication: discussed with daughter at the bedside Disposition Plan: may need SNF placement, based on progression  Time spent: 2mins  MEMON,JEHANZEB [facility] Pager [phone]  **Disclaimer: This note may have been dictated with voice recognition software. Similar sounding words can inadvertently be transcribed and this note may contain transcription errors which may not have been corrected upon publication of note.**

## 2014-01-02 NOTE — Transfer of Care (Signed)
Immediate Anesthesia Transfer of Care Note  Patient: Kristy Jarvis  Procedure(s) Performed: Procedure(s): INTRAMEDULLARY (IM) NAIL INTERTROCHANTRIC (Left)  Patient Location: PACU  Anesthesia Type:General  Level of Consciousness: awake and alert   Airway & Oxygen Therapy: Patient Spontanous Breathing and Patient connected to nasal cannula oxygen  Post-op Assessment: Report given to PACU RN, Post -op Vital signs reviewed and stable and Patient moving all extremities X 4  Post vital signs: Reviewed and stable  Complications: No apparent anesthesia complications

## 2014-01-02 NOTE — Anesthesia Procedure Notes (Signed)
Procedure Name: Intubation Date/Time: 01/02/2014 5:20 PM Performed by: Raphael Gibney T Pre-anesthesia Checklist: Patient identified, Timeout performed, Emergency Drugs available, Suction available and Patient being monitored Patient Re-evaluated:Patient Re-evaluated prior to inductionOxygen Delivery Method: Circle system utilized and Simple face mask Preoxygenation: Pre-oxygenation with 100% oxygen Intubation Type: IV induction Ventilation: Mask ventilation without difficulty Laryngoscope Size: Miller and 2 Grade View: Grade I Tube type: Oral Tube size: 7.0 mm Number of attempts: 1 Airway Equipment and Method: Patient positioned with wedge pillow and Stylet Placement Confirmation: ETT inserted through vocal cords under direct vision,  positive ETCO2 and breath sounds checked- equal and bilateral Secured at: 21 cm Tube secured with: Tape Dental Injury: Teeth and Oropharynx as per pre-operative assessment

## 2014-01-02 NOTE — Anesthesia Preprocedure Evaluation (Addendum)
Anesthesia Evaluation  Patient identified by MRN, date of birth, ID band Patient awake    Reviewed: Allergy & Precautions, H&P , NPO status   History of Anesthesia Complications (+) PONV  Airway Mallampati: II TM Distance: >3 FB Neck ROM: Full    Dental  (+) Edentulous Upper, Partial Lower, Dental Advisory Given   Pulmonary neg pulmonary ROS,  breath sounds clear to auscultation  Pulmonary exam normal       Cardiovascular negative cardio ROS  Rhythm:Regular Rate:Normal     Neuro/Psych negative neurological ROS  negative psych ROS   GI/Hepatic Neg liver ROS, GERD-  ,  Endo/Other  negative endocrine ROS  Renal/GU negative Renal ROS     Musculoskeletal   Abdominal   Peds  Hematology negative hematology ROS (+)   Anesthesia Other Findings   Reproductive/Obstetrics                          Anesthesia Physical Anesthesia Plan  ASA: III  Anesthesia Plan: General   Post-op Pain Management:    Induction: Intravenous  Airway Management Planned: LMA  Additional Equipment:   Intra-op Plan:   Post-operative Plan: Extubation in OR  Informed Consent: I have reviewed the patients History and Physical, chart, labs and discussed the procedure including the risks, benefits and alternatives for the proposed anesthesia with the patient or authorized representative who has indicated his/her understanding and acceptance.   Dental advisory given  Plan Discussed with: CRNA, Anesthesiologist and Surgeon  Anesthesia Plan Comments:        Anesthesia Quick Evaluation

## 2014-01-02 NOTE — ED Notes (Signed)
Pt tripped and fell. Landing on left side. Denies hitting head [place]. Pt arrived alert/oriented. Left leg shortened and rotated in. Pt slightly restless but calm. Pedal pulses present and equal. Comfort measures given. Towel under left knee for comfort. Pt lying flat. Came in boarded but no c collar.

## 2014-01-03 DIAGNOSIS — D62 Acute posthemorrhagic anemia: Secondary | ICD-10-CM

## 2014-01-03 DIAGNOSIS — R110 Nausea: Secondary | ICD-10-CM

## 2014-01-03 LAB — CREATININE, SERUM
Creatinine, Ser: 0.85 mg/dL (ref 0.50–1.10)
GFR calc Af Amer: 68 mL/min — ABNORMAL LOW (ref >90)
GFR calc non Af Amer: 59 mL/min — ABNORMAL LOW (ref >90)

## 2014-01-03 LAB — CBC
HEMATOCRIT: 30.2 % — AB (ref 36.0–46.0)
HEMATOCRIT: 34.3 % — AB (ref 36.0–46.0)
Hemoglobin: 11.2 g/dL — ABNORMAL LOW (ref 12.0–15.0)
Hemoglobin: 9.9 g/dL — ABNORMAL LOW (ref 12.0–15.0)
MCH: 29.6 pg (ref 26.0–34.0)
MCH: 29.6 pg (ref 26.0–34.0)
MCHC: 32.7 g/dL (ref 30.0–36.0)
MCHC: 32.8 g/dL (ref 30.0–36.0)
MCV: 90.4 fL (ref 78.0–100.0)
MCV: 90.7 fL (ref 78.0–100.0)
PLATELETS: 194 10*3/uL (ref 150–400)
Platelets: 197 10*3/uL (ref 150–400)
RBC: 3.34 MIL/uL — ABNORMAL LOW (ref 3.87–5.11)
RBC: 3.78 MIL/uL — ABNORMAL LOW (ref 3.87–5.11)
RDW: 13.9 % (ref 11.5–15.5)
RDW: 14.1 % (ref 11.5–15.5)
WBC: 10.9 10*3/uL — ABNORMAL HIGH (ref 4.0–10.5)
WBC: 9.7 10*3/uL (ref 4.0–10.5)

## 2014-01-03 LAB — BASIC METABOLIC PANEL
Anion gap: 12 (ref 5–15)
BUN: 17 mg/dL (ref 6–23)
CALCIUM: 8.5 mg/dL (ref 8.4–10.5)
CO2: 22 mEq/L (ref 19–32)
CREATININE: 0.87 mg/dL (ref 0.50–1.10)
Chloride: 107 mEq/L (ref 96–112)
GFR calc Af Amer: 66 mL/min — ABNORMAL LOW (ref >90)
GFR, EST NON AFRICAN AMERICAN: 57 mL/min — AB (ref >90)
Glucose, Bld: 152 mg/dL — ABNORMAL HIGH (ref 70–99)
Potassium: 4.9 mEq/L (ref 3.7–5.3)
Sodium: 141 mEq/L (ref 137–147)

## 2014-01-03 LAB — ABO/RH: ABO/RH(D): O POS

## 2014-01-03 MED ORDER — SENNA 8.6 MG PO TABS
2.0000 | ORAL_TABLET | Freq: Every day | ORAL | Status: DC
  Administered 2014-01-03 – 2014-01-04 (×2): 17.2 mg via ORAL
  Filled 2014-01-03 (×3): qty 2

## 2014-01-03 MED ORDER — ENOXAPARIN SODIUM 30 MG/0.3ML ~~LOC~~ SOLN
30.0000 mg | SUBCUTANEOUS | Status: DC
  Administered 2014-01-03 – 2014-01-04 (×2): 30 mg via SUBCUTANEOUS
  Filled 2014-01-03 (×3): qty 0.3

## 2014-01-03 MED ORDER — ENSURE COMPLETE PO LIQD: 237.0000 mL | Freq: Two times a day (BID) | ORAL | Status: DC

## 2014-01-03 MED ORDER — POLYETHYLENE GLYCOL 3350 17 G PO PACK
17.0000 g | PACK | Freq: Every day | ORAL | Status: DC
  Administered 2014-01-04 – 2014-01-05 (×2): 17 g via ORAL
  Filled 2014-01-03 (×3): qty 1

## 2014-01-03 MED ORDER — BISACODYL 10 MG RE SUPP: 10.0000 mg | Freq: Every day | RECTAL | Status: DC | PRN

## 2014-01-03 NOTE — Evaluation (Signed)
Occupational Therapy Evaluation Patient Details Name: Kristy Jarvis MRN: [id number] DOB: [birth year] Today's Date: 01/03/2014    History of Present Illness pt presents after fall resulting in L hip fx, now post IM nail.     Clinical Impression   This 80 yo female admitted and underwent above presents to acute OT with decreased balance, decreased mobility, increased pain, decreased AROM LLE all affecting the patient's ability to care for herself. She will benefit from continued OT at SNF to get to an Independent/Mod I level.    Follow Up Recommendations  SNF    Equipment Recommendations   (TBD at next venue)       Precautions / Restrictions Precautions Precautions: Fall Restrictions Weight Bearing Restrictions: No LLE Weight Bearing: Weight bearing as tolerated      Mobility Bed Mobility Overal bed mobility: Needs Assistance;+2 for physical assistance Bed Mobility: Sit to Supine      Sit to supine: Min assist;+2 for physical assistance    Transfers Overall transfer level: Needs assistance Equipment used: Rolling walker (2 wheeled) Transfers: Sit to/from Stand Sit to Stand: Min assist;+2 physical assistance         General transfer comment: VCs for safe hand placement    Balance Overall balance assessment: Needs assistance Sitting-balance support: Feet supported;Bilateral upper extremity supported Sitting balance-Leahy Scale: Poor     Standing balance support: Bilateral upper extremity supported Standing balance-Leahy Scale: Poor                              ADL Overall ADL's : Needs assistance/impaired Eating/Feeding: Independent;Sitting   Grooming: Set up;Sitting   Upper Body Bathing: Set up;Sitting   Lower Body Bathing: Maximal assistance;Sit to/from stand   Upper Body Dressing : Set up;Sitting   Lower Body Dressing: Total assistance;Sit to/from stand   Toilet Transfer: Moderate assistance;+2 for physical  assistance;Stand-pivot;Ambulation;RW;BSC   Toileting- Clothing Manipulation and Hygiene: Total assistance;Sit to/from stand                         Pertinent Vitals/Pain Pain Assessment: 0-10 Pain Score: 4  Pain Location: left hip Pain Descriptors / Indicators: Shooting (with transitional movements) Pain Intervention(s): Repositioned     Hand Dominance Right   Extremity/Trunk Assessment Upper Extremity Assessment Upper Extremity Assessment: Overall WFL for tasks assessed     Communication Communication Communication: No difficulties   Cognition Arousal/Alertness: Awake/alert Behavior During Therapy: WFL for tasks assessed/performed Overall Cognitive Status: Within Functional Limits for tasks assessed                                Home Living Family/patient expects to be discharged to:: Skilled nursing facility                                        Prior Functioning/Environment Level of Independence: Needs assistance    ADL's / Homemaking Assistance Needed: Family only A with outside chores and heavy lifting.     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, very active    OT Diagnosis: Generalized weakness;Acute pain   OT Problem List: Decreased strength;Decreased activity tolerance;Impaired balance (sitting and/or standing);Pain      OT Goals(Current goals can be found in  the care plan section) Acute Rehab OT Goals Patient Stated Goal: to rehab then home and independent again  OT Frequency:                End of Session Equipment Utilized During Treatment: Gait belt;Rolling walker  Activity Tolerance: Patient limited by pain Patient left: in bed;with call bell/phone within reach;with bed alarm set   Time: 1225-1237 OT Time Calculation (min): 12 min Charges:  OT General Charges $OT Visit: 1 Procedure OT Evaluation $Initial OT Evaluation Tier I: 1 Procedure OT  Treatments $Self Care/Home Management : 8-22 mins  Almon Register [id number] 01/03/2014, 1:58 PM

## 2014-01-03 NOTE — Progress Notes (Signed)
Patient ID: Kristy Jarvis, female   DOB: 10/26/1920, 80 y.o.   MRN: [id number] Postoperative day 1 intramedullary nail fixation for subtrochanteric left hip fracture. Patient is alert and oriented this morning no complaints. Will plan for physical therapy progressive ambulation weightbearing as tolerated with discharge to skilled nursing.

## 2014-01-03 NOTE — Progress Notes (Signed)
[facility] PROGRESS NOTE  Kristy Jarvis [id number] DOB: 1920-10-13 DOA: 01/02/2014 PCP: Tula Nakayama, MD  Assessment/Plan  Left hip fracture s/p ORIF with IM nailing on 8/14 by Dr. Sharol Given -  PT/OT -  Weight bearing, dressings, VTE prophylaxis, and follow up per orthopedics  Left knee effusion likely due to trauma from fall, no hx of significant arthritis and was not enlarged prior to fall -  Consider arthrocentesis for comfort, defer to orthopedics  GERD, stable, continue proton pump inhibitor (this increases risk of OP)  Hyperlipidemia, stable, continue statin  Osteoporosis. Continue calcium and vitamin D  Borderline leukocytosis, likely related to surgery last night -  Monitor for signs of infection  Acute blood loss anemia due to surgery, hemoglobin trending down -  Repeat CBC in AM  Diet:  CLD per patient request due to nausea, but may advance to regular diet ASAP Access:  PIV IVF:  off Proph:  lovenox + ASA   Code Status: full Family Communication: patient, daughter, and granddaughter Disposition Plan:  pending PT/OT, likely Monday   Consultants:  Ortho, Dr. Sharol Given  Procedures:  ORIF/IM left hip 8/14  Antibiotics:  none   HPI/Subjective:  Pain in left hip when moving, but otherwise minimal pain.  Foley removed this AM and has not tried to void yet.  Last BM was two days ago.  Nausea and lightheadedness this morning and she is asking for liquids for lunch.    Objective: Filed Vitals:   01/03/14 0100 01/03/14 0125 01/03/14 0400 01/03/14 0517  BP:  121/55  119/48  Pulse:  69  78  Temp:  97.6 F (36.4 C)  98 F (36.7 C)  TempSrc:  Oral  Oral  Resp:   14   Height: 5\' 1"  (1.549 m)     Weight: 45.813 kg (101 lb)     SpO2:  100% 95% 98%    Intake/Output Summary (Last 24 hours) at 01/03/14 0940 Last data filed at 01/03/14 0800  Gross per 24 hour  Intake   1260 ml  Output    800 ml  Net    460 ml   Filed Weights   01/03/14 0100   Weight: 45.813 kg (101 lb)    Exam:   General:  WF, No acute distress  HEENT:  NCAT, MMM  Cardiovascular:  RRR, nl S1, S2 no mrg, 2+ pulses, warm extremities  Respiratory:  CTAB, no increased WOB  Abdomen:   NABS, soft, NT/ND  MSK:   Normal tone and bulk, left knee effusion, left hip dressing with some blood stains, no ecchymoses, induration, swelling, fluctuance  Neuro:  Grossly intact  Data Reviewed: Basic Metabolic Panel:  Recent Labs Lab 01/02/14 1240 01/02/14 2342 01/03/14 0525  NA 144  --  141  K 4.1  --  4.9  CL 104  --  107  CO2 26  --  22  GLUCOSE 126*  --  152*  BUN 19  --  17  CREATININE 1.05 0.85 0.87  CALCIUM 9.5  --  8.5   Liver Function Tests: No results found for this basename: AST, ALT, ALKPHOS, BILITOT, PROT, ALBUMIN,  in the last 168 hours No results found for this basename: LIPASE, AMYLASE,  in the last 168 hours No results found for this basename: AMMONIA,  in the last 168 hours CBC:  Recent Labs Lab 01/02/14 1240 01/02/14 2342 01/03/14 0525  WBC 9.3 9.7 10.9*  NEUTROABS 6.3  --   --   HGB 14.0  11.2* 9.9*  HCT 41.9 34.3* 30.2*  MCV 90.5 90.7 90.4  PLT 220 194 197   Cardiac Enzymes: No results found for this basename: CKTOTAL, CKMB, CKMBINDEX, TROPONINI,  in the last 168 hours BNP (last 3 results) No results found for this basename: PROBNP,  in the last 8760 hours CBG: No results found for this basename: GLUCAP,  in the last 168 hours  No results found for this or any previous visit (from the past 240 hour(s)).   Studies: Dg Chest 1 View  01/02/2014   CLINICAL DATA:  Fall.  Left hip fracture.  Preop respiratory exam.  EXAM: CHEST - 1 VIEW  COMPARISON:  08/06/2007  FINDINGS: Mild cardiomegaly stable. Large hiatal hernia again seen. Both lungs are clear.  IMPRESSION: No active lung disease.  Large hiatal hernia.  Stable cardiomegaly.   Electronically Signed   By: Earle Gell M.D.   On: 01/02/2014 12:42   Dg Hip Complete  Left  01/02/2014   CLINICAL DATA:  Fall.  Left hip pain and deformity.  EXAM: LEFT HIP - COMPLETE 2+ VIEW  COMPARISON:  None.  FINDINGS: An intertrochanteric left hip fracture is seen with medial displacement and varus angulation. No evidence of dislocation. No pelvic fracture identified.  IMPRESSION: Intertrochanteric left hip fracture.   Electronically Signed   By: Earle Gell M.D.   On: 01/02/2014 12:41   Dg Hip Operative Left  01/02/2014   CLINICAL DATA:  ORIF of a fracture of the proximal left humerus.  EXAM: OPERATIVE LEFT HIP  COMPARISON:  01/02/2014 at 12:12 p.m.  FINDINGS: Single portable image shows a [name] intra medullary rod supporting 810, reducing the major fracture fragments into near anatomic alignment. There is no evidence of an operative complication. Orthopedic hardware is well-seated.  IMPRESSION: ORIF of a proximal left femur fracture as described.   Electronically Signed   By: Lajean Manes M.D.   On: 01/02/2014 18:28    Scheduled Meds: . aspirin EC  325 mg Oral Q breakfast  . calcium carbonate  1.5 tablet Oral Daily  . cholecalciferol  800 Units Oral Daily  . docusate sodium  100 mg Oral BID  . enoxaparin (LOVENOX) injection  40 mg Subcutaneous Q24H  . pantoprazole  40 mg Oral Daily  . simvastatin  20 mg Oral q1800   Continuous Infusions: . sodium chloride 10 mL/hr at 01/03/14 0840    Principal Problem:   Hip fracture Active Problems:   HYPERLIPIDEMIA   GERD   OSTEOPOROSIS   Hip subtrochanteric fracture    Time spent: 30 min    Dartanyon Frankowski, [facility] Pager [phone]. If 7PM-7AM, please contact night-coverage at [email or web address], password [facility] 01/03/2014, 9:40 AM  LOS: 1 day

## 2014-01-03 NOTE — Clinical Social Work Psychosocial (Addendum)
Clinical Social Work Department BRIEF PSYCHOSOCIAL ASSESSMENT 01/03/2014  Patient:  Kristy Jarvis, Kristy Jarvis     Account Number:  [id number]     Admit date:  01/02/2014  Clinical Social Worker:  Wylene Men  Date/Time:  01/03/2014 01:19 PM  Referred by:  Physician  Date Referred:  01/03/2014 Referred for  SNF Placement   Other Referral:   none   Interview type:  Other - See comment Other interview type:   pt and [name]    PSYCHOSOCIAL DATA Living Status:  ALONE Admitted from facility:   Level of care:   Primary support name:  Rodena Piety Primary support relationship to patient:  CHILD, ADULT Degree of support available:   adequate    CURRENT CONCERNS Current Concerns  Post-Acute Placement   Other Concerns:   none    SOCIAL WORK ASSESSMENT / PLAN PT is recommending STR/SNF upon dc.  Pt and daughter is agreeable with this plan.  Pt is post surgery.  Prior to hospital admission, pt was independent and living at home alone with no assistance from family/friends.  Pt still operates a motor vehicle and is proud to report, she still mowes (riding) her grass.  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    Pt and daughter are requesting [facility].  Pt has Blue Medicare and [employer] is in network.  CSW will submit clinicals to insurance and assist with disposition.   Assessment/plan status:  Psychosocial Support/Ongoing Assessment of Needs Other assessment/ plan:   FL2  PASARR  Blue Medicare authorization   Information/referral to community resources:   SNF    PATIENT'S/FAMILY'S RESPONSE TO PLAN OF CARE: Pt and daughter are agreeable to SNF disposition and requesting [facility].  Both were appreciative of CSW assistance and support.       Nonnie Done, [place] [phone] (weekend coverage) Clinical Social Work

## 2014-01-03 NOTE — Evaluation (Signed)
Physical Therapy Evaluation Patient Details Name: Kristy Jarvis MRN: [id number] DOB: May 23, 1920 Today's Date: 01/03/2014   History of Present Illness  pt presents after fall resulting in L hip fx, now post IM nail.    Clinical Impression  Pt very motivated to returning to being home independently.  Discussed pt needing further rehab prior to pt returning to home independently and daughter present states pt and family already have some facilities in mind.  Will continue to follow.      Follow Up Recommendations SNF    Equipment Recommendations  Rolling walker with 5" wheels;3in1 (PT)    Recommendations for Other Services       Precautions / Restrictions Precautions Precautions: Fall Restrictions Weight Bearing Restrictions: Yes LLE Weight Bearing: Weight bearing as tolerated      Mobility  Bed Mobility Overal bed mobility: Needs Assistance;+2 for physical assistance Bed Mobility: Supine to Sit     Supine to sit: Mod assist;+2 for physical assistance     General bed mobility comments: cues for sequencing.  pt needs A with L LE and bringing trunk up to sitting.    Transfers Overall transfer level: Needs assistance Equipment used: Rolling walker (2 wheeled) Transfers: Sit to/from Stand Sit to Stand: Mod assist;+2 physical assistance         General transfer comment: cues for UE use and getting closer to chair prior to sitting.    Ambulation/Gait Ambulation/Gait assistance: Min assist;+2 physical assistance Ambulation Distance (Feet): 3 Feet Assistive device: Rolling walker (2 wheeled) Gait Pattern/deviations: Step-to pattern;Decreased step length - right;Decreased stance time - left;Decreased stride length;Trunk flexed   Gait velocity interpretation: Below normal speed for age/gender General Gait Details: pt indicating feeling nauseated during amb and needed to sit.  Cues and A for use of RW, gait sequencing, and upright posture.    Stairs             Wheelchair Mobility    Modified Rankin (Stroke Patients Only)       Balance Overall balance assessment: Needs assistance Sitting-balance support: Bilateral upper extremity supported;Feet supported Sitting balance-Leahy Scale: Poor     Standing balance support: Bilateral upper extremity supported Standing balance-Leahy Scale: Poor                               Pertinent Vitals/Pain Pain Assessment: Faces Faces Pain Scale: Hurts even more (pt states "I'll be ok.") Pain Location: L hip Pain Intervention(s): Premedicated before session;Repositioned    Home Living Family/patient expects to be discharged to:: Skilled nursing facility                      Prior Function Level of Independence: Needs assistance      ADL's / Homemaking Assistance Needed: Family only A with outside chores and heavy lifting.          Hand Dominance        Extremity/Trunk Assessment   Upper Extremity Assessment: Defer to OT evaluation           Lower Extremity Assessment: LLE deficits/detail   LLE Deficits / Details: Limited by post-op pain.    Cervical / Trunk Assessment: Kyphotic  Communication   Communication: No difficulties  Cognition Arousal/Alertness: Awake/alert Behavior During Therapy: WFL for tasks assessed/performed Overall Cognitive Status: Within Functional Limits for tasks assessed  General Comments      Exercises General Exercises - Lower Extremity Ankle Circles/Pumps: AROM;Both;10 reps      Assessment/Plan    PT Assessment Patient needs continued PT services  PT Diagnosis Difficulty walking;Acute pain   PT Problem List Decreased strength;Decreased activity tolerance;Decreased balance;Decreased mobility;Decreased coordination;Decreased knowledge of use of DME;Pain  PT Treatment Interventions DME instruction;Gait training;Functional mobility training;Therapeutic activities;Therapeutic exercise;Balance  training;Patient/family education   PT Goals (Current goals can be found in the Care Plan section) Acute Rehab PT Goals Patient Stated Goal: Back home and independent.   PT Goal Formulation: With patient Time For Goal Achievement: 01/17/14 Potential to Achieve Goals: Good    Frequency Min 3X/week   Barriers to discharge        Co-evaluation               End of Session Equipment Utilized During Treatment: Gait belt Activity Tolerance:  (Limited by nausea.  ) Patient left: in chair;with call bell/phone within reach;with family/visitor present Nurse Communication: Mobility status         Time: 0830-0904 PT Time Calculation (min): 34 min   Charges:   PT Evaluation $Initial PT Evaluation Tier I: 1 Procedure PT Treatments $Gait Training: 8-22 mins $Therapeutic Activity: 8-22 mins   PT G CodesCatarina Hartshorn, [place] 01/03/2014, 10:36 AM

## 2014-01-04 DIAGNOSIS — S7223XA Displaced subtrochanteric fracture of unspecified femur, initial encounter for closed fracture: Principal | ICD-10-CM

## 2014-01-04 LAB — CBC
HEMATOCRIT: 22.9 % — AB (ref 36.0–46.0)
Hemoglobin: 7.7 g/dL — ABNORMAL LOW (ref 12.0–15.0)
MCH: 30.7 pg (ref 26.0–34.0)
MCHC: 33.6 g/dL (ref 30.0–36.0)
MCV: 91.2 fL (ref 78.0–100.0)
PLATELETS: 161 10*3/uL (ref 150–400)
RBC: 2.51 MIL/uL — ABNORMAL LOW (ref 3.87–5.11)
RDW: 14.4 % (ref 11.5–15.5)
WBC: 8 10*3/uL (ref 4.0–10.5)

## 2014-01-04 LAB — BASIC METABOLIC PANEL
Anion gap: 10 (ref 5–15)
BUN: 18 mg/dL (ref 6–23)
CO2: 25 mEq/L (ref 19–32)
Calcium: 8.5 mg/dL (ref 8.4–10.5)
Chloride: 107 mEq/L (ref 96–112)
Creatinine, Ser: 0.99 mg/dL (ref 0.50–1.10)
GFR calc Af Amer: 56 mL/min — ABNORMAL LOW (ref >90)
GFR, EST NON AFRICAN AMERICAN: 49 mL/min — AB (ref >90)
Glucose, Bld: 132 mg/dL — ABNORMAL HIGH (ref 70–99)
POTASSIUM: 3.8 meq/L (ref 3.7–5.3)
Sodium: 142 mEq/L (ref 137–147)

## 2014-01-04 LAB — PREPARE RBC (CROSSMATCH)

## 2014-01-04 MED ORDER — SODIUM CHLORIDE 0.9 % IV SOLN
Freq: Once | INTRAVENOUS | Status: AC
  Administered 2014-01-04: 13:00:00 via INTRAVENOUS

## 2014-01-04 MED ORDER — ACETAMINOPHEN 500 MG PO TABS: 500.0000 mg | ORAL_TABLET | Freq: Four times a day (QID) | ORAL | Status: DC | PRN

## 2014-01-04 MED ORDER — ASPIRIN EC 81 MG PO TBEC: 81.0000 mg | DELAYED_RELEASE_TABLET | Freq: Every day | ORAL | Status: DC

## 2014-01-04 NOTE — Discharge Summary (Signed)
Physician Discharge Summary  Patient ID: Kristy Jarvis MRN: [id number] DOB/AGE: [birth year] 80 y.o.  Admit date: 01/02/2014 Discharge date: 01/04/2014  Admission Diagnoses: Subtrochanteric hip fracture  Discharge Diagnoses: Same Principal Problem:   Hip fracture Active Problems:   HYPERLIPIDEMIA   GERD   OSTEOPOROSIS   Hip subtrochanteric fracture   Nausea   Postoperative anemia due to acute blood loss   Discharged Condition: stable  Hospital Course: Patient's hospital course was essentially unremarkable. She underwent intramedullary nailing for a subtrochanteric hip fracture. Postoperatively patient progressed slowly and was discharged to skilled nursing.  Consults: None  Significant Diagnostic Studies: labs: Routine labs  Treatments: surgery: See operative note  Discharge Exam: Blood pressure 132/53, pulse 103, temperature 98.3 F (36.8 C), temperature source Oral, resp. rate 18, height 5\' 1"  (1.549 m), weight 45.813 kg (101 lb), SpO2 99.00%. Incision/Wound: dressing clean dry and intact  Disposition: 01-Home or Self Care  Discharge Instructions   Call MD / Call 911    Complete by:  As directed   If you experience chest pain or shortness of breath, CALL 911 and be transported to the hospital emergency room.  If you develope a fever above 101 F, pus (white drainage) or increased drainage or redness at the wound, or calf pain, call your surgeon's office.     Constipation Prevention    Complete by:  As directed   Drink plenty of fluids.  Prune juice may be helpful.  You may use a stool softener, such as Colace (over the counter) 100 mg twice a day.  Use MiraLax (over the counter) for constipation as needed.     Diet - low sodium heart healthy    Complete by:  As directed      Increase activity slowly as tolerated    Complete by:  As directed      Weight bearing as tolerated    Complete by:  As directed      Weight bearing as tolerated    Complete by:  As directed              Medication List         acetaminophen 500 MG tablet  Commonly known as:  TYLENOL  Take 1 tablet (500 mg total) by mouth every 6 (six) hours as needed for mild pain.     acetaminophen 500 MG tablet  Commonly known as:  TYLENOL  Take 1 tablet (500 mg total) by mouth every 6 (six) hours as needed for mild pain.     ASPIRIN LOW DOSE 81 MG EC tablet  Generic drug:  aspirin  Take 81 mg by mouth daily.     aspirin EC 325 MG tablet  Take 1 tablet (325 mg total) by mouth daily.     aspirin EC 81 MG tablet  Take 1 tablet (81 mg total) by mouth daily.     calcium carbonate 750 MG chewable tablet  Commonly known as:  TUMS EX  Chew 1 tablet by mouth daily.     lovastatin 40 MG tablet  Commonly known as:  MEVACOR  Take 40 mg by mouth at bedtime.     omeprazole 20 MG capsule  Commonly known as:  PRILOSEC  Take 20 mg by mouth daily.     Vitamin D 400 UNITS capsule  Take 800 Units by mouth daily.           Follow-up Information   Follow up with DUDA,MARCUS V, MD In 2 weeks.  Specialty:  Orthopedic Surgery   Contact information:   [place] [address] [phone]       Signed: Newt Minion 01/04/2014, 8:04 AM

## 2014-01-04 NOTE — Progress Notes (Signed)
[facility] PROGRESS NOTE  SHYLOH EHMANN [id number] DOB: 08/30/1920 DOA: 01/02/2014 PCP: Tula Nakayama, MD  Assessment/Plan  Left hip fracture s/p ORIF with IM nailing on 8/14 by Dr. Sharol Given -  PT/OT -  Weight bearing, dressings, VTE prophylaxis, and follow up per orthopedics  Left knee effusion likely due to trauma from fall, no hx of significant arthritis and was not enlarged prior to fall.  Improving spontaneously.  -  Consider arthrocentesis for comfort if painful, defer to orthopedics  GERD, stable, continue proton pump inhibitor (this increases risk of OP)  Hyperlipidemia, stable, continue statin  Osteoporosis. Continue calcium and vitamin D  Borderline leukocytosis, likely related to surgery last night -  Monitor for signs of infection  Acute blood loss anemia due to surgery, hemoglobin trending down and now fatigued and tachycardic -  Transfuse 1 unit PRBC -  Repeat CBC in AM  Diet:  Regular Access:  PIV IVF:  off Proph:  lovenox + ASA   Code Status: full Family Communication: patient, daughter, and granddaughter Disposition Plan:  pending PT/OT, likely Monday   Consultants:  Ortho, Dr. Sharol Given  Procedures:  ORIF/IM left hip 8/14  Antibiotics:  none   HPI/Subjective:  Pain in left hip when moving, but otherwise minimal pain.  Had BM today.  Fatigued when getting up today.  Nausea improved but has not eaten lunch.    Objective: Filed Vitals:   01/03/14 2046 01/04/14 0000 01/04/14 0400 01/04/14 0558  BP: 121/47   132/53  Pulse: 74   103  Temp: 98.6 F (37 C)   98.3 F (36.8 C)  TempSrc: Oral   Oral  Resp: 16 16 16 18   Height:      Weight:      SpO2: 96% 96% 96% 99%    Intake/Output Summary (Last 24 hours) at 01/04/14 1248 Last data filed at 01/04/14 0500  Gross per 24 hour  Intake    480 ml  Output    350 ml  Net    130 ml   Filed Weights   01/03/14 0100  Weight: 45.813 kg (101 lb)    Exam:   General:  WF, No acute  distress, lying bed  HEENT:  NCAT, MMM  Cardiovascular:  RRR, nl S1, S2 no mrg, 2+ pulses, warm extremities  Respiratory:  CTAB, no increased WOB  Abdomen:   NABS, soft, NT/ND  MSK:   Normal tone and bulk, left knee effusion, left hip dressing with some blood stains, no ecchymoses, induration, swelling, fluctuance  Neuro:  Grossly intact  Data Reviewed: Basic Metabolic Panel:  Recent Labs Lab 01/02/14 1240 01/02/14 2342 01/03/14 0525 01/04/14 0625  NA 144  --  141 142  K 4.1  --  4.9 3.8  CL 104  --  107 107  CO2 26  --  22 25  GLUCOSE 126*  --  152* 132*  BUN 19  --  17 18  CREATININE 1.05 0.85 0.87 0.99  CALCIUM 9.5  --  8.5 8.5   Liver Function Tests: No results found for this basename: AST, ALT, ALKPHOS, BILITOT, PROT, ALBUMIN,  in the last 168 hours No results found for this basename: LIPASE, AMYLASE,  in the last 168 hours No results found for this basename: AMMONIA,  in the last 168 hours CBC:  Recent Labs Lab 01/02/14 1240 01/02/14 2342 01/03/14 0525 01/04/14 0625  WBC 9.3 9.7 10.9* 8.0  NEUTROABS 6.3  --   --   --  HGB 14.0 11.2* 9.9* 7.7*  HCT 41.9 34.3* 30.2* 22.9*  MCV 90.5 90.7 90.4 91.2  PLT 220 194 197 161   Cardiac Enzymes: No results found for this basename: CKTOTAL, CKMB, CKMBINDEX, TROPONINI,  in the last 168 hours BNP (last 3 results) No results found for this basename: PROBNP,  in the last 8760 hours CBG: No results found for this basename: GLUCAP,  in the last 168 hours  No results found for this or any previous visit (from the past 240 hour(s)).   Studies: Dg Hip Operative Left  01/02/2014   CLINICAL DATA:  ORIF of a fracture of the proximal left humerus.  EXAM: OPERATIVE LEFT HIP  COMPARISON:  01/02/2014 at 12:12 p.m.  FINDINGS: Single portable image shows a Njeri Vicente intra medullary rod supporting 810, reducing the major fracture fragments into near anatomic alignment. There is no evidence of an operative complication. Orthopedic  hardware is well-seated.  IMPRESSION: ORIF of a proximal left femur fracture as described.   Electronically Signed   By: Lajean Manes M.D.   On: 01/02/2014 18:28    Scheduled Meds: . aspirin EC  325 mg Oral Q breakfast  . calcium carbonate  1.5 tablet Oral Daily  . cholecalciferol  800 Units Oral Daily  . docusate sodium  100 mg Oral BID  . enoxaparin (LOVENOX) injection  30 mg Subcutaneous Q24H  . feeding supplement (ENSURE COMPLETE)  237 mL Oral BID BM  . pantoprazole  40 mg Oral Daily  . polyethylene glycol  17 g Oral Daily  . senna  2 tablet Oral QHS  . simvastatin  20 mg Oral q1800   Continuous Infusions: . sodium chloride 10 mL/hr at 01/03/14 0840    Principal Problem:   Hip fracture Active Problems:   HYPERLIPIDEMIA   GERD   OSTEOPOROSIS   Hip subtrochanteric fracture   Nausea   Postoperative anemia due to acute blood loss    Time spent: 30 min    Adonijah Baena, [facility]  [facility] Pager [phone]. If 7PM-7AM, please contact night-coverage at [email or web address], password [facility] 01/04/2014, 12:48 PM  LOS: 2 days

## 2014-01-04 NOTE — Progress Notes (Signed)
Physical Therapy Treatment Patient Details Name: Kristy Jarvis MRN: [id number] DOB: 01-26-1921 Today's Date: 2014/01/22    History of Present Illness pt presents after fall resulting in L hip fx, now post IM nail.      PT Comments    Pt very fatigued this pm from having to get to [facility] commode twice this afternoon.  Pt was agreeable to exercise in bed, but not to get up again.   Follow Up Recommendations        Equipment Recommendations       Recommendations for Other Services       Precautions / Restrictions Precautions Precautions: Fall Restrictions Weight Bearing Restrictions: Yes LLE Weight Bearing: Weight bearing as tolerated    Mobility  Bed Mobility Overal bed mobility:  (NT.  Pt too fatigued this pm for OOB activities)                Transfers                    Ambulation/Gait                 Stairs            Wheelchair Mobility    Modified Rankin (Stroke Patients Only)       Balance                                    Cognition Arousal/Alertness: Awake/alert Behavior During Therapy: WFL for tasks assessed/performed Overall Cognitive Status: Within Functional Limits for tasks assessed                      Exercises General Exercises - Lower Extremity Ankle Circles/Pumps: AROM;Both;10 reps;Supine Short Arc Quad: AROM;Both;10 reps;Supine Heel Slides: AAROM;Left;10 reps;Supine Hip ABduction/ADduction: AAROM;Right;Left;10 reps;Supine    General Comments        Pertinent Vitals/Pain Pain Assessment: 0-10 Pain Score: 6  Pain Location: l hip Pain Intervention(s): Limited activity within patient's tolerance;Repositioned    Home Living                      Prior Function            PT Goals (current goals can now be found in the care plan section) Progress towards PT goals: Progressing toward goals    Frequency       PT Plan Current plan remains appropriate     Co-evaluation             End of Session   Activity Tolerance: Patient limited by fatigue Patient left: in bed;with call bell/phone within reach     Time: [id number] PT Time Calculation (min): 10 min  Charges:  $Therapeutic Exercise: 8-22 mins                    G Codes:      Melvern Banker 01/22/2014, 3:22 PM Lavonia Dana, PT  [phone] 01/22/2014

## 2014-01-04 NOTE — Clinical Social Work Placement (Addendum)
Clinical Social Work Department CLINICAL SOCIAL WORK PLACEMENT NOTE 01/04/2014  Patient:  Kristy Jarvis, Kristy Jarvis  Account Number:  [id number] Admit date:  01/02/2014  Clinical Social Worker:  Wylene Men  Date/time:  01/04/2014 04:08 PM  Clinical Social Work is seeking post-discharge placement for this patient at the following level of care:   SKILLED NURSING   (*CSW will update this form in Epic as items are completed)   01/04/2014  Patient/family provided with [place] Department of Clinical Social Work's list of facilities offering this level of care within the geographic area requested by the patient (or if unable, by the patient's family).  01/04/2014  Patient/family informed of their freedom to choose among providers that offer the needed level of care, that participate in Medicare, Medicaid or managed care program needed by the patient, have an available bed and are willing to accept the patient.  01/04/2014  Patient/family informed of [facility]' ownership interest in [facility], as well as of the fact that they are under no obligation to receive care at this facility.  PASARR submitted to EDS on 01/04/2014 PASARR number received on 01/04/2014  FL2 transmitted to all facilities in geographic area requested by pt/family on  01/04/2014 FL2 transmitted to all facilities within larger geographic area on   Patient informed that his/her managed care company has contracts with or will negotiate with  certain facilities, including the following:     Patient/family informed of bed offers received:  01/05/14 [facility] [place], [place], [place]) Patient chooses bed at [facility] ([place], [place], [phone]) Physician recommends and patient chooses bed at  [facility] [place], [place], [place])  Patient to be transferred to [facility] on  01/05/14 ([place], [place], [place]) Patient to be transferred to facility by [facility], [place],  [phone]) Patient and family notified of transfer on 01/05/14 ([place], [place], [place]) Name of family member notified:  Wendy [place], [place], [phone])  The following physician request were entered in Epic:   Additional Comments: Nonnie Done, Latanya Presser [phone]  Clinical Social Work

## 2014-01-04 NOTE — Progress Notes (Signed)
Patient ID: Kristy Jarvis, female   DOB: May 17, 1921, 80 y.o.   MRN: [id number] Postoperative day 2 intramedullary nailing hip fracture. Patient is comfortable this morning. Plan for discharge to skilled nursing. I felt to was signed. Patient wishes to go to [facility] center.

## 2014-01-05 ENCOUNTER — Inpatient Hospital Stay
Admission: RE | Admit: 2014-01-05 | Discharge: 2014-01-26 | Disposition: A | Discharge status: Home / Self Care | Payer: Medicare Other | Source: Ambulatory Visit | Attending: Internal Medicine | Admitting: Internal Medicine

## 2014-01-05 ENCOUNTER — Encounter (HOSPITAL_COMMUNITY): Payer: Self-pay | Admitting: Orthopedic Surgery

## 2014-01-05 DIAGNOSIS — S72009A Fracture of unspecified part of neck of unspecified femur, initial encounter for closed fracture: Secondary | ICD-10-CM

## 2014-01-05 DIAGNOSIS — E440 Moderate protein-calorie malnutrition: Secondary | ICD-10-CM

## 2014-01-05 LAB — TYPE AND SCREEN
ABO/RH(D): O POS
Antibody Screen: NEGATIVE
Unit division: 0

## 2014-01-05 LAB — CBC
HCT: 27.7 % — ABNORMAL LOW (ref 36.0–46.0)
HEMOGLOBIN: 9.5 g/dL — AB (ref 12.0–15.0)
MCH: 30.4 pg (ref 26.0–34.0)
MCHC: 34.3 g/dL (ref 30.0–36.0)
MCV: 88.5 fL (ref 78.0–100.0)
Platelets: 155 10*3/uL (ref 150–400)
RBC: 3.13 MIL/uL — ABNORMAL LOW (ref 3.87–5.11)
RDW: 14.4 % (ref 11.5–15.5)
WBC: 10 10*3/uL (ref 4.0–10.5)

## 2014-01-05 MED ORDER — BISACODYL 10 MG RE SUPP: 10.0000 mg | Freq: Every day | RECTAL | Status: DC | PRN

## 2014-01-05 MED ORDER — CHLORHEXIDINE GLUCONATE 4 % EX LIQD: 60.0000 mL | Freq: Once | CUTANEOUS | Status: DC

## 2014-01-05 MED ORDER — DSS 100 MG PO CAPS: 100.0000 mg | ORAL_CAPSULE | Freq: Two times a day (BID) | ORAL | Status: DC

## 2014-01-05 MED ORDER — ENSURE COMPLETE PO LIQD: 237.0000 mL | Freq: Two times a day (BID) | ORAL | Status: DC

## 2014-01-05 MED ORDER — CLINDAMYCIN PHOSPHATE 900 MG/50ML IV SOLN: 900.0000 mg | INTRAVENOUS | Status: DC

## 2014-01-05 NOTE — Discharge Planning (Signed)
CSW confirmed dc to SNF- [facility] received RN to call report to [phone] Transportation provided by [facility] CSW to schedule for 1:30 per RN  CSW provided update to daughter, Rodena Piety (at bedside).  Nonnie Done, [place] [phone]  Clinical Social Work

## 2014-01-05 NOTE — Discharge Summary (Signed)
Patient alert and comfortable this morning. Plan for discharge to skilled nursing.

## 2014-01-05 NOTE — Clinical Social Work Note (Signed)
CSW received [facility] authorization: auth. number [id number].    Domenica Reamer, [place] Social Worker [phone]

## 2014-01-05 NOTE — Clinical Social Work Note (Signed)
CSW called for [facility].  Domenica Reamer, [place] Social Worker [phone]

## 2014-01-05 NOTE — Discharge Summary (Signed)
Physician Discharge Summary  Kristy Jarvis [id number] DOB: 12/26/1920 DOA: 01/02/2014  PCP: Tula Nakayama, MD  Admit date: 01/02/2014 Discharge date: 01/05/2014  Recommendations for Outpatient Follow-up:  1. To SNF for ongoing PT/OT 2. WBAT  Discharge Diagnoses:  Principal Problem:   Hip subtrochanteric fracture Active Problems:   HYPERLIPIDEMIA   GERD   OSTEOPOROSIS   Hip fracture   Nausea   Postoperative anemia due to acute blood loss   Moderate protein-calorie malnutrition   Discharge Condition: stable, improved  Diet recommendation: regular with supplements  Wt Readings from Last 3 Encounters:  01/03/14 45.813 kg (101 lb)  01/03/14 45.813 kg (101 lb)  12/18/13 46.176 kg (101 lb 12.8 oz)    History of present illness:   80 yo F with no signficant past medical history who presented with mechanical fall resulting in left hip intertrochanteric fracture.    Hospital Course:   Left hip fracture s/p ORIF with IM nailing on 8/14 by Dr. Sharol Given without complication.  She should be weight bearing as tolerated.  Dry dressing changes as needed.  PT/OT recommended SNF for rehabilitation.  Continue daily ASA 325mg  for DVT prophylaxis for 4 weeks, 9/15, then resume 81mg  daily thereafter.  Follow up with Dr. Sharol Given in 2 weeks for eevaluation.     Left knee effusion likely due to trauma from fall, no hx of significant arthritis and was not enlarged prior to fall. Improved spontaneously.   Orthopedics recommended against arthrocentesis since effusion was not painful and resolving spontaneously.  GERD, stable, continued proton pump inhibitor Hyperlipidemia, stable, continued statin  Osteoporosis. Continued calcium and vitamin D  Borderline leukocytosis of 10.9, likely related to surgery and resolved spontaneously.  Afebrile, no signs of infection.    Acute blood loss anemia due to surgery, hemoglobin trended down to 7.7 on 8/16 and she developed increased fatigue and tachycardia  to the low 100s.  She was transfused 1 unit PRBC and her symptoms improved.  Hgb at discharge 9.5 mg/dl.     Moderate protein-calorie malnutrition and frail.  Recommend regular diet with supplements.    Consultants:  Ortho, Dr. Sharol Given Procedures:  ORIF/IM left hip 8/14 Antibiotics:  none   Discharge Exam: Filed Vitals:   01/05/14 0535  BP: 135/51  Pulse: 80  Temp: 98.5 F (36.9 C)  Resp: 16   Filed Vitals:   01/04/14 1830 01/04/14 1840 01/04/14 2153 01/05/14 0535  BP: 147/58 140/56 133/57 135/51  Pulse: 88 87 93 80  Temp: 98.7 F (37.1 C) 99.1 F (37.3 C) 99.1 F (37.3 C) 98.5 F (36.9 C)  TempSrc: Oral Oral Oral Oral  Resp: 18 18 16 16   Height:      Weight:      SpO2: 99% 95% 94% 94%   General: WF, No acute distress, sitting in chair HEENT: NCAT, MMM  Cardiovascular: RRR, nl S1, S2 no mrg, 2+ pulses, warm extremities  Respiratory: CTAB, no increased WOB  Abdomen: NABS, soft, NT/ND  MSK: Normal tone and bulk, left knee effusion, left hip dressing with some blood stains, no ecchymoses, induration, swelling, fluctuance  Neuro: Grossly intact   Discharge Instructions      Discharge Instructions   Call MD / Call 911    Complete by:  As directed   If you experience chest pain or shortness of breath, CALL 911 and be transported to the hospital emergency room.  If you develope a fever above 101 F, pus (white drainage) or increased drainage or redness at the  wound, or calf pain, call your surgeon's office.     Call MD / Call 911    Complete by:  As directed   If you experience chest pain or shortness of breath, CALL 911 and be transported to the hospital emergency room.  If you develope a fever above 101 F, pus (white drainage) or increased drainage or redness at the wound, or calf pain, call your surgeon's office.     Constipation Prevention    Complete by:  As directed   Drink plenty of fluids.  Prune juice may be helpful.  You may use a stool softener, such as Colace  (over the counter) 100 mg twice a day.  Use MiraLax (over the counter) for constipation as needed.     Constipation Prevention    Complete by:  As directed   Drink plenty of fluids.  Prune juice may be helpful.  You may use a stool softener, such as Colace (over the counter) 100 mg twice a day.  Use MiraLax (over the counter) for constipation as needed.     Diet general    Complete by:  As directed      Increase activity slowly as tolerated    Complete by:  As directed      Increase activity slowly as tolerated    Complete by:  As directed      Weight bearing as tolerated    Complete by:  As directed      Weight bearing as tolerated    Complete by:  As directed             Medication List         acetaminophen 500 MG tablet  Commonly known as:  TYLENOL  Take 1 tablet (500 mg total) by mouth every 6 (six) hours as needed for mild pain.     aspirin EC 325 MG tablet  Take 1 tablet (325 mg total) by mouth daily.     bisacodyl 10 MG suppository  Commonly known as:  DULCOLAX  Place 1 suppository (10 mg total) rectally daily as needed for moderate constipation.     calcium carbonate 750 MG chewable tablet  Commonly known as:  TUMS EX  Chew 1 tablet by mouth daily.     DSS 100 MG Caps  Take 100 mg by mouth 2 (two) times daily.     feeding supplement (ENSURE COMPLETE) Liqd  Take 237 mLs by mouth 2 (two) times daily between meals.     lovastatin 40 MG tablet  Commonly known as:  MEVACOR  Take 40 mg by mouth at bedtime.     omeprazole 20 MG capsule  Commonly known as:  PRILOSEC  Take 20 mg by mouth daily.     Vitamin D 400 UNITS capsule  Take 800 Units by mouth daily.       Follow-up Information   Follow up with DUDA,MARCUS V, MD In 2 weeks.   Specialty:  Orthopedic Surgery   Contact information:   [place] [address] [phone]        The results of significant diagnostics from this hospitalization (including imaging, microbiology,  ancillary and laboratory) are listed below for reference.    Significant Diagnostic Studies: Dg Chest 1 View  01/02/2014   CLINICAL DATA:  Fall.  Left hip fracture.  Preop respiratory exam.  EXAM: CHEST - 1 VIEW  COMPARISON:  08/06/2007  FINDINGS: Mild cardiomegaly stable. Large hiatal hernia again seen. Both lungs are  clear.  IMPRESSION: No active lung disease.  Large hiatal hernia.  Stable cardiomegaly.   Electronically Signed   By: Earle Gell M.D.   On: 01/02/2014 12:42   Dg Hip Complete Left  01/02/2014   CLINICAL DATA:  Fall.  Left hip pain and deformity.  EXAM: LEFT HIP - COMPLETE 2+ VIEW  COMPARISON:  None.  FINDINGS: An intertrochanteric left hip fracture is seen with medial displacement and varus angulation. No evidence of dislocation. No pelvic fracture identified.  IMPRESSION: Intertrochanteric left hip fracture.   Electronically Signed   By: Earle Gell M.D.   On: 01/02/2014 12:41   Dg Hip Operative Left  01/02/2014   CLINICAL DATA:  ORIF of a fracture of the proximal left humerus.  EXAM: OPERATIVE LEFT HIP  COMPARISON:  01/02/2014 at 12:12 p.m.  FINDINGS: Single portable image shows a Leilyn Frayre intra medullary rod supporting 810, reducing the major fracture fragments into near anatomic alignment. There is no evidence of an operative complication. Orthopedic hardware is well-seated.  IMPRESSION: ORIF of a proximal left femur fracture as described.   Electronically Signed   By: Lajean Manes M.D.   On: 01/02/2014 18:28    Microbiology: No results found for this or any previous visit (from the past 240 hour(s)).   Labs: Basic Metabolic Panel:  Recent Labs Lab 01/02/14 1240 01/02/14 2342 01/03/14 0525 01/04/14 0625  NA 144  --  141 142  K 4.1  --  4.9 3.8  CL 104  --  107 107  CO2 26  --  22 25  GLUCOSE 126*  --  152* 132*  BUN 19  --  17 18  CREATININE 1.05 0.85 0.87 0.99  CALCIUM 9.5  --  8.5 8.5   Liver Function Tests: No results found for this basename: AST, ALT, ALKPHOS,  BILITOT, PROT, ALBUMIN,  in the last 168 hours No results found for this basename: LIPASE, AMYLASE,  in the last 168 hours No results found for this basename: AMMONIA,  in the last 168 hours CBC:  Recent Labs Lab 01/02/14 1240 01/02/14 2342 01/03/14 0525 01/04/14 0625 01/05/14 0617  WBC 9.3 9.7 10.9* 8.0 10.0  NEUTROABS 6.3  --   --   --   --   HGB 14.0 11.2* 9.9* 7.7* 9.5*  HCT 41.9 34.3* 30.2* 22.9* 27.7*  MCV 90.5 90.7 90.4 91.2 88.5  PLT 220 194 197 161 155   Cardiac Enzymes: No results found for this basename: CKTOTAL, CKMB, CKMBINDEX, TROPONINI,  in the last 168 hours BNP: BNP (last 3 results) No results found for this basename: PROBNP,  in the last 8760 hours CBG: No results found for this basename: GLUCAP,  in the last 168 hours  Time coordinating discharge: 35 minutes  Signed:  Geana Walts  [facility] 01/05/2014, 9:07 AM

## 2014-01-05 NOTE — Care Management Note (Signed)
CARE MANAGEMENT NOTE 01/05/2014  Patient:  Kristy Jarvis, Kristy Jarvis   Account Number:  [id number]  Date Initiated:  01/05/2014  Documentation initiated by:  Ricki Miller  Subjective/Objective Assessment:   80 yr old female s/p fall , left hip ORIF andIM nailing.     Action/Plan:   patient will require shortterm rehab at [facility]. Social worker is aware.   Anticipated DC Date:  01/05/2014   Anticipated DC Plan:  SKILLED NURSING FACILITY  In-house referral  Clinical Social Worker      DC Planning Services  CM consult      Choice offered to / List presented to:     DME arranged  NA        [place] arranged  NA      Status of service:  Completed, signed off Medicare Important Message given?  YES (If response is "NO", the following Medicare IM given date fields will be blank) Date Medicare IM given:  01/05/2014 Medicare IM given by:  Ricki Miller Date Additional Medicare IM given:   Additional Medicare IM given by:    Discharge Disposition:  [place]  Per UR Regulation:  Reviewed for med. necessity/level of care/duration of stay  If discussed at [place] of Stay Meetings, dates discussed:    Comments:

## 2014-01-05 NOTE — Progress Notes (Signed)
Utilization review completed.  

## 2014-01-05 NOTE — Progress Notes (Signed)
Physical Therapy Treatment Patient Details Name: Kristy Jarvis MRN: [id number] DOB: [birth year] Today's Date: 01/05/2014    History of Present Illness pt presents after fall resulting in L hip fx, now post IM nail.      PT Comments    Pt very agreeable to mobility, but grimaces and grabs for L hip during mobility.  Per epic, pt has not had pain meds since 0451 on 8/16.  RN made aware and pt encouraged to take some pain meds to better manage pain during mobility.  Will continue to follow.    Follow Up Recommendations  SNF     Equipment Recommendations  Rolling walker with 5" wheels;3in1 (PT)    Recommendations for Other Services       Precautions / Restrictions Precautions Precautions: Fall Restrictions Weight Bearing Restrictions: Yes LLE Weight Bearing: Weight bearing as tolerated    Mobility  Bed Mobility Overal bed mobility: Needs Assistance Bed Mobility: Supine to Sit     Supine to sit: Max assist;HOB elevated     General bed mobility comments: cues for sequencing and encouragement.    Transfers Overall transfer level: Needs assistance Equipment used: 1 person hand held assist Transfers: Sit to/from [facility] Sit to Stand: Max assist Stand pivot transfers: Mod assist       General transfer comment: cues for UE use and movement through pivot.  pt demos good follow through on cueing.    Ambulation/Gait                 Stairs            Wheelchair Mobility    Modified Rankin (Stroke Patients Only)       Balance                                    Cognition Arousal/Alertness: Awake/alert Behavior During Therapy: WFL for tasks assessed/performed Overall Cognitive Status: Within Functional Limits for tasks assessed                      Exercises General Exercises - Lower Extremity Ankle Circles/Pumps: AROM;Both;10 reps Quad Sets: AROM;Both;10 reps Short Arc Quad: AROM;Left;10 reps Long  Arc Quad: AROM;Left;10 reps Hip ABduction/ADduction: AAROM;Left;10 reps    General Comments        Pertinent Vitals/Pain Pain Assessment: 0-10 Pain Score: 5  Pain Location: L hip Pain Descriptors / Indicators: Grimacing Pain Intervention(s): Limited activity within patient's tolerance;Monitored during session;Patient requesting pain meds-RN notified;Repositioned    Home Living                      Prior Function            PT Goals (current goals can now be found in the care plan section) Acute Rehab PT Goals PT Goal Formulation: With patient Time For Goal Achievement: 01/17/14 Potential to Achieve Goals: Good Progress towards PT goals: Progressing toward goals    Frequency  Min 3X/week    PT Plan Current plan remains appropriate    Co-evaluation             End of Session Equipment Utilized During Treatment: Gait belt Activity Tolerance: Patient limited by pain Patient left: in chair;with call bell/phone within reach     Time: 0801-0819 PT Time Calculation (min): 18 min  Charges:  $Therapeutic Activity: 8-22 mins  G CodesCatarina Jarvis, [place] 01/05/2014, 8:26 AM

## 2014-01-07 ENCOUNTER — Non-Acute Institutional Stay (SKILLED_NURSING_FACILITY): Payer: Medicare Other | Admitting: Internal Medicine

## 2014-01-07 DIAGNOSIS — M25469 Effusion, unspecified knee: Secondary | ICD-10-CM

## 2014-01-07 DIAGNOSIS — S72142S Displaced intertrochanteric fracture of left femur, sequela: Secondary | ICD-10-CM

## 2014-01-07 DIAGNOSIS — M11262 Other chondrocalcinosis, left knee: Secondary | ICD-10-CM

## 2014-01-07 DIAGNOSIS — S72009S Fracture of unspecified part of neck of unspecified femur, sequela: Secondary | ICD-10-CM

## 2014-01-07 DIAGNOSIS — M11869 Other specified crystal arthropathies, unspecified knee: Secondary | ICD-10-CM

## 2014-01-08 NOTE — Progress Notes (Addendum)
Patient ID: ELAF CHELL, female   DOB: Jun 01, 1920, 80 y.o.   MRN: [id number]            HISTORY & PHYSICAL  DATE:  01/07/2014    FACILITY: [facility]    LEVEL OF CARE:   SNF   HISTORY OF PRESENT ILLNESS:  This is a 80 year-old, fully independent woman who lives on her own in [place], still independent with ADLs and IADLs, still participating in delivering Meals on Wheels.    She had a fall while turning around after giving flowers to a person at a funeral home.  Her evaluation showed an intertrochanteric left hip fracture.  She was transferred to [facility] for surgical treatment.  She underwent an ORIF.  She tolerated the procedure well and is here for rehabilitation.    PAST MEDICAL HISTORY/PROBLEM LIST:  Intermittent vertigo.    Renal colic.    Osteoporosis.    Hyperlipidemia.    Gastroesophageal reflux.    Diverticulosis of the colon by CT scan.    Postoperative nausea and vomiting.    Cataract.    PAST SURGICAL HISTORY:    Cataract extraction, left eye.    Cholecystectomy.    Abdominal hysterectomy.    Prominent Schatzki's ring.    Bunionectomy on the left.    CURRENT MEDICATIONS:  Discharge medications include:      Vitamin D3, 800 U once a day.    Calcium carbonate 300 mg once a day.    Omeprazole 20 q.d.    Lovastatin 40 q.d.    Colace 100 twice a day.    Aspirin 325 once a day.    SOCIAL HISTORY: FUNCTIONAL STATUS:  Independent with ADLs and IADLs.  No history of falling.  No ambulatory assist devices.   TOBACCO USE:  Nonsmoker.    REVIEW OF SYSTEMS:   CHEST/RESPIRATORY:  No shortness of breath.  CARDIAC:   No chest pain or palpitations.   GI:  No nausea, vomiting or diarrhea.   MUSCULOSKELETAL:  Complaining of some pain in the left knee.    PHYSICAL EXAMINATION:   GENERAL APPEARANCE:  The patient is awake, alert, conversational.   CHEST/RESPIRATORY:  Shallow, but otherwise clear air entry bilaterally.    CARDIOVASCULAR:  CARDIAC:   Heart sounds are normal.  There are no murmurs.  She appears to be euvolemic.   GASTROINTESTINAL:  ABDOMEN:   Soft.   LIVER/SPLEEN/KIDNEYS:  No liver, no spleen.  No tenderness.   GENITOURINARY:  BLADDER:   No suprapubic tenderness or fullness is noted.   SKIN:  INSPECTION:  Left hip incision looks stable.   MUSCULOSKELETAL:   EXTREMITIES:   LEFT LOWER EXTREMITY:  Left knee:  There is a small effusion here with some warmth.  Her ligaments seem stable.    ASSESSMENT/PLAN:  Status post left hip fracture.  Status post ORIF.  She is not on DVT prophylaxis in this setting.  I will probably change her to Xarelto.  There are no hemostasis issues here that I am aware of.    Effusion of the left knee.   In this setting, I think this is probably likely to be chondrocalcinosis.  I am going to give her a short course of prednisone to see if I can settle this down without hampering her attempt at therapy.     Probable significant osteoporosis.    Hyperlipidemia.  On lovastatin, I think as primary prevention.

## 2014-01-13 ENCOUNTER — Other Ambulatory Visit: Payer: Self-pay | Admitting: *Deleted

## 2014-01-13 MED ORDER — HYDROCODONE-ACETAMINOPHEN 5-325 MG PO TABS: ORAL_TABLET | ORAL | Status: DC

## 2014-01-13 NOTE — Telephone Encounter (Signed)
[facility] 

## 2014-01-25 ENCOUNTER — Non-Acute Institutional Stay (SKILLED_NURSING_FACILITY): Payer: Medicare Other | Admitting: Internal Medicine

## 2014-01-25 ENCOUNTER — Encounter: Payer: Self-pay | Admitting: Internal Medicine

## 2014-01-25 DIAGNOSIS — K219 Gastro-esophageal reflux disease without esophagitis: Secondary | ICD-10-CM

## 2014-01-25 DIAGNOSIS — S72009D Fracture of unspecified part of neck of unspecified femur, subsequent encounter for closed fracture with routine healing: Secondary | ICD-10-CM

## 2014-01-25 DIAGNOSIS — R609 Edema, unspecified: Secondary | ICD-10-CM

## 2014-01-25 DIAGNOSIS — D62 Acute posthemorrhagic anemia: Secondary | ICD-10-CM

## 2014-01-25 DIAGNOSIS — S72142D Displaced intertrochanteric fracture of left femur, subsequent encounter for closed fracture with routine healing: Secondary | ICD-10-CM

## 2014-01-25 DIAGNOSIS — M810 Age-related osteoporosis without current pathological fracture: Secondary | ICD-10-CM

## 2014-01-25 DIAGNOSIS — S72142A Displaced intertrochanteric fracture of left femur, initial encounter for closed fracture: Secondary | ICD-10-CM | POA: Insufficient documentation

## 2014-01-25 NOTE — Progress Notes (Signed)
Patient ID: Kristy Jarvis, female   DOB: 25-Mar-1921, 80 y.o.   MRN: [id number]   this is a discharge note.  Level care skilled.  Facility [facility]   HISTORY OF PRESENT ILLNESS: This is a 80 year-old, fully independent woman who lives on her own in [place], wasl independent with ADLs and IADLs, still participating in delivering Meals on Wheels.  She had a fall while turning around after giving flowers to a person at a funeral home. Her evaluation showed an intertrochanteric left hip fracture. She was transferred to [facility] for surgical treatment. She underwent an ORIF. She tolerated the procedure well andwas here for rehabilitation.  She has done well-she will be going home with family-she still was quite unsteady on her feet and will need a rolling walker and wheelchair which apparently she has at home-she would benefit from continued PT and OT-she does continue on Xarelto  for anticoagulation  She was seen by orthopedics several days ago and thought to be doing well with order for weight bearing as tolerated no restrictions--x-rays were stable-  Today she has no complaints is looking forward to going home  PAST MEDICAL HISTORY/PROBLEM LIST:  Intermittent vertigo.  Renal colic.  Osteoporosis.  Hyperlipidemia.  Gastroesophageal reflux.  Diverticulosis of the colon by CT scan.  Postoperative nausea and vomiting.  Cataract.  PAST SURGICAL HISTORY:  Cataract extraction, left eye.  Cholecystectomy.  Abdominal hysterectomy.  Prominent Schatzki's ring.  Bunionectomy on the left.  CURRENT MEDICATIONS: Discharge medications include:  Vitamin D3, 800 U once a day.  Calcium carbonate 300 mg once a day.  Omeprazole 20 q.d.  Lovastatin 40 q.d.  Colace 100 twice a day.  Aspirin 81 mg once a day Xarelto  10 mg QD .  SOCIAL HISTORY:  FUNCTIONAL STATUS: Independent with ADLs and IADLs. No history of falling. No ambulatory assist devices.  TOBACCO USE: Nonsmoker .  REVIEW OF  SYSTEMS: Gen. no complaints of fever or chills.  Skin is not complaining of issues surgical site appears to be healing unremarkably left hip   CHEST/RESPIRATORY: No shortness of breath.  CARDIAC: No chest pain or palpitations--has some  mild pedal edema bilaterally more so on the left.  GI: No nausea, vomiting or diarrhea. No abdominal pain  MUSCULOSKELETAL: At this time no pain complaints.--I know Dr. Dellia Nims did treat her with a short course of prednisone previously for a left knee effusion-she says the pain is much better  Neurologic is not complaining of dizziness syncope or numbness.  Psych is not complaining of depression or anxiety .  PHYSICAL EXAMINATION: Temperature 98.2-pulse 88-respirations 20-blood pressure 130/66-112/68--weight is 102.6 this appears loss of about 3 pounds since her admission 3 weeks ago however this could be a scale variation since previous weights were quite stable  GENERAL APPEARANCE: The patient is awake, alert, conversational Skin is warm and dry surgical site left hip has a well-healed surgical scar there still a few Steri-Strips in place no drainage bleeding or sign of infection.  CHEST/RESPIRATORY: Shallow, but otherwise clear air entry bilaterally.  CARDIOVASCULAR:  CARDIAC: Heart sounds are normal. There are no murmurs. She appears to be euvolemic she has a mild pedal edema more so on the left equal pulses are intact--.  GASTROINTESTINAL:  ABDOMEN: Soft--nontender with positive bowel sounds . Muscle skeletal-moves all extremities x4 is able to stand but is quite unsteady with any attempted ambulation--still has a small effusion on her left knee but she says the pain is much better-there is a  small violaceous bruise superior to the knee this does not look new-the knee itself is minimally tender to palpation Neurologic is grossly intact speech is clear no lateralizing findings.  Psych she is alert and oriented pleasant and  appropriate.  Labs.  01/12/2014. . WBC 11.3-hemoglobin 10.4-platelets 539 3  01/02/2014.  Sodium 144-potassium 4.1-BUN 19-creatinine 1.05  WC 9.3-hemoglobin 14.0-platelets 220.     .   .   ASSESSMENT/PLAN:  Status post left hip fracture. Status post ORIF--she will need continued PT and OT-she is now weightbearing as tolerated per orthopedic recommendation--the Norco appears to be helping with pain she continues on Xarelto for anticoagulation. Mild edema left foot with her history of fracture and repair will order a venous Doppler before discharge although my suspicion is quite low here for any acute process  Anemia-I suspect this is postop hemoglobin actually was 10.4 on the 24th we'll update this before patient's discharge.  Also note a mild leukocytosis -- granulocytes were not elevated differential looked within normal limits-we'll update this of note she did get a short course of prednisone earlier in her stay with some concerns about her left knee effusion which appears to be doing better   .  Probable significant osteoporosis--she is on calcium supplementation.  Hyperlipidemia. On lovastatin, I think as primary prevention. GERD-she is on a proton pump inhibitor this has not really been an issue during her stay here.  Of note we'll update again a CBC with differential as well as metabolic panel and obtain a venous Doppler of the left lower extremity   She will be going home with family--she states there will be someone with her 24 hours a day will need continued therapy as noted above she does have a rolling walker and wheelchair at home.  [id number] note greater than 30 minutes spent on this discharge summary

## 2014-01-26 ENCOUNTER — Inpatient Hospital Stay (HOSPITAL_COMMUNITY)
Admit: 2014-01-26 | Discharge: 2014-01-26 | Disposition: A | Discharge status: Home / Self Care | Payer: Medicare Other | Attending: Internal Medicine | Admitting: Internal Medicine

## 2014-01-27 ENCOUNTER — Ambulatory Visit: Payer: Medicare Other | Admitting: Family Medicine

## 2014-01-28 ENCOUNTER — Ambulatory Visit: Payer: Medicare Other | Admitting: Family Medicine

## 2014-02-01 NOTE — Progress Notes (Signed)
   Subjective:    Patient ID: Kristy Jarvis, female    DOB: Feb 15, 1921, 80 y.o.   MRN: [id number]  HPI  Recently d/c from [facility] following hip fracture,,unable to keep appt  Review of Systems     Objective:   Physical Exam        Assessment & Plan:

## 2014-02-06 ENCOUNTER — Encounter: Payer: Self-pay | Admitting: *Deleted

## 2014-02-11 ENCOUNTER — Other Ambulatory Visit (HOSPITAL_COMMUNITY): Payer: Medicare Other

## 2014-02-16 DIAGNOSIS — Z5189 Encounter for other specified aftercare: Secondary | ICD-10-CM

## 2014-02-16 DIAGNOSIS — S7290XD Unspecified fracture of unspecified femur, subsequent encounter for closed fracture with routine healing: Secondary | ICD-10-CM

## 2014-02-16 DIAGNOSIS — Z9181 History of falling: Secondary | ICD-10-CM

## 2014-02-22 ENCOUNTER — Other Ambulatory Visit: Payer: Self-pay | Admitting: Internal Medicine

## 2014-02-24 ENCOUNTER — Telehealth: Payer: Self-pay

## 2014-02-24 NOTE — Telephone Encounter (Signed)
Patient was prescribed this upon discharge from [facility].  Please advise.

## 2014-02-24 NOTE — Telephone Encounter (Signed)
Dr Sharol Given did her surgery check with this ortho to see how long he wants her on the blood thinner for prophlaxis for DVT

## 2014-02-24 NOTE — Telephone Encounter (Signed)
pls contact Dr Aline Brochure who is prescribing it post fracture repair and let him know pt's concern so he can let pt know duration of treatment and that he will d/c when  He feels appropriate. Pls let her know that i hope she continues to get stronger, and look forward to seeing her in the office

## 2014-02-24 NOTE — Telephone Encounter (Signed)
According to Dr. Jess Barters nurse his protocol is to be on blood thinner for 30 days post surgery.

## 2014-02-24 NOTE — Telephone Encounter (Signed)
Please help.

## 2014-02-24 NOTE — Telephone Encounter (Signed)
Dr Aline Brochure did not treat this patient

## 2014-02-25 ENCOUNTER — Telehealth: Payer: Self-pay | Admitting: Family Medicine

## 2014-02-25 NOTE — Telephone Encounter (Signed)
D/c xarelto letter sent to [facility]

## 2014-02-25 NOTE — Telephone Encounter (Signed)
Patient and caregiver aware.

## 2014-03-09 ENCOUNTER — Encounter: Payer: Self-pay | Admitting: Family Medicine

## 2014-03-09 ENCOUNTER — Ambulatory Visit (INDEPENDENT_AMBULATORY_CARE_PROVIDER_SITE_OTHER): Payer: Medicare Other | Admitting: Family Medicine

## 2014-03-09 ENCOUNTER — Encounter (INDEPENDENT_AMBULATORY_CARE_PROVIDER_SITE_OTHER): Payer: Self-pay

## 2014-03-09 VITALS — BP 116/64 | HR 96 | Resp 18 | Ht 61.0 in | Wt 101.1 lb

## 2014-03-09 DIAGNOSIS — E785 Hyperlipidemia, unspecified: Secondary | ICD-10-CM

## 2014-03-09 DIAGNOSIS — D62 Acute posthemorrhagic anemia: Secondary | ICD-10-CM

## 2014-03-09 DIAGNOSIS — K2100 Gastro-esophageal reflux disease with esophagitis, without bleeding: Secondary | ICD-10-CM

## 2014-03-09 DIAGNOSIS — M810 Age-related osteoporosis without current pathological fracture: Secondary | ICD-10-CM

## 2014-03-09 DIAGNOSIS — D508 Other iron deficiency anemias: Secondary | ICD-10-CM

## 2014-03-09 DIAGNOSIS — S72001S Fracture of unspecified part of neck of right femur, sequela: Secondary | ICD-10-CM

## 2014-03-09 DIAGNOSIS — Z23 Encounter for immunization: Secondary | ICD-10-CM | POA: Insufficient documentation

## 2014-03-09 NOTE — Assessment & Plan Note (Signed)
Has large hiatal hernia , and has hjad stricture, needs daily PPI controlled on this

## 2014-03-09 NOTE — Patient Instructions (Addendum)
Annual wellness in 4.5 month, call if you need me before  Fasting lipid, cmp and eGFr, CBC , iron and ferritin and vit D first week in November  I will arrange for you to get once yearly bone builder in your vein since you have had a fracture   Letter today requesting that you get special backyard collection/equest form signed and returned  Flu vaccine today  Careful not to fall , use walker or cane to help prevent another fall

## 2014-03-09 NOTE — Assessment & Plan Note (Signed)
rept cbc , and iron and ferritin in 2 weeks

## 2014-03-09 NOTE — Assessment & Plan Note (Signed)
Vaccine administered at visit.  

## 2014-03-09 NOTE — Assessment & Plan Note (Signed)
Needs yearly IV bisphosphonate due to high fracture risk neded to f/u on this asap, not a candidate for oral med

## 2014-03-09 NOTE — Assessment & Plan Note (Signed)
Controlled, no change in medication Hyperlipidemia:Low fat diet discussed and encouraged.  Updated lab needed at/ before next visit.  

## 2014-03-09 NOTE — Progress Notes (Signed)
   Subjective:    Patient ID: Kristy Jarvis, female    DOB: 09-Mar-1921, 80 y.o.   MRN: [id number]  HPI The PT is here for follow up and re-evaluation of chronic medical conditions, medication management and review of any available recent lab and radiology data.  Preventive health is updated, specifically  Cancer screening and Immunization.   First visit since left hip fracture , ORIF , SNF placeme nt and just completed in home pT. At times still unsteady, uses her walker The PT denies any adverse reactions to current medications since the last visit.  There are no new concerns.  There are no specific complaints       Review of Systems See HPI Denies recent fever or chills. Denies sinus pressure, nasal congestion, ear pain or sore throat. Denies chest congestion, productive cough or wheezing. Denies chest pains, palpitations and leg swelling Denies abdominal pain, nausea, vomiting,diarrhea or constipation.   Denies dysuria, frequency, hesitancy or incontinence. Denies headaches, seizures, numbness, or tingling. Denies depression, anxiety or insomnia. Denies skin break down or rash.        Objective:   Physical Exam BP 116/64  Pulse 96  Resp 18  Ht 5\' 1"  (1.549 m)  Wt 101 lb 1.9 oz (45.868 kg)  BMI 19.12 kg/m2  SpO2 98%  Patient alert and oriented and in no cardiopulmonary distress.  HEENT: No facial asymmetry, EOMI,   oropharynx pink and moist.  Neck supple no JVD, no mass.  Chest: Clear to auscultation bilaterally.  CVS: S1, S2 no murmurs, no S3.Regular rate.  ABD: Soft non tender.   Ext: No edema  MS: Adequate though reduced  ROM spine, shoulders, hips and knees.  Skin: Intact, no ulcerations or rash noted.Left hip  surgical scar very well healed o  Psych: Good eye contact, normal affect. Memory intact not anxious or depressed appearing.  CNS: CN 2-12 intact, power,  normal throughout.no focal deficits noted.        Assessment & Plan:  Hip  fracture S/p short hospitalization, SNF placement short term and now at home and on her own since past weekend Has life alert in case of a fall, uses a walker most of the time, but also walks without it at home at times, holding on to the walls Denies pain  GERD Has large hiatal hernia , and has hjad stricture, needs daily PPI controlled on this  Osteoporosis Needs yearly IV bisphosphonate due to high fracture risk neded to f/u on this asap, not a candidate for oral med  Hyperlipemia Controlled, no change in medication Hyperlipidemia:Low fat diet discussed and encouraged.  Updated lab needed at/ before next visit.   Postoperative anemia due to acute blood loss rept cbc , and iron and ferritin in 2 weeks  Need for prophylactic vaccination and inoculation against influenza Vaccine administered at visit.

## 2014-03-09 NOTE — Assessment & Plan Note (Signed)
S/p short hospitalization, SNF placement short term and now at home and on her own since past weekend Has life alert in case of a fall, uses a walker most of the time, but also walks without it at home at times, holding on to the walls Denies pain

## 2014-03-10 ENCOUNTER — Telehealth: Payer: Self-pay | Admitting: Family Medicine

## 2014-03-10 DIAGNOSIS — M810 Age-related osteoporosis without current pathological fracture: Secondary | ICD-10-CM

## 2014-03-10 NOTE — Telephone Encounter (Signed)
Daughter aware and will have her mother get labs done next week

## 2014-03-10 NOTE — Telephone Encounter (Signed)
Pt needs serum creatinine, calcium creatinine  And EGFR done next week Thursday or Friday in preparation for reclast Also remind her she needs to take calcium 1200 mg ttotal per day and vit D over 400 IU  Per day  So her bones will get stringer (Aim for 800iu of D3)

## 2014-03-10 NOTE — Addendum Note (Signed)
Addended by: Eual Fines on: 03/10/2014 09:48 AM   Modules accepted: Orders

## 2014-03-18 ENCOUNTER — Telehealth: Payer: Self-pay | Admitting: *Deleted

## 2014-03-18 NOTE — Telephone Encounter (Signed)
Pt's daughter came into the office requesting pt's labs to be done all in one if possible per Daughter pt is having non fasting labs tomorrow and fasting labs next week. So pt is not being stuck twice, the daughter wants to know if the labs can be done all in one day. Please advise [phone]

## 2014-03-18 NOTE — Telephone Encounter (Signed)
Aware that both labs can be done together as long as she is fasting

## 2014-03-19 LAB — CBC
HCT: 44.1 % (ref 36.0–46.0)
Hemoglobin: 14.8 g/dL (ref 12.0–15.0)
MCH: 30 pg (ref 26.0–34.0)
MCHC: 33.6 g/dL (ref 30.0–36.0)
MCV: 89.3 fL (ref 78.0–100.0)
Platelets: 274 10*3/uL (ref 150–400)
RBC: 4.94 MIL/uL (ref 3.87–5.11)
RDW: 14 % (ref 11.5–15.5)
WBC: 7.5 10*3/uL (ref 4.0–10.5)

## 2014-03-19 LAB — LIPID PANEL
CHOL/HDL RATIO: 2.8 ratio
Cholesterol: 158 mg/dL (ref 0–200)
HDL: 57 mg/dL (ref >39)
LDL Cholesterol: 68 mg/dL (ref 0–99)
Triglycerides: 164 mg/dL — ABNORMAL HIGH (ref <150)
VLDL: 33 mg/dL (ref 0–40)

## 2014-03-19 LAB — COMPLETE METABOLIC PANEL WITH GFR
ALBUMIN: 3.8 g/dL (ref 3.5–5.2)
ALK PHOS: 106 U/L (ref 39–117)
ALT: 9 U/L (ref 0–35)
AST: 18 U/L (ref 0–37)
BUN: 10 mg/dL (ref 6–23)
CO2: 29 mEq/L (ref 19–32)
Calcium: 9.4 mg/dL (ref 8.4–10.5)
Chloride: 106 mEq/L (ref 96–112)
Creat: 0.87 mg/dL (ref 0.50–1.10)
GFR, Est African American: 68 mL/min
GFR, Est Non African American: 59 mL/min — ABNORMAL LOW
Glucose, Bld: 93 mg/dL (ref 70–99)
Potassium: 4.1 mEq/L (ref 3.5–5.3)
Sodium: 144 mEq/L (ref 135–145)
TOTAL PROTEIN: 6.1 g/dL (ref 6.0–8.3)
Total Bilirubin: 0.5 mg/dL (ref 0.2–1.2)

## 2014-03-19 LAB — IRON: Iron: 112 ug/dL (ref 42–145)

## 2014-03-19 LAB — FERRITIN: FERRITIN: 70 ng/mL (ref 10–291)

## 2014-03-20 LAB — VITAMIN D 25 HYDROXY (VIT D DEFICIENCY, FRACTURES): Vit D, 25-Hydroxy: 36 ng/mL (ref 30–89)

## 2014-04-02 ENCOUNTER — Encounter (HOSPITAL_COMMUNITY)
Admission: RE | Admit: 2014-04-02 | Discharge: 2014-04-02 | Disposition: A | Discharge status: Home / Self Care | Payer: Medicare Other | Source: Ambulatory Visit | Attending: Family Medicine | Admitting: Family Medicine

## 2014-04-02 ENCOUNTER — Encounter (HOSPITAL_COMMUNITY): Payer: Self-pay

## 2014-04-02 DIAGNOSIS — S72009A Fracture of unspecified part of neck of unspecified femur, initial encounter for closed fracture: Secondary | ICD-10-CM | POA: Insufficient documentation

## 2014-04-02 DIAGNOSIS — M810 Age-related osteoporosis without current pathological fracture: Secondary | ICD-10-CM | POA: Diagnosis not present

## 2014-04-02 MED ORDER — SODIUM CHLORIDE 0.9 % IV SOLN
Freq: Once | INTRAVENOUS | Status: AC
  Administered 2014-04-02: 250 mL via INTRAVENOUS

## 2014-04-02 MED ORDER — ZOLEDRONIC ACID 5 MG/100ML IV SOLN
5.0000 mg | Freq: Once | INTRAVENOUS | Status: AC
  Administered 2014-04-02: 5 mg via INTRAVENOUS
  Filled 2014-04-02: qty 100

## 2014-04-02 NOTE — Discharge Instructions (Signed)

## 2014-04-11 ENCOUNTER — Other Ambulatory Visit: Payer: Self-pay | Admitting: Internal Medicine

## 2014-04-14 ENCOUNTER — Other Ambulatory Visit: Payer: Self-pay | Admitting: Internal Medicine

## 2014-04-19 ENCOUNTER — Other Ambulatory Visit: Payer: Self-pay | Admitting: Internal Medicine

## 2014-04-21 ENCOUNTER — Other Ambulatory Visit: Payer: Self-pay | Admitting: Internal Medicine

## 2014-04-22 ENCOUNTER — Other Ambulatory Visit: Payer: Self-pay | Admitting: Family Medicine

## 2014-05-31 ENCOUNTER — Other Ambulatory Visit: Payer: Self-pay | Admitting: Family Medicine

## 2014-06-08 ENCOUNTER — Other Ambulatory Visit: Payer: Self-pay | Admitting: Family Medicine

## 2014-07-08 ENCOUNTER — Telehealth: Payer: Self-pay | Admitting: Family Medicine

## 2014-07-10 NOTE — Telephone Encounter (Signed)
Spoke with Rodena Piety and she is concerned that patient is having a change in dementia progression.  She is forgetting things more often and she wants to know if she can be checked and prescribed something to help stop this progression.  Mini mental will be done at office visit.

## 2014-07-13 ENCOUNTER — Ambulatory Visit (INDEPENDENT_AMBULATORY_CARE_PROVIDER_SITE_OTHER): Payer: Medicare Other | Admitting: Family Medicine

## 2014-07-13 ENCOUNTER — Encounter: Payer: Self-pay | Admitting: Family Medicine

## 2014-07-13 VITALS — BP 98/62 | HR 70 | Resp 18 | Ht 61.0 in | Wt 100.0 lb

## 2014-07-13 DIAGNOSIS — M810 Age-related osteoporosis without current pathological fracture: Secondary | ICD-10-CM

## 2014-07-13 DIAGNOSIS — R413 Other amnesia: Secondary | ICD-10-CM

## 2014-07-13 DIAGNOSIS — E559 Vitamin D deficiency, unspecified: Secondary | ICD-10-CM | POA: Diagnosis not present

## 2014-07-13 DIAGNOSIS — K219 Gastro-esophageal reflux disease without esophagitis: Secondary | ICD-10-CM

## 2014-07-13 DIAGNOSIS — Z23 Encounter for immunization: Secondary | ICD-10-CM

## 2014-07-13 DIAGNOSIS — D539 Nutritional anemia, unspecified: Secondary | ICD-10-CM | POA: Diagnosis not present

## 2014-07-13 DIAGNOSIS — E785 Hyperlipidemia, unspecified: Secondary | ICD-10-CM

## 2014-07-13 DIAGNOSIS — Z0000 Encounter for general adult medical examination without abnormal findings: Secondary | ICD-10-CM | POA: Insufficient documentation

## 2014-07-13 DIAGNOSIS — Z1231 Encounter for screening mammogram for malignant neoplasm of breast: Secondary | ICD-10-CM

## 2014-07-13 DIAGNOSIS — R5383 Other fatigue: Secondary | ICD-10-CM

## 2014-07-13 DIAGNOSIS — G3184 Mild cognitive impairment, so stated: Secondary | ICD-10-CM

## 2014-07-13 MED ORDER — DONEPEZIL HCL 5 MG PO TABS: 5.0000 mg | ORAL_TABLET | Freq: Every day | ORAL | Status: DC

## 2014-07-13 NOTE — Progress Notes (Signed)
   Subjective:    HPI The PT is here for follow up and re-evaluation of chronic medical conditions, medication management and review of any available recent lab and radiology data.  Preventive health is updated, specifically  Cancer screening and Immunization.    The PT denies any adverse reactions to current medications since the last visit.  T  Daughter and pt are concerned that she has decompensated since hospitalization is concerned, messing up her checks and medication, formal MMSE at visit is good but based on behavioral change will initiate aricept. Ptential adverse s/e are reviewed and pt advised to d/c med should they occur    Review of Systems See HPI Denies recent fever or chills. Denies sinus pressure, nasal congestion, ear pain or sore throat. Denies chest congestion, productive cough or wheezing. Denies chest pains, palpitations and leg swelling Denies abdominal pain, nausea, vomiting,diarrhea or constipation.   Denies dysuria, frequency, hesitancy or incontinence. Denies joint pain, swelling and she does have  limitation in mobility.Needs a cane, s/p hip replacement Denies headaches, seizures, numbness, or tingling. Denies depression, anxiety or insomnia. Denies skin break down or rash.        Objective:   Physical Exam  BP 98/62 mmHg  Pulse 70  Resp 18  Ht 5\' 1"  (1.549 m)  Wt 100 lb (45.36 kg)  BMI 18.90 kg/m2  SpO2 100%  Patient alert and oriented and in no cardiopulmonary distress.  HEENT: No facial asymmetry, EOMI,   oropharynx pink and moist.  Neck supple no JVD, no mass.  Chest: Clear to auscultation bilaterally.  CVS: S1, S2 no murmurs, no S3.Regular rate.  ABD: Soft non tender.   Ext: No edema  MS: Adequate ROM spine, shoulders,  and knees.Reduced in hip an dambulates with a cane  Skin: Intact, no ulcerations or rash noted.  Psych: Good eye contact, normal affect. Memory intact not anxious or depressed appearing.  CNS: CN 2-12 intact,  power,  normal throughout.no focal deficits noted.        Assessment & Plan:  Medicare annual wellness visit, subsequent Annual exam as documented. Counseling done  re healthy lifestyle involving commitment to 150 minutes exercise per week, heart healthy diet, and attaining healthy weight.The importance of adequate sleep also discussed. Regular seat belt use and home safety, is also discussed. Changes in health habits are decided on by the patient with goals and time frames  set for achieving them. Immunization and cancer screening needs are specifically addressed at this visit.    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 Deterioration in performance noted following recent hospitalization then SNF stay for rehab following ORIF of hip   Hyperlipemia Elevated TG Hyperlipidemia:Low fat diet discussed and encouraged.  Updated lab needed at/ before next visit.    Osteoporosis Has had reclast in 2015  and is maintained on calcium and vit D   GERD Controlled, no change in medication

## 2014-07-13 NOTE — Assessment & Plan Note (Signed)

## 2014-07-13 NOTE — Patient Instructions (Signed)
Annual wellness in 2.5 month, call if you need me before  Your memory test is excellent 29/30, HOWEVER, since you do realize that you have started forgetting some things, I feel that a trial of aricept 5 mg one every evening is worth it.  The most common bad side effect people complain of is stomach upset, some have mild confusion, if the medication bothers you, stop  It, and  Call and let us know.  Prevnar today  Labs today due to fatigue  Mammogram will be scheduled for end March when due  Keep involved in family and community as you are , this will help you to continue enjoying and living life to the fullest, the best you are able  Pls start arranging your medications every week in a pill box

## 2014-07-14 LAB — CBC
HCT: 45.2 % (ref 36.0–46.0)
Hemoglobin: 14.5 g/dL (ref 12.0–15.0)
MCH: 28.7 pg (ref 26.0–34.0)
MCHC: 32.1 g/dL (ref 30.0–36.0)
MCV: 89.3 fL (ref 78.0–100.0)
MPV: 11.4 fL (ref 8.6–12.4)
Platelets: 271 10*3/uL (ref 150–400)
RBC: 5.06 MIL/uL (ref 3.87–5.11)
RDW: 15.3 % (ref 11.5–15.5)
WBC: 8.3 10*3/uL (ref 4.0–10.5)

## 2014-07-14 LAB — TSH: TSH: 0.584 u[IU]/mL (ref 0.350–4.500)

## 2014-07-14 LAB — BASIC METABOLIC PANEL
BUN: 19 mg/dL (ref 6–23)
CO2: 25 meq/L (ref 19–32)
Calcium: 9.7 mg/dL (ref 8.4–10.5)
Chloride: 103 mEq/L (ref 96–112)
Creat: 0.89 mg/dL (ref 0.50–1.10)
GLUCOSE: 88 mg/dL (ref 70–99)
Potassium: 4.4 mEq/L (ref 3.5–5.3)
Sodium: 140 mEq/L (ref 135–145)

## 2014-07-14 LAB — VITAMIN D 25 HYDROXY (VIT D DEFICIENCY, FRACTURES): Vit D, 25-Hydroxy: 18 ng/mL — ABNORMAL LOW (ref 30–100)

## 2014-07-14 LAB — VITAMIN B12: Vitamin B-12: 394 pg/mL (ref 211–911)

## 2014-07-16 MED ORDER — VITAMIN D (ERGOCALCIFEROL) 1.25 MG (50000 UNIT) PO CAPS: 50000.0000 [IU] | ORAL_CAPSULE | ORAL | Status: DC

## 2014-07-17 NOTE — Assessment & Plan Note (Signed)
Controlled, no change in medication  

## 2014-07-17 NOTE — Assessment & Plan Note (Signed)
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 Deterioration in performance noted following recent hospitalization then SNF stay for rehab following ORIF of hip

## 2014-07-17 NOTE — Assessment & Plan Note (Signed)
Elevated TG Hyperlipidemia:Low fat diet discussed and encouraged.  Updated lab needed at/ before next visit.  

## 2014-07-17 NOTE — Assessment & Plan Note (Signed)
Has had reclast in 2015  and is maintained on calcium and vit D

## 2014-08-14 ENCOUNTER — Ambulatory Visit (HOSPITAL_COMMUNITY)
Admission: RE | Admit: 2014-08-14 | Discharge: 2014-08-14 | Disposition: A | Discharge status: Home / Self Care | Payer: Medicare Other | Source: Ambulatory Visit | Attending: Family Medicine | Admitting: Family Medicine

## 2014-08-14 DIAGNOSIS — Z1231 Encounter for screening mammogram for malignant neoplasm of breast: Secondary | ICD-10-CM | POA: Insufficient documentation

## 2014-08-28 ENCOUNTER — Other Ambulatory Visit: Payer: Self-pay | Admitting: Family Medicine

## 2014-11-02 ENCOUNTER — Other Ambulatory Visit: Payer: Self-pay | Admitting: Family Medicine

## 2014-12-03 ENCOUNTER — Ambulatory Visit (INDEPENDENT_AMBULATORY_CARE_PROVIDER_SITE_OTHER): Payer: Medicare Other | Admitting: Family Medicine

## 2014-12-03 ENCOUNTER — Encounter: Payer: Self-pay | Admitting: Family Medicine

## 2014-12-03 VITALS — BP 128/72 | HR 74 | Resp 16 | Ht 60.0 in | Wt 103.0 lb

## 2014-12-03 DIAGNOSIS — Z0000 Encounter for general adult medical examination without abnormal findings: Secondary | ICD-10-CM | POA: Insufficient documentation

## 2014-12-03 DIAGNOSIS — E559 Vitamin D deficiency, unspecified: Secondary | ICD-10-CM

## 2014-12-03 DIAGNOSIS — E785 Hyperlipidemia, unspecified: Secondary | ICD-10-CM

## 2014-12-03 NOTE — Assessment & Plan Note (Addendum)
Annual exam as documented. Counseling done  re healthy lifestyle involving commitment to 150 minutes exercise per week, heart healthy diet, and attaining healthy weight.The importance of adequate sleep also discussed. Regular seat belt use and home safety, is also discussed. Changes in health habits are decided on by the patient  To include more reading, word search puzzles and use of her brain  To help memory and cognition. Immunization and cancer screening needs are specifically addressed at this visit.

## 2014-12-03 NOTE — Patient Instructions (Signed)
F/u early Decmber with rectal, call if you need me beforw   Keep doing what you are doing, please start things like word searches, jig saw puzzles etc to help brain  Fasting lipid, hepatic and Vit D this week please  Thanks for choosing [facility], we consider it a privelige to serve you. Fall Prevention and Home Safety Falls cause injuries and can affect all age groups. It is possible to prevent falls.  HOW TO PREVENT FALLS  Wear shoes with rubber soles that do not have an opening for your toes.  Keep the inside and outside of your house well lit.  Use night lights throughout your home.  Remove clutter from floors.  Clean up floor spills.  Remove throw rugs or fasten them to the floor with carpet tape.  Do not place electrical cords across pathways.  Put grab bars by your tub, shower, and toilet. Do not use towel bars as grab bars.  Put handrails on both sides of the stairway. Fix loose handrails.  Do not climb on stools or stepladders, if possible.  Do not wax your floors.  Repair uneven or unsafe sidewalks, walkways, or stairs.  Keep items you use a lot within reach.  Be aware of pets.  Keep emergency numbers next to the telephone.  Put smoke detectors in your home and near bedrooms. Ask your doctor what other things you can do to prevent falls. Document Released: 03/04/2009 Document Revised: 11/07/2011 Document Reviewed: 08/08/2011 [facility] Patient Information 2015 [place], [place]. This information is not intended to replace advice given to you by your health care provider. Make sure you discuss any questions you have with your health care provider.

## 2014-12-03 NOTE — Progress Notes (Signed)
Subjective:    Patient ID: Kristy Jarvis, female    DOB: 1921-02-20, 81 y.o.   MRN: [id number]  HPI Preventive Screening-Counseling & Management   Patient present here today for a Medicare annual wellness visit.   Current Problems (verified)   Medications Prior to Visit Allergies (verified)   PAST HISTORY  Family History (verified)   Social History Widowed, 1 child, lives alone. Never smoker   Risk Factors  Current exercise habits:  Walks some daily when its not too hot (up and down her driveway)   Dietary issues discussed: Eats right many fruits and vegetables, tries to limit fried foods   Cardiac risk factors:   Depression Screen  (Note: if answer to either of the following is "Yes", a more complete depression screening is indicated)   Over the past two weeks, have you felt down, depressed or hopeless? No  Over the past two weeks, have you felt little interest or pleasure in doing things? No  Have you lost interest or pleasure in daily life? No  Do you often feel hopeless? No  Do you cry easily over simple problems? No   Activities of Daily Living  In your present state of health, do you have any difficulty performing the following activities?  Driving?: No Managing money?: daughter manages money  Feeding yourself?:No Getting from bed to chair?:No Climbing a flight of stairs?: not many  Preparing food and eating?:No Bathing or showering?:No Getting dressed?:No Getting to the toilet?:No Using the toilet?:No Moving around from place to place?: uses claw cane when she is out places   Fall Risk Assessment In the past year have you fallen or had a near fall?:once  Are you currently taking any medications that make you dizzy?:No   Hearing Difficulties: No Do you often ask people to speak up or repeat themselves?:No Do you experience ringing or noises in your ears?:No Do you have difficulty understanding soft or whispered voices?:No  Cognitive Testing    Alert? Yes Normal Appearance?Yes  Oriented to person? Yes Place? Yes  Time? Yes  Displays appropriate judgment?Yes  Can read the correct time from a watch face? yes Are you having problems remembering things?No  Advanced Directives have been discussed with the patient?Yes, patient has living will    List the Names of Other Physician/Practitioners you currently use:  None   Indicate any recent Medical Services you may have received from other than [facility] providers in the past year (date may be approximate).   Assessment:    Annual Wellness Exam   Plan:    .  Medicare Attestation  I have personally reviewed:  The patient's medical and social history  Their use of alcohol, tobacco or illicit drugs  Their current medications and supplements  The patient's functional ability including ADLs,fall risks, home safety risks, cognitive, and hearing and visual impairment  Diet and physical activities  Evidence for depression or mood disorders  The patient's weight, height, BMI, and visual acuity have been recorded in the chart. I have made referrals, counseling, and provided education to the patient based on review of the above and I have provided the patient with a written personalized care plan for preventive services.      Review of Systems     Objective:   Physical Exam BP 128/72 mmHg  Pulse 74  Resp 16  Ht 5' (1.524 m)  Wt 103 lb (46.72 kg)  BMI 20.12 kg/m2  SpO2 97%        Assessment &  Plan:  Medicare annual wellness visit, subsequent Annual exam as documented. Counseling done  re healthy lifestyle involving commitment to 150 minutes exercise per week, heart healthy diet, and attaining healthy weight.The importance of adequate sleep also discussed. Regular seat belt use and home safety, is also discussed. Changes in health habits are decided on by the patient  To include more reading, word search puzzles and use of her brain  To help memory and  cognition. Immunization and cancer screening needs are specifically addressed at this visit.

## 2014-12-05 ENCOUNTER — Encounter: Payer: Self-pay | Admitting: Family Medicine

## 2014-12-08 DIAGNOSIS — E785 Hyperlipidemia, unspecified: Secondary | ICD-10-CM | POA: Diagnosis not present

## 2014-12-08 DIAGNOSIS — E559 Vitamin D deficiency, unspecified: Secondary | ICD-10-CM | POA: Diagnosis not present

## 2014-12-08 LAB — HEPATIC FUNCTION PANEL
ALT: 10 U/L (ref 0–35)
AST: 22 U/L (ref 0–37)
Albumin: 3.7 g/dL (ref 3.5–5.2)
Alkaline Phosphatase: 67 U/L (ref 39–117)
BILIRUBIN INDIRECT: 0.7 mg/dL (ref 0.2–1.2)
Bilirubin, Direct: 0.1 mg/dL (ref 0.0–0.3)
TOTAL PROTEIN: 6.5 g/dL (ref 6.0–8.3)
Total Bilirubin: 0.8 mg/dL (ref 0.2–1.2)

## 2014-12-08 LAB — LIPID PANEL
CHOLESTEROL: 174 mg/dL (ref 0–200)
HDL: 75 mg/dL (ref >=46)
LDL CALC: 78 mg/dL (ref 0–99)
TRIGLYCERIDES: 106 mg/dL (ref <150)
Total CHOL/HDL Ratio: 2.3 Ratio
VLDL: 21 mg/dL (ref 0–40)

## 2014-12-09 LAB — VITAMIN D 25 HYDROXY (VIT D DEFICIENCY, FRACTURES): Vit D, 25-Hydroxy: 17 ng/mL — ABNORMAL LOW (ref 30–100)

## 2014-12-15 ENCOUNTER — Other Ambulatory Visit: Payer: Self-pay | Admitting: Family Medicine

## 2014-12-16 ENCOUNTER — Other Ambulatory Visit: Payer: Self-pay

## 2014-12-16 MED ORDER — VITAMIN D (ERGOCALCIFEROL) 1.25 MG (50000 UNIT) PO CAPS: 50000.0000 [IU] | ORAL_CAPSULE | ORAL | Status: DC

## 2014-12-31 ENCOUNTER — Other Ambulatory Visit: Payer: Self-pay | Admitting: Family Medicine

## 2015-02-22 ENCOUNTER — Encounter (HOSPITAL_COMMUNITY): Payer: Self-pay | Admitting: Emergency Medicine

## 2015-02-22 ENCOUNTER — Emergency Department (HOSPITAL_COMMUNITY): Payer: Medicare Other

## 2015-02-22 ENCOUNTER — Observation Stay (HOSPITAL_COMMUNITY)
Admission: EM | Admit: 2015-02-22 | Discharge: 2015-02-23 | Disposition: A | Discharge status: Home / Self Care | Payer: Medicare Other | Attending: Internal Medicine | Admitting: Internal Medicine

## 2015-02-22 DIAGNOSIS — N23 Unspecified renal colic: Secondary | ICD-10-CM | POA: Insufficient documentation

## 2015-02-22 DIAGNOSIS — N132 Hydronephrosis with renal and ureteral calculous obstruction: Secondary | ICD-10-CM | POA: Diagnosis not present

## 2015-02-22 DIAGNOSIS — N1330 Unspecified hydronephrosis: Secondary | ICD-10-CM

## 2015-02-22 DIAGNOSIS — Z79899 Other long term (current) drug therapy: Secondary | ICD-10-CM | POA: Diagnosis not present

## 2015-02-22 DIAGNOSIS — E860 Dehydration: Secondary | ICD-10-CM | POA: Insufficient documentation

## 2015-02-22 DIAGNOSIS — N200 Calculus of kidney: Secondary | ICD-10-CM | POA: Diagnosis not present

## 2015-02-22 DIAGNOSIS — N201 Calculus of ureter: Secondary | ICD-10-CM

## 2015-02-22 DIAGNOSIS — K219 Gastro-esophageal reflux disease without esophagitis: Secondary | ICD-10-CM | POA: Diagnosis not present

## 2015-02-22 DIAGNOSIS — R109 Unspecified abdominal pain: Secondary | ICD-10-CM | POA: Diagnosis not present

## 2015-02-22 DIAGNOSIS — Z7982 Long term (current) use of aspirin: Secondary | ICD-10-CM | POA: Diagnosis not present

## 2015-02-22 DIAGNOSIS — E785 Hyperlipidemia, unspecified: Secondary | ICD-10-CM | POA: Insufficient documentation

## 2015-02-22 DIAGNOSIS — K5730 Diverticulosis of large intestine without perforation or abscess without bleeding: Secondary | ICD-10-CM | POA: Diagnosis not present

## 2015-02-22 DIAGNOSIS — N202 Calculus of kidney with calculus of ureter: Secondary | ICD-10-CM | POA: Diagnosis not present

## 2015-02-22 DIAGNOSIS — M810 Age-related osteoporosis without current pathological fracture: Secondary | ICD-10-CM | POA: Diagnosis not present

## 2015-02-22 DIAGNOSIS — G3184 Mild cognitive impairment, so stated: Secondary | ICD-10-CM | POA: Diagnosis present

## 2015-02-22 LAB — COMPREHENSIVE METABOLIC PANEL
ALT: 14 U/L (ref 14–54)
ANION GAP: 10 (ref 5–15)
AST: 29 U/L (ref 15–41)
Albumin: 4.1 g/dL (ref 3.5–5.0)
Alkaline Phosphatase: 69 U/L (ref 38–126)
BILIRUBIN TOTAL: 0.5 mg/dL (ref 0.3–1.2)
BUN: 19 mg/dL (ref 6–20)
CHLORIDE: 106 mmol/L (ref 101–111)
CO2: 26 mmol/L (ref 22–32)
Calcium: 9.4 mg/dL (ref 8.9–10.3)
Creatinine, Ser: 1.14 mg/dL — ABNORMAL HIGH (ref 0.44–1.00)
GFR, EST AFRICAN AMERICAN: 47 mL/min — AB (ref >60)
GFR, EST NON AFRICAN AMERICAN: 41 mL/min — AB (ref >60)
Glucose, Bld: 145 mg/dL — ABNORMAL HIGH (ref 65–99)
POTASSIUM: 3.4 mmol/L — AB (ref 3.5–5.1)
Sodium: 142 mmol/L (ref 135–145)
TOTAL PROTEIN: 6.9 g/dL (ref 6.5–8.1)

## 2015-02-22 LAB — URINALYSIS, ROUTINE W REFLEX MICROSCOPIC
BILIRUBIN URINE: NEGATIVE
Glucose, UA: NEGATIVE mg/dL
Hgb urine dipstick: NEGATIVE
KETONES UR: 15 mg/dL — AB
LEUKOCYTES UA: NEGATIVE
NITRITE: NEGATIVE
PROTEIN: NEGATIVE mg/dL
Specific Gravity, Urine: 1.02 (ref 1.005–1.030)
Urobilinogen, UA: 0.2 mg/dL (ref 0.0–1.0)
pH: 6.5 (ref 5.0–8.0)

## 2015-02-22 LAB — CBC WITH DIFFERENTIAL/PLATELET
Basophils Absolute: 0.1 10*3/uL (ref 0.0–0.1)
Basophils Relative: 1 %
EOS ABS: 0.2 10*3/uL (ref 0.0–0.7)
EOS PCT: 1 %
HCT: 43.2 % (ref 36.0–46.0)
HEMOGLOBIN: 14.3 g/dL (ref 12.0–15.0)
Lymphocytes Relative: 45 %
Lymphs Abs: 5.5 10*3/uL — ABNORMAL HIGH (ref 0.7–4.0)
MCH: 30.4 pg (ref 26.0–34.0)
MCHC: 33.1 g/dL (ref 30.0–36.0)
MCV: 91.9 fL (ref 78.0–100.0)
MONO ABS: 0.9 10*3/uL (ref 0.1–1.0)
MONOS PCT: 7 %
NEUTROS PCT: 46 %
Neutro Abs: 5.6 10*3/uL (ref 1.7–7.7)
PLATELETS: 253 10*3/uL (ref 150–400)
RBC: 4.7 MIL/uL (ref 3.87–5.11)
RDW: 13.8 % (ref 11.5–15.5)
WBC: 12.1 10*3/uL — ABNORMAL HIGH (ref 4.0–10.5)

## 2015-02-22 LAB — LIPASE, BLOOD: LIPASE: 33 U/L (ref 22–51)

## 2015-02-22 LAB — CBG MONITORING, ED: Glucose-Capillary: 96 mg/dL (ref 65–99)

## 2015-02-22 LAB — I-STAT CG4 LACTIC ACID, ED: LACTIC ACID, VENOUS: 3.09 mmol/L — AB (ref 0.5–2.0)

## 2015-02-22 MED ORDER — ASPIRIN 81 MG PO CHEW
81.0000 mg | CHEWABLE_TABLET | Freq: Every day | ORAL | Status: DC
  Administered 2015-02-23: 81 mg via ORAL
  Filled 2015-02-22: qty 1

## 2015-02-22 MED ORDER — MORPHINE SULFATE (PF) 2 MG/ML IV SOLN: 2.0000 mg | INTRAVENOUS | Status: DC | PRN

## 2015-02-22 MED ORDER — PANTOPRAZOLE SODIUM 40 MG PO TBEC
40.0000 mg | DELAYED_RELEASE_TABLET | Freq: Every day | ORAL | Status: DC
  Administered 2015-02-23: 40 mg via ORAL
  Filled 2015-02-22: qty 1

## 2015-02-22 MED ORDER — ONDANSETRON HCL 4 MG/2ML IJ SOLN
4.0000 mg | Freq: Once | INTRAMUSCULAR | Status: AC
  Administered 2015-02-22: 4 mg via INTRAVENOUS
  Filled 2015-02-22: qty 2

## 2015-02-22 MED ORDER — VITAMIN D (ERGOCALCIFEROL) 1.25 MG (50000 UNIT) PO CAPS
50000.0000 [IU] | ORAL_CAPSULE | ORAL | Status: DC
  Administered 2015-02-23: 50000 [IU] via ORAL
  Filled 2015-02-22: qty 1

## 2015-02-22 MED ORDER — CALCIUM CARBONATE ANTACID 500 MG PO CHEW
2.0000 | CHEWABLE_TABLET | Freq: Every day | ORAL | Status: DC
  Administered 2015-02-23: 400 mg via ORAL
  Filled 2015-02-22: qty 2

## 2015-02-22 MED ORDER — SODIUM CHLORIDE 0.9 % IV BOLUS (SEPSIS)
1000.0000 mL | Freq: Once | INTRAVENOUS | Status: AC
  Administered 2015-02-22: 1000 mL via INTRAVENOUS

## 2015-02-22 MED ORDER — ENOXAPARIN SODIUM 30 MG/0.3ML ~~LOC~~ SOLN: 30.0000 mg | SUBCUTANEOUS | Status: DC

## 2015-02-22 MED ORDER — FENTANYL CITRATE (PF) 100 MCG/2ML IJ SOLN
25.0000 ug | Freq: Once | INTRAMUSCULAR | Status: AC
  Administered 2015-02-22: 25 ug via INTRAVENOUS
  Filled 2015-02-22: qty 2

## 2015-02-22 MED ORDER — SODIUM CHLORIDE 0.9 % IV SOLN
INTRAVENOUS | Status: DC
  Administered 2015-02-22: 23:00:00 via INTRAVENOUS

## 2015-02-22 MED ORDER — ACETAMINOPHEN 500 MG PO TABS: 500.0000 mg | ORAL_TABLET | Freq: Four times a day (QID) | ORAL | Status: DC | PRN

## 2015-02-22 MED ORDER — TAMSULOSIN HCL 0.4 MG PO CAPS: 0.4000 mg | ORAL_CAPSULE | Freq: Every day | ORAL | Status: DC

## 2015-02-22 MED ORDER — ONDANSETRON HCL 4 MG PO TABS: 4.0000 mg | ORAL_TABLET | Freq: Four times a day (QID) | ORAL | Status: DC | PRN

## 2015-02-22 MED ORDER — FENTANYL CITRATE (PF) 100 MCG/2ML IJ SOLN
25.0000 ug | INTRAMUSCULAR | Status: DC | PRN
  Administered 2015-02-22 (×2): 25 ug via INTRAVENOUS
  Filled 2015-02-22 (×2): qty 2

## 2015-02-22 MED ORDER — PRAVASTATIN SODIUM 40 MG PO TABS: 40.0000 mg | ORAL_TABLET | Freq: Every day | ORAL | Status: DC

## 2015-02-22 MED ORDER — ONDANSETRON HCL 4 MG/2ML IJ SOLN: 4.0000 mg | Freq: Four times a day (QID) | INTRAMUSCULAR | Status: DC | PRN

## 2015-02-22 MED ORDER — ONDANSETRON HCL 4 MG/2ML IJ SOLN
4.0000 mg | INTRAMUSCULAR | Status: AC | PRN
  Administered 2015-02-22 (×2): 4 mg via INTRAVENOUS
  Filled 2015-02-22 (×2): qty 2

## 2015-02-22 MED ORDER — ENOXAPARIN SODIUM 40 MG/0.4ML ~~LOC~~ SOLN
40.0000 mg | SUBCUTANEOUS | Status: DC
Start: 2015-02-22 — End: 2015-02-22

## 2015-02-22 NOTE — ED Notes (Signed)
Patient's left forearm is questionable for infiltration, patient denies pain, however site looks slightly swollen. IV removed, heat pack applied. New iv site in right forearm appears patent and is infusing bolus currently. Verbal order given to give another dose of pain/nausea med at this time. Patient is alert and oriented x 4 and rates pain 10/10.

## 2015-02-22 NOTE — Consult Note (Signed)
Consult: Right distal ureteral stone Requested by: Dr. Laneta Simmers  Chief Complaint: Right distal ureteral stone  History of Present Illness: 81 year old female who developed sudden onset of some right flank pain earlier today. A CT scan revealed a 1-2 mm right distal ureteral stone at the ureterovesical junction. Her pain is now controlled and she is tolerating by mouth. She has been admitted because her age, slight elevation of her lactic acid and creatinine (up to 1.1 compared to baseline of 0.8). of note, patient had an episode of pain and went to the bathroom. After she voided the pain improved.   Patient had a very similar episode 2 years ago when she passed a left 2 mm stone. She was admitted for observation and followed by Dr. Michela Pitcher. She passed the stone without intervention.     Past Medical History  Diagnosis Date  . Intermittent vertigo   . Renal colic   . Osteoporosis   . Hyperlipidemia   . GERD (gastroesophageal reflux disease)   . Diverticulosis of colon     by CT  . PONV (postoperative nausea and vomiting)   . Cataract 2013    right, no surgery at this time   Past Surgical History  Procedure Laterality Date  . Cataract extraction  2002    left eye   . Cholecystectomy  1989  . Abdominal hysterectomy    . Esophagogastroduodenoscopy  Jan  2007    prominent schatzki's ring, s/p 54F, small to moderate size hh  . Bunionectomy  06/27/2011    left , Dr Berline Lopes  . Eye surgery  2005 approx    cataract left  . Intramedullary (im) nail intertrochanteric Left 01/02/2014    Procedure: INTRAMEDULLARY (IM) NAIL INTERTROCHANTRIC;  Surgeon: Newt Minion, MD;  Location: [facility];  Service: Orthopedics;  Laterality: Left;  . Hip surgery Left 12/20/2013    Home Medications:   (Not in a hospital admission) Allergies:  Allergies  Allergen Reactions  . Alendronate Sodium Other (See Comments)    Chest Pain, Fatigue   . Penicillins Rash    Family History  Problem Relation Age of Onset   . Heart attack Father   . Liver cancer      family history   . Colon cancer Neg Hx   . Anesthesia problems Neg Hx   . Hypotension Neg Hx   . Malignant hyperthermia Neg Hx   . Pseudochol deficiency Neg Hx   . Diabetes Sister    Social History:  reports that she has never smoked. She does not have any smokeless tobacco history on file. She reports that she does not drink alcohol or use illicit drugs.  ROS: A complete review of systems was performed.  All systems are negative except for pertinent findings as noted. ROS   Physical Exam:  Vital signs in last 24 hours: Temp:  [97.7 F (36.5 C)] 97.7 F (36.5 C) (10/03 1659) Pulse Rate:  [57-75] 62 (10/03 2117) Resp:  [15-31] 17 (10/03 2117) BP: (144-195)/(59-132) 163/69 mmHg (10/03 2117) SpO2:  [95 %-100 %] 98 % (10/03 2117) Weight:  [45.36 kg (100 lb)] 45.36 kg (100 lb) (10/03 1659) General:  Alert and oriented, No acute distress, very pleasant lady HEENT: Normocephalic, atraumatic Cardiovascular: Regular rate and rhythm Lungs: Regular rate and effort Abdomen: Soft, nontender, nondistended, no abdominal masses Back: No CVA tenderness Extremities: No edema Neurologic: Grossly intact  Laboratory Data:  Results for orders placed or performed during the hospital encounter of 02/22/15 (from the past  24 hour(s))  CBG monitoring, ED     Status: None   Collection Time: 02/22/15  5:06 PM  Result Value Ref Range   Glucose-Capillary 96 65 - 99 mg/dL  CBC with Differential/Platelet     Status: Abnormal   Collection Time: 02/22/15  5:20 PM  Result Value Ref Range   WBC 12.1 (H) 4.0 - 10.5 K/uL   RBC 4.70 3.87 - 5.11 MIL/uL   Hemoglobin 14.3 12.0 - 15.0 g/dL   HCT 43.2 36.0 - 46.0 %   MCV 91.9 78.0 - 100.0 fL   MCH 30.4 26.0 - 34.0 pg   MCHC 33.1 30.0 - 36.0 g/dL   RDW 13.8 11.5 - 15.5 %   Platelets 253 150 - 400 K/uL   Neutrophils Relative % 46 %   Neutro Abs 5.6 1.7 - 7.7 K/uL   Lymphocytes Relative 45 %   Lymphs Abs 5.5 (H)  0.7 - 4.0 K/uL   Monocytes Relative 7 %   Monocytes Absolute 0.9 0.1 - 1.0 K/uL   Eosinophils Relative 1 %   Eosinophils Absolute 0.2 0.0 - 0.7 K/uL   Basophils Relative 1 %   Basophils Absolute 0.1 0.0 - 0.1 K/uL  Comprehensive metabolic panel     Status: Abnormal   Collection Time: 02/22/15  5:20 PM  Result Value Ref Range   Sodium 142 135 - 145 mmol/L   Potassium 3.4 (L) 3.5 - 5.1 mmol/L   Chloride 106 101 - 111 mmol/L   CO2 26 22 - 32 mmol/L   Glucose, Bld 145 (H) 65 - 99 mg/dL   BUN 19 6 - 20 mg/dL   Creatinine, Ser 1.14 (H) 0.44 - 1.00 mg/dL   Calcium 9.4 8.9 - 10.3 mg/dL   Total Protein 6.9 6.5 - 8.1 g/dL   Albumin 4.1 3.5 - 5.0 g/dL   AST 29 15 - 41 U/L   ALT 14 14 - 54 U/L   Alkaline Phosphatase 69 38 - 126 U/L   Total Bilirubin 0.5 0.3 - 1.2 mg/dL   GFR calc non Af Amer 41 (L) >60 mL/min   GFR calc Af Amer 47 (L) >60 mL/min   Anion gap 10 5 - 15  Lipase, blood     Status: None   Collection Time: 02/22/15  5:20 PM  Result Value Ref Range   Lipase 33 22 - 51 U/L  I-Stat CG4 Lactic Acid, ED     Status: Abnormal   Collection Time: 02/22/15  5:33 PM  Result Value Ref Range   Lactic Acid, Venous 3.09 (HH) 0.5 - 2.0 mmol/L   Comment NOTIFIED PHYSICIAN   Urinalysis, Routine w reflex microscopic (not at [facility])     Status: Abnormal   Collection Time: 02/22/15  7:11 PM  Result Value Ref Range   Color, Urine STRAW (A) YELLOW   APPearance CLEAR CLEAR   Specific Gravity, Urine 1.020 1.005 - 1.030   pH 6.5 5.0 - 8.0   Glucose, UA NEGATIVE NEGATIVE mg/dL   Hgb urine dipstick NEGATIVE NEGATIVE   Bilirubin Urine NEGATIVE NEGATIVE   Ketones, ur 15 (A) NEGATIVE mg/dL   Protein, ur NEGATIVE NEGATIVE mg/dL   Urobilinogen, UA 0.2 0.0 - 1.0 mg/dL   Nitrite NEGATIVE NEGATIVE   Leukocytes, UA NEGATIVE NEGATIVE   No results found for this or any previous visit (from the past 240 hour(s)). Creatinine:  Recent Labs  02/22/15 1720  CREATININE 1.14*     Impression/Assessment/plan: Right distal ureteral stone- it's possible she  passed the stone when she had some increased symptoms earlier that have resolved. Agree with fluids and pain control  Given her age and labs. There is no sign of any sepsis or need for urgent intervention.  I discussed with Dr. Anastasio Champion and he is okay with tamsulosin. I discussed with the patient the all label use of tamsulosin and continued medical expulsion therapy versus intervention with ureteroscopy and stent. She also was comfortable with continued stone passage. Will assess kidney in AM with renal u/s as stone may have passed. Even if hydro persists, if labs are normal, her pain is controlled and she is tolerating po she can be discharged and f/u as outpt.   Su Duma 02/22/2015, 9:31 PM

## 2015-02-22 NOTE — ED Notes (Signed)
MD at bedside. 

## 2015-02-22 NOTE — ED Notes (Signed)
Patient complaining of sudden onset of right flank pain with vomiting starting approximately 1 hour ago.

## 2015-02-22 NOTE — ED Provider Notes (Signed)
CSN: [id number]     Arrival date & time 02/22/15  1651 History   First MD Initiated Contact with Patient 02/22/15 1655     Chief Complaint  Patient presents with  . Abdominal Pain  . Emesis     (Consider location/radiation/quality/duration/timing/severity/associated sxs/prior Treatment) Patient is a 81 y.o. female presenting with abdominal pain. The history is provided by the patient.  Abdominal Pain Pain location:  RLQ Pain quality: sharp   Pain radiates to:  Does not radiate Pain severity:  Severe Onset quality:  Sudden Duration:  2 hours Timing:  Constant Progression:  Unchanged Chronicity:  New Relieved by:  Nothing Worsened by:  Nothing tried Ineffective treatments:  None tried Associated symptoms: vomiting   Associated symptoms: no constipation, no diarrhea, no fever, no hematuria and no shortness of breath   Risk factors: being elderly     Past Medical History  Diagnosis Date  . Intermittent vertigo   . Renal colic   . Osteoporosis   . Hyperlipidemia   . GERD (gastroesophageal reflux disease)   . Diverticulosis of colon     by CT  . PONV (postoperative nausea and vomiting)   . Cataract 2013    right, no surgery at this time   Past Surgical History  Procedure Laterality Date  . Cataract extraction  2002    left eye   . Cholecystectomy  1989  . Abdominal hysterectomy    . Esophagogastroduodenoscopy  Jan  2007    prominent schatzki's ring, s/p 28F, small to moderate size hh  . Bunionectomy  06/27/2011    left , Dr Berline Lopes  . Eye surgery  2005 approx    cataract left  . Intramedullary (im) nail intertrochanteric Left 01/02/2014    Procedure: INTRAMEDULLARY (IM) NAIL INTERTROCHANTRIC;  Surgeon: Newt Minion, MD;  Location: [facility];  Service: Orthopedics;  Laterality: Left;  . Hip surgery Left 12/20/2013   Family History  Problem Relation Age of Onset  . Heart attack Father   . Liver cancer      family history   . Colon cancer Neg Hx   . Anesthesia problems  Neg Hx   . Hypotension Neg Hx   . Malignant hyperthermia Neg Hx   . Pseudochol deficiency Neg Hx   . Diabetes Sister    Social History  Substance Use Topics  . Smoking status: Never Smoker   . Smokeless tobacco: None  . Alcohol Use: No   OB History    No data available     Review of Systems  Constitutional: Negative for fever.  Respiratory: Negative for shortness of breath.   Gastrointestinal: Positive for vomiting and abdominal pain. Negative for diarrhea and constipation.  Genitourinary: Negative for hematuria.  All other systems reviewed and are negative.     Allergies  Alendronate sodium and Penicillins  Home Medications   Prior to Admission medications   Medication Sig Start Date End Date Taking? Authorizing Provider  acetaminophen (TYLENOL) 500 MG tablet Take 1 tablet (500 mg total) by mouth every 6 (six) hours as needed for mild pain. 01/02/14   Newt Minion, MD  aspirin 81 MG tablet Take 81 mg by mouth daily.    Historical Provider, MD  calcium carbonate (TUMS EX) 750 MG chewable tablet Chew 2 tablets by mouth daily.     Historical Provider, MD  Cholecalciferol (VITAMIN D) 400 UNITS capsule Take 800 Units by mouth daily.     Historical Provider, MD  lovastatin (MEVACOR) 40 MG  tablet TAKE (1) TABLET BY MOUTH AT BEDTIME. 12/16/14   Fayrene Helper, MD  omeprazole (PRILOSEC) 20 MG capsule TAKE (1) CAPSULE BY MOUTH AT BEDTIME. 12/31/14   Fayrene Helper, MD  Vitamin D, Ergocalciferol, (DRISDOL) 50000 UNITS CAPS capsule Take 1 capsule (50,000 Units total) by mouth every 7 (seven) days. 12/16/14   Fayrene Helper, MD   BP 144/117 mmHg  Pulse 63  Temp(Src) 97.7 F (36.5 C) (Oral)  Resp 22  Ht 5' (1.524 m)  Wt 100 lb (45.36 kg)  BMI 19.53 kg/m2  SpO2 100% Physical Exam  Constitutional: She is oriented to person, place, and time. She appears well-developed and well-nourished. No distress.  HENT:  Head: Normocephalic.  Eyes: Conjunctivae are normal.  Neck:  Neck supple. No tracheal deviation present.  Cardiovascular: Normal rate and regular rhythm.   Pulmonary/Chest: Effort normal. No respiratory distress.  Abdominal: Soft. She exhibits no distension. There is tenderness (and right flank) in the right lower quadrant. There is guarding and CVA tenderness (on right). There is no rebound.  Positive rovsing's, pain out of proportion to exam  Neurological: She is alert and oriented to person, place, and time.  Skin: Skin is warm and dry.  Psychiatric: She has a normal mood and affect.    ED Course  Procedures (including critical care time) Emergency Focused Ultrasound Exam Limited Retroperitoneal Ultrasound of Kidneys  Performed and interpreted by Dr. Laneta Simmers Focused abdominal ultrasound with both kidneys imaged in transverse and longitudinal planes in real-time. Indication: flank pain Findings: bilateral kidneys present, + on right shadowing with hyperechoic area, + anechoic areas on right Interpretation: mild hydronephrosis visualized on right.  Stone visualized on right Images archived electronically  CPT Code: [place] Reviewed  CBC WITH DIFFERENTIAL/PLATELET - Abnormal; Notable for the following:    WBC 12.1 (*)    Lymphs Abs 5.5 (*)    All other components within normal limits  COMPREHENSIVE METABOLIC PANEL - Abnormal; Notable for the following:    Potassium 3.4 (*)    Glucose, Bld 145 (*)    Creatinine, Ser 1.14 (*)    GFR calc non Af Amer 41 (*)    GFR calc Af Amer 47 (*)    All other components within normal limits  URINALYSIS, ROUTINE W REFLEX MICROSCOPIC (NOT AT [facility]) - Abnormal; Notable for the following:    Color, Urine STRAW (*)    Ketones, ur 15 (*)    All other components within normal limits  I-STAT CG4 LACTIC ACID, ED - Abnormal; Notable for the following:    Lactic Acid, Venous 3.09 (*)    All other components within normal limits  URINE CULTURE  LIPASE, BLOOD  CBG MONITORING, ED    Imaging  Review Ct Renal Stone Study  02/22/2015   CLINICAL DATA:  Right flank pain for 1 hr  EXAM: CT ABDOMEN AND PELVIS WITHOUT CONTRAST  TECHNIQUE: Multidetector CT imaging of the abdomen and pelvis was performed following the standard protocol without IV contrast.  COMPARISON:  03/25/2013  FINDINGS: 2 mm right ureterovesical junction calculus is associated with mild right hydronephrosis and hydroureter. There is stranding along the course of the proximal ureter. There are calculi within the collecting system of the right kidney. The largest is in the lower pole measuring 11 mm. There are small calculi within the collecting system of the left kidney. No left ureteral calculi.  Large hiatal hernia  Postcholecystectomy  Liver, spleen, pancreas, adrenal glands are within  normal limits.  Diverticulosis of the sigmoid colon without acute diverticulitis.  No free-fluid.  No abnormal retroperitoneal adenopathy.  Advanced degenerative changes in the lumbar spine with levoscoliosis are noted.  IMPRESSION: 2 mm in right ureteral vesicle junction calculus is associated with secondary findings of right ureteral obstruction.  Bilateral nephrolithiasis.   Electronically Signed   By: Marybelle Killings M.D.   On: 02/22/2015 18:14   I have personally reviewed and evaluated these images and lab results as part of my medical decision-making.   EKG Interpretation   Date/Time:  Monday February 22 2015 17:07:33 EDT Ventricular Rate:  58 PR Interval:  218 QRS Duration: 82 QT Interval:  407 QTC Calculation: 400 R Axis:   47 Text Interpretation:  Sinus rhythm Borderline prolonged PR interval  Borderline repolarization abnormality Baseline wander in lead(s) II III  aVR aVL aVF V1 V4 Confirmed by Abhinav Mayorquin MD, Alane Hanssen [id number]) on 02/22/2015  5:14:38 PM      MDM   Final diagnoses:  Right flank pain  Ureterolithiasis  Hydronephrosis, right  Ureteral colic  Dehydration    81 year old female presents with acute onset right lower  quadrant and right-sided abdominal pain that started 2 hours prior to arrival. Appears extremely uncomfortable. Pain out of proportion to exam findings, has a history of prior kidney stones and last noted radiology study demonstrated 4 mm stone in the right lower pole. Symptoms are concerning for acute ureterolithiasis and a bedside ultrasound demonstrates stone and evidence of hydronephrosis. Considered mesenteric ischemia, volvulus, appendicitis, and other etiologies but given her history and clinical findings CT was ordered to evaluate for size location of offending stone as definitive study. Patient was able to pass stones spontaneously on previous episodes.  CT is confirmatory of suspected stone, 2 mm obstructing at the right UVJ. Urology was consulted for evaluation of this frail elderly patient. Has multiple lab abnormalities including mild leukocytosis, elevated lactic acid, increased from baseline creatinine level. Hospitalist was consulted for admission and will see the patient in the emergency department.   Leo Grosser, MD 02/22/15 2040

## 2015-02-22 NOTE — H&P (Signed)
[facility] History and Physical  Kristy Jarvis [id number] DOB: 06/22/1920 DOA: 02/22/2015  Referring physician: ER PCP: Tula Nakayama, MD   Chief Complaint: RLQ abdominal pain  HPI: Kristy Jarvis is a 81 y.o. female  This pleasant lady had sudden onset of RLQ pain without radiation associated with vomiting.No fever,dysuria,hematuria.CT scan in ER showed 34mm stone in right UV junction associated with right ureteral obstruction.Her pain is now controlled.She is being admitted for further management.   Review of Systems:  Apart from above,all other systems negative  Past Medical History  Diagnosis Date  . Intermittent vertigo   . Renal colic   . Osteoporosis   . Hyperlipidemia   . GERD (gastroesophageal reflux disease)   . Diverticulosis of colon     by CT  . PONV (postoperative nausea and vomiting)   . Cataract 2013    right, no surgery at this time   Past Surgical History  Procedure Laterality Date  . Cataract extraction  2002    left eye   . Cholecystectomy  1989  . Abdominal hysterectomy    . Esophagogastroduodenoscopy  Jan  2007    prominent schatzki's ring, s/p 63F, small to moderate size hh  . Bunionectomy  06/27/2011    left , Dr Berline Lopes  . Eye surgery  2005 approx    cataract left  . Intramedullary (im) nail intertrochanteric Left 01/02/2014    Procedure: INTRAMEDULLARY (IM) NAIL INTERTROCHANTRIC;  Surgeon: Newt Minion, MD;  Location: [facility];  Service: Orthopedics;  Laterality: Left;  . Hip surgery Left 12/20/2013   Social History:  reports that she has never smoked. She does not have any smokeless tobacco history on file. She reports that she does not drink alcohol or use illicit drugs.  Allergies  Allergen Reactions  . Alendronate Sodium Other (See Comments)    Chest Pain, Fatigue   . Penicillins Rash    Family History  Problem Relation Age of Onset  . Heart attack Father   . Liver cancer      family history   . Colon cancer Neg  Hx   . Anesthesia problems Neg Hx   . Hypotension Neg Hx   . Malignant hyperthermia Neg Hx   . Pseudochol deficiency Neg Hx   . Diabetes Sister     Prior to Admission medications   Medication Sig Start Date End Date Taking? Authorizing Provider  acetaminophen (TYLENOL) 500 MG tablet Take 1 tablet (500 mg total) by mouth every 6 (six) hours as needed for mild pain. 01/02/14  Yes Newt Minion, MD  aspirin 81 MG tablet Take 81 mg by mouth daily.   Yes Historical Provider, MD  calcium carbonate (TUMS EX) 750 MG chewable tablet Chew 2 tablets by mouth daily.    Yes Historical Provider, MD  Cholecalciferol (VITAMIN D) 400 UNITS capsule Take 800 Units by mouth daily.    Yes Historical Provider, MD  lovastatin (MEVACOR) 40 MG tablet TAKE (1) TABLET BY MOUTH AT BEDTIME. 12/16/14  Yes Fayrene Helper, MD  omeprazole (PRILOSEC) 20 MG capsule TAKE (1) CAPSULE BY MOUTH AT BEDTIME. 12/31/14  Yes Fayrene Helper, MD  Vitamin D, Ergocalciferol, (DRISDOL) 50000 UNITS CAPS capsule Take 1 capsule (50,000 Units total) by mouth every 7 (seven) days. 12/16/14  Yes Fayrene Helper, MD   Physical Exam: Filed Vitals:   02/22/15 2000 02/22/15 2015 02/22/15 2030 02/22/15 2045  BP: 169/59 179/67 167/68   Pulse: 64 60 62 75  Temp:  TempSrc:      Resp: 27 18 23 22   Height:      Weight:      SpO2: 100% 100% 99% 96%    Wt Readings from Last 3 Encounters:  02/22/15 45.36 kg (100 lb)  12/03/14 46.72 kg (103 lb)  07/13/14 45.36 kg (100 lb)    General:  Appears calm and comfortable Eyes: PERRL, normal lids, irises & conjunctiva ENT: grossly normal hearing, lips & tongue Neck: no LAD, masses or thyromegaly Cardiovascular: RRR, no m/r/g. No LE edema. Telemetry: SR, no arrhythmias  Respiratory: CTA bilaterally, no w/r/r. Normal respiratory effort. Abdomen: soft, ntnd Skin: no rash or induration seen on limited exam Musculoskeletal: grossly normal tone BUE/BLE Psychiatric: grossly normal mood and  affect, speech fluent and appropriate Neurologic: grossly non-focal.          Labs on Admission:  Basic Metabolic Panel:  Recent Labs Lab 02/22/15 1720  NA 142  K 3.4*  CL 106  CO2 26  GLUCOSE 145*  BUN 19  CREATININE 1.14*  CALCIUM 9.4   Liver Function Tests:  Recent Labs Lab 02/22/15 1720  AST 29  ALT 14  ALKPHOS 69  BILITOT 0.5  PROT 6.9  ALBUMIN 4.1    Recent Labs Lab 02/22/15 1720  LIPASE 33   No results for input(s): AMMONIA in the last 168 hours. CBC:  Recent Labs Lab 02/22/15 1720  WBC 12.1*  NEUTROABS 5.6  HGB 14.3  HCT 43.2  MCV 91.9  PLT 253   Cardiac Enzymes: No results for input(s): CKTOTAL, CKMB, CKMBINDEX, TROPONINI in the last 168 hours.  BNP (last 3 results) No results for input(s): BNP in the last 8760 hours.  ProBNP (last 3 results) No results for input(s): PROBNP in the last 8760 hours.  CBG:  Recent Labs Lab 02/22/15 1706  GLUCAP 96    Radiological Exams on Admission: Ct Renal Stone Study  02/22/2015   CLINICAL DATA:  Right flank pain for 1 hr  EXAM: CT ABDOMEN AND PELVIS WITHOUT CONTRAST  TECHNIQUE: Multidetector CT imaging of the abdomen and pelvis was performed following the standard protocol without IV contrast.  COMPARISON:  03/25/2013  FINDINGS: 2 mm right ureterovesical junction calculus is associated with mild right hydronephrosis and hydroureter. There is stranding along the course of the proximal ureter. There are calculi within the collecting system of the right kidney. The largest is in the lower pole measuring 11 mm. There are small calculi within the collecting system of the left kidney. No left ureteral calculi.  Large hiatal hernia  Postcholecystectomy  Liver, spleen, pancreas, adrenal glands are within normal limits.  Diverticulosis of the sigmoid colon without acute diverticulitis.  No free-fluid.  No abnormal retroperitoneal adenopathy.  Advanced degenerative changes in the lumbar spine with levoscoliosis  are noted.  IMPRESSION: 2 mm in right ureteral vesicle junction calculus is associated with secondary findings of right ureteral obstruction.  Bilateral nephrolithiasis.   Electronically Signed   By: Marybelle Killings M.D.   On: 02/22/2015 18:14     Assessment/Plan   1. Right UV junction kidney stone.Analgesia,iv fluids.Urology will see patient tomorrow.  Admit under observation.  Code Status: FULL   DVT Prophylaxis:Lovenox  Family Communication: I discussed plan with patient at bedside   Disposition Plan: Home when medically stable  Time spent: 66 mins  [facility] Pager [phone]

## 2015-02-22 NOTE — ED Notes (Signed)
Lab at bedside

## 2015-02-23 ENCOUNTER — Observation Stay (HOSPITAL_COMMUNITY): Payer: Medicare Other

## 2015-02-23 DIAGNOSIS — M810 Age-related osteoporosis without current pathological fracture: Secondary | ICD-10-CM

## 2015-02-23 DIAGNOSIS — N200 Calculus of kidney: Secondary | ICD-10-CM | POA: Diagnosis not present

## 2015-02-23 DIAGNOSIS — G3184 Mild cognitive impairment, so stated: Secondary | ICD-10-CM | POA: Diagnosis not present

## 2015-02-23 DIAGNOSIS — N132 Hydronephrosis with renal and ureteral calculous obstruction: Secondary | ICD-10-CM | POA: Diagnosis not present

## 2015-02-23 LAB — COMPREHENSIVE METABOLIC PANEL
ALT: 13 U/L — AB (ref 14–54)
AST: 22 U/L (ref 15–41)
Albumin: 3.1 g/dL — ABNORMAL LOW (ref 3.5–5.0)
Alkaline Phosphatase: 48 U/L (ref 38–126)
Anion gap: 5 (ref 5–15)
BILIRUBIN TOTAL: 0.9 mg/dL (ref 0.3–1.2)
BUN: 15 mg/dL (ref 6–20)
CHLORIDE: 109 mmol/L (ref 101–111)
CO2: 27 mmol/L (ref 22–32)
CREATININE: 1.09 mg/dL — AB (ref 0.44–1.00)
Calcium: 8.1 mg/dL — ABNORMAL LOW (ref 8.9–10.3)
GFR, EST AFRICAN AMERICAN: 50 mL/min — AB (ref >60)
GFR, EST NON AFRICAN AMERICAN: 43 mL/min — AB (ref >60)
Glucose, Bld: 97 mg/dL (ref 65–99)
Potassium: 3.9 mmol/L (ref 3.5–5.1)
Sodium: 141 mmol/L (ref 135–145)
TOTAL PROTEIN: 5.5 g/dL — AB (ref 6.5–8.1)

## 2015-02-23 LAB — CBC
HCT: 37.9 % (ref 36.0–46.0)
Hemoglobin: 12.5 g/dL (ref 12.0–15.0)
MCH: 30.4 pg (ref 26.0–34.0)
MCHC: 33 g/dL (ref 30.0–36.0)
MCV: 92.2 fL (ref 78.0–100.0)
Platelets: 213 K/uL (ref 150–400)
RBC: 4.11 MIL/uL (ref 3.87–5.11)
RDW: 14.1 % (ref 11.5–15.5)
WBC: 10.5 K/uL (ref 4.0–10.5)

## 2015-02-23 NOTE — Progress Notes (Signed)
Subjective: Patient reports absolutely no pain over the past few hours.  Urine has not been strained, so she does not know if she is past the stone.  Objective: Vital signs in last 24 hours: Temp:  [97.7 F (36.5 C)-98.1 F (36.7 C)] 98.1 F (36.7 C) (10/04 0537) Pulse Rate:  [57-75] 74 (10/04 0537) Resp:  [15-31] 18 (10/04 0537) BP: (111-195)/(45-132) 111/45 mmHg (10/04 0537) SpO2:  [95 %-100 %] 98 % (10/04 0537) Weight:  [45.36 kg (100 lb)-47.9 kg (105 lb 9.6 oz)] 47.9 kg (105 lb 9.6 oz) (10/03 2200)  Intake/Output from previous day: 10/03 0701 - 10/04 0700 In: 590 [I.V.:590] Out: 800 [Urine:800] Intake/Output this shift: Total I/O In: 240 [P.O.:240] Out: -   Physical Exam:  Constitutional: Vital signs reviewed. WD WN in NAD   Eyes: PERRL, No scleral icterus.   Pulmonary/Chest: Normal effort Abdominal: Soft. Non-tender, non-distended, no masses, organomegaly, or guarding present.  Genitourinary: Extremities: No cyanosis or edema   Lab Results:  Recent Labs  02/22/15 1720 02/23/15 0640  HGB 14.3 12.5  HCT 43.2 37.9   BMET  Recent Labs  02/22/15 1720 02/23/15 0640  NA 142 141  K 3.4* 3.9  CL 106 109  CO2 26 27  GLUCOSE 145* 97  BUN 19 15  CREATININE 1.14* 1.09*  CALCIUM 9.4 8.1*   No results for input(s): LABPT, INR in the last 72 hours. No results for input(s): LABURIN in the last 72 hours. Results for orders placed or performed in visit on 09/12/10  Urine Culture     Status: None   Collection Time: 09/12/10 11:24 AM  Result Value Ref Range Status   Culture ESCHERICHIA COLI  Final   Colony Count 30,000 COLONIES/ML  Final   Organism ID, Bacteria ESCHERICHIA COLI  Final      Susceptibility   Escherichia coli -  (no method available)    AMPICILLIN <=2 Sensitive     AMPICILLIN/SULBACTAM <=2 Sensitive     IMIPENEM <=1 Sensitive     CEFAZOLIN <=4 Sensitive     CEFOXITIN <=4 Sensitive     CEFTRIAXONE <=1 Sensitive     CEFTAZIDIME <=1 Sensitive     CEFEPIME <=1 Sensitive     AMIKACIN <=2 Sensitive     GENTAMICIN <=1 Sensitive     TOBRAMYCIN <=1 Sensitive     CIPROFLOXACIN <=0.25 Sensitive     LEVOFLOXACIN <=0.12 Sensitive     NITROFURANTOIN <=16 Sensitive     TRIMETH/SULFA <=20 Sensitive     Studies/Results: US Renal  02/23/2015   CLINICAL DATA:  Right ureteral calculus and hydronephrosis.  EXAM: RENAL / URINARY TRACT ULTRASOUND COMPLETE  COMPARISON:  CT on 02/22/2015  FINDINGS: Right Kidney:  Length: 8.8 cm. Echogenicity within normal limits. 1 cm calculus again seen in lower pole of right kidney. No evidence of hydronephrosis on today's exam.  Left Kidney:  Length: 8.5 cm. Echogenicity within normal limits. No mass or hydronephrosis visualized.  Bladder:  Appears normal for degree of bladder distention.  IMPRESSION: No evidence of hydronephrosis.  1 cm right lower pole intrarenal calculus.   Electronically Signed   By: Earle Gell M.D.   On: 02/23/2015 08:03   Ct Renal Stone Study  02/22/2015   CLINICAL DATA:  Right flank pain for 1 hr  EXAM: CT ABDOMEN AND PELVIS WITHOUT CONTRAST  TECHNIQUE: Multidetector CT imaging of the abdomen and pelvis was performed following the standard protocol without IV contrast.  COMPARISON:  03/25/2013  FINDINGS: 2 mm  right ureterovesical junction calculus is associated with mild right hydronephrosis and hydroureter. There is stranding along the course of the proximal ureter. There are calculi within the collecting system of the right kidney. The largest is in the lower pole measuring 11 mm. There are small calculi within the collecting system of the left kidney. No left ureteral calculi.  Large hiatal hernia  Postcholecystectomy  Liver, spleen, pancreas, adrenal glands are within normal limits.  Diverticulosis of the sigmoid colon without acute diverticulitis.  No free-fluid.  No abnormal retroperitoneal adenopathy.  Advanced degenerative changes in the lumbar spine with levoscoliosis are noted.  IMPRESSION: 2  mm in right ureteral vesicle junction calculus is associated with secondary findings of right ureteral obstruction.  Bilateral nephrolithiasis.   Electronically Signed   By: Marybelle Killings M.D.   On: 02/22/2015 18:14   renal ultrasound today revealed no hydronephrosis.  Hyperechoic area and right lower pole consistent with renal calculus initially noted on CT  Assessment/Plan:   Probable passed right ureteral stone-patient is asymptomatic.  She does have a remaining right lower pole stone that is asymptomatic.  Follow-up when necessary with [facility].     Franchot Gallo M 02/23/2015, 1:27 PM

## 2015-02-23 NOTE — Discharge Summary (Signed)
Physician Discharge Summary  Kristy Jarvis [id number] DOB: August 26, 1920 DOA: 02/22/2015  PCP: Tula Nakayama, MD  Admit date: 02/22/2015 Discharge date: 02/23/2015  Time spent: 35 minutes  Recommendations for Outpatient Follow-up:  1. Follow up with PCP as scheduled.   Discharge Diagnoses:  Active Problems:   Osteoporosis   MCI (mild cognitive impairment)   Right kidney stone   Discharge Condition: stable, no pain.  Diet recommendation: cardiac.   Filed Weights   02/22/15 1659 02/22/15 2200  Weight: 45.36 kg (100 lb) 47.9 kg (105 lb 9.6 oz)    History of present illness: Patient was admitted for RLQ pain, felt to be due to urolithiasis, by Dr Anastasio Champion on Oct 3rd, 2016.  As per his H and P:  " Kristy Jarvis is a 81 y.o. female  This pleasant lady had sudden onset of RLQ pain without radiation associated with vomiting.No fever,dysuria,hematuria.CT scan in ER showed 68mm stone in right UV junction associated with right ureteral obstruction.Her pain is now controlled.She is being admitted for further management.   Hospital Course: Patient was placed into the hospital, and she was given IVF and IV narcotics.  Dr Junious Silk of urology was consulted, and felt that patient has a non infected stone, and she likely may pass it.  After urination, her daughter noticed that her pain resolved completely and did not recur.  She did not require any further pain meds.  Her follow up renal US as recommended by urology was performed the following morning, and it showed no hydronephrosis.  Her renal fx remained normal, and she is anxious to go home.  She is stable for discharge.  She will avoid dehydration, and will follow up with her PCP in a week.  She will follow up with urology as scheduled.    Consultations:  Urology:  Dr Junious Silk.  Discharge Exam: Filed Vitals:   02/23/15 0537  BP: 111/45  Pulse: 74  Temp: 98.1 F (36.7 C)  Resp: 18    Discharge Instructions    Diet - low  sodium heart healthy    Complete by:  As directed      Discharge instructions    Complete by:  As directed   Follow up with your PCP in one week.     Increase activity slowly    Complete by:  As directed           Current Discharge Medication List    CONTINUE these medications which have NOT CHANGED   Details  acetaminophen (TYLENOL) 500 MG tablet Take 1 tablet (500 mg total) by mouth every 6 (six) hours as needed for mild pain. Qty: 30 tablet, Refills: 0    aspirin 81 MG tablet Take 81 mg by mouth daily.    calcium carbonate (TUMS EX) 750 MG chewable tablet Chew 2 tablets by mouth daily.     Cholecalciferol (VITAMIN D) 400 UNITS capsule Take 800 Units by mouth daily.     lovastatin (MEVACOR) 40 MG tablet TAKE (1) TABLET BY MOUTH AT BEDTIME. Qty: 30 tablet, Refills: 3    omeprazole (PRILOSEC) 20 MG capsule TAKE (1) CAPSULE BY MOUTH AT BEDTIME. Qty: 30 capsule, Refills: 2    Vitamin D, Ergocalciferol, (DRISDOL) 50000 UNITS CAPS capsule Take 1 capsule (50,000 Units total) by mouth every 7 (seven) days. Qty: 4 capsule, Refills: 5       Allergies  Allergen Reactions  . Alendronate Sodium Other (See Comments)    Chest Pain, Fatigue   . Penicillins  Rash      The results of significant diagnostics from this hospitalization (including imaging, microbiology, ancillary and laboratory) are listed below for reference.    Significant Diagnostic Studies: US Renal  02/23/2015   CLINICAL DATA:  Right ureteral calculus and hydronephrosis.  EXAM: RENAL / URINARY TRACT ULTRASOUND COMPLETE  COMPARISON:  CT on 02/22/2015  FINDINGS: Right Kidney:  Length: 8.8 cm. Echogenicity within normal limits. 1 cm calculus again seen in lower pole of right kidney. No evidence of hydronephrosis on today's exam.  Left Kidney:  Length: 8.5 cm. Echogenicity within normal limits. No mass or hydronephrosis visualized.  Bladder:  Appears normal for degree of bladder distention.  IMPRESSION: No evidence of  hydronephrosis.  1 cm right lower pole intrarenal calculus.   Electronically Signed   By: Earle Gell M.D.   On: 02/23/2015 08:03   Ct Renal Stone Study  02/22/2015   CLINICAL DATA:  Right flank pain for 1 hr  EXAM: CT ABDOMEN AND PELVIS WITHOUT CONTRAST  TECHNIQUE: Multidetector CT imaging of the abdomen and pelvis was performed following the standard protocol without IV contrast.  COMPARISON:  03/25/2013  FINDINGS: 2 mm right ureterovesical junction calculus is associated with mild right hydronephrosis and hydroureter. There is stranding along the course of the proximal ureter. There are calculi within the collecting system of the right kidney. The largest is in the lower pole measuring 11 mm. There are small calculi within the collecting system of the left kidney. No left ureteral calculi.  Large hiatal hernia  Postcholecystectomy  Liver, spleen, pancreas, adrenal glands are within normal limits.  Diverticulosis of the sigmoid colon without acute diverticulitis.  No free-fluid.  No abnormal retroperitoneal adenopathy.  Advanced degenerative changes in the lumbar spine with levoscoliosis are noted.  IMPRESSION: 2 mm in right ureteral vesicle junction calculus is associated with secondary findings of right ureteral obstruction.  Bilateral nephrolithiasis.   Electronically Signed   By: Marybelle Killings M.D.   On: 02/22/2015 18:14    Labs: Basic Metabolic Panel:  Recent Labs Lab 02/22/15 1720 02/23/15 0640  NA 142 141  K 3.4* 3.9  CL 106 109  CO2 26 27  GLUCOSE 145* 97  BUN 19 15  CREATININE 1.14* 1.09*  CALCIUM 9.4 8.1*   Liver Function Tests:  Recent Labs Lab 02/22/15 1720 02/23/15 0640  AST 29 22  ALT 14 13*  ALKPHOS 69 48  BILITOT 0.5 0.9  PROT 6.9 5.5*  ALBUMIN 4.1 3.1*    Recent Labs Lab 02/22/15 1720  LIPASE 33   No results for input(s): AMMONIA in the last 168 hours. CBC:  Recent Labs Lab 02/22/15 1720 02/23/15 0640  WBC 12.1* 10.5  NEUTROABS 5.6  --   HGB 14.3  12.5  HCT 43.2 37.9  MCV 91.9 92.2  PLT 253 213   CBG:  Recent Labs Lab 02/22/15 1706  GLUCAP 96   Signed:  Rolland Steinert  [facility] 02/23/2015, 12:51 PM

## 2015-02-23 NOTE — Care Management Note (Signed)
Case Management Note  Patient Details  Name: Kristy Jarvis MRN: [id number] Date of Birth: 12-24-1920  Subjective/Objective:                  Pt admitted from home with kidney stones. Pt lives alone and will return home at discharge. Pt is fairly independent with ADL's but has a cane if needed. Pt has a daughter who is very active in the care of the pt.  Action/Plan: Anticipate discharge home today. No CM needs noted.  Expected Discharge Date:  02/24/15               Expected Discharge Plan:  Home/Self Care  In-House Referral:  NA  Discharge planning Services  CM Consult  Post Acute Care Choice:  NA Choice offered to:  NA  DME Arranged:    DME Agency:     HH Arranged:    HH Agency:     Status of Service:  Completed, signed off  Medicare Important Message Given:    Date Medicare IM Given:    Medicare IM give by:    Date Additional Medicare IM Given:    Additional Medicare Important Message give by:     If discussed at [place] of Stay Meetings, dates discussed:    Additional Comments:  Joylene Draft, RN 02/23/2015, 11:09 AM

## 2015-02-23 NOTE — Progress Notes (Signed)
Discharge instructions given on medications,and follow up visits,patient,and family verbalized understanding. No c/o pain or discomfort noted. Vital signs stable. Accompanied by staff to an awaiting vehicle.

## 2015-02-24 LAB — URINE CULTURE: Culture: NO GROWTH

## 2015-03-09 ENCOUNTER — Encounter: Payer: Self-pay | Admitting: Family Medicine

## 2015-03-09 ENCOUNTER — Ambulatory Visit (INDEPENDENT_AMBULATORY_CARE_PROVIDER_SITE_OTHER): Payer: Medicare Other | Admitting: Family Medicine

## 2015-03-09 VITALS — BP 124/78 | HR 83 | Resp 16 | Ht 60.0 in | Wt 101.1 lb

## 2015-03-09 DIAGNOSIS — Z23 Encounter for immunization: Secondary | ICD-10-CM | POA: Diagnosis not present

## 2015-03-09 DIAGNOSIS — Z1211 Encounter for screening for malignant neoplasm of colon: Secondary | ICD-10-CM

## 2015-03-09 DIAGNOSIS — K219 Gastro-esophageal reflux disease without esophagitis: Secondary | ICD-10-CM

## 2015-03-09 DIAGNOSIS — Z09 Encounter for follow-up examination after completed treatment for conditions other than malignant neoplasm: Secondary | ICD-10-CM

## 2015-03-09 DIAGNOSIS — E785 Hyperlipidemia, unspecified: Secondary | ICD-10-CM

## 2015-03-09 DIAGNOSIS — R1032 Left lower quadrant pain: Secondary | ICD-10-CM

## 2015-03-09 LAB — POC HEMOCCULT BLD/STL (OFFICE/1-CARD/DIAGNOSTIC): Fecal Occult Blood, POC: NEGATIVE

## 2015-03-09 MED ORDER — CIPROFLOXACIN HCL 500 MG PO TABS: 500.0000 mg | ORAL_TABLET | Freq: Two times a day (BID) | ORAL | Status: DC

## 2015-03-09 NOTE — Progress Notes (Signed)
   Subjective:    Patient ID: Kristy Jarvis, female    DOB: May 25, 1920, 81 y.o.   MRN: [id number]  HPI Pt in for hospital follow up, hospitalized overnight for acute renal colic Summary and pertinent radiologic and lab data are reviewed. States much improved, denies blood in urinae, frequency or flank pain C/o LLQ pain, denies blood or mucus in stool, fever or chill Needs flu vaccine   Review of Systems See HPI Denies recent fever or chills. Denies sinus pressure, nasal congestion, ear pain or sore throat. Denies chest congestion, productive cough or wheezing. Denies chest pains, palpitations and leg swelling  Denies joint pain, swelling and limitation in mobility. Denies headaches, seizures, numbness, or tingling. Denies depression, anxiety or insomnia. Denies skin break down or rash.        Objective:   Physical Exam  BP 124/78 mmHg  Pulse 83  Resp 16  Ht 5' (1.524 m)  Wt 101 lb 1.9 oz (45.868 kg)  BMI 19.75 kg/m2  SpO2 96% Patient alert and oriented and in no cardiopulmonary distress.  HEENT: No facial asymmetry, EOMI,   oropharynx pink and moist.  Neck supple no JVD, no mass.  Chest: Clear to auscultation bilaterally.  CVS: S1, S2 no murmurs, no S3.Regular rate.  ABD: Soft , left lower quadrant tenderness, mild guarding, no rebound. Normal BS No organomegaly or mass. No renal angle or suprapubic tenderness Rectal: heme negative stool, no mass   Ext: No edema  MS: Adequate ROM spine, shoulders, hips and knees.  Skin: Intact, no ulcerations or rash noted.  Psych: Good eye contact, normal affect. Memory intact not anxious or depressed appearing.  CNS: CN 2-12 intact, power,  normal throughout.no focal deficits noted.       Assessment & Plan:  Hospital discharge follow-up Improved significantly, no urinary complaints or flank pain, had renal colic when recently admitted  GERD Controlled, no change in  medication   Hyperlipemia Hyperlipidemia:Low fat diet discussed and encouraged.   Lipid Panel  Lab Results  Component Value Date   CHOL 174 12/08/2014   HDL 75 12/08/2014   LDLCALC 78 12/08/2014   LDLDIRECT 85 06/17/2012   TRIG 106 12/08/2014   CHOLHDL 2.3 12/08/2014   Controlled, no change in medication      LLQ abdominal pain likely due to mild flare of diverticular disease, 3 day course of cipro prescribed, call if symptoms persist or worsen

## 2015-03-09 NOTE — Patient Instructions (Addendum)
F/u in December as before, call if you need me sooner.  Rectal exam shows no blood in the stool  I think abdominal pain is from diverticulosis, medication for3 days sent in  IF YOU  CONTINUE to have pain next week pls call, I will refer you to your stomach Doc  Flu vaccine today   Thankful you could get out of hospital as quickly as you did  Thanks for choosing [facility], we consider it a privelige to serve you.

## 2015-03-31 ENCOUNTER — Other Ambulatory Visit: Payer: Self-pay | Admitting: Family Medicine

## 2015-04-04 DIAGNOSIS — Z09 Encounter for follow-up examination after completed treatment for conditions other than malignant neoplasm: Secondary | ICD-10-CM | POA: Insufficient documentation

## 2015-04-04 DIAGNOSIS — R1032 Left lower quadrant pain: Secondary | ICD-10-CM | POA: Insufficient documentation

## 2015-04-04 NOTE — Assessment & Plan Note (Signed)
Controlled, no change in medication  

## 2015-04-04 NOTE — Assessment & Plan Note (Signed)
Hyperlipidemia:Low fat diet discussed and encouraged.   Lipid Panel  Lab Results  Component Value Date   CHOL 174 12/08/2014   HDL 75 12/08/2014   LDLCALC 78 12/08/2014   LDLDIRECT 85 06/17/2012   TRIG 106 12/08/2014   CHOLHDL 2.3 12/08/2014   Controlled, no change in medication

## 2015-04-04 NOTE — Assessment & Plan Note (Signed)
likely due to mild flare of diverticular disease, 3 day course of cipro prescribed, call if symptoms persist or worsen

## 2015-04-04 NOTE — Assessment & Plan Note (Signed)
Improved significantly, no urinary complaints or flank pain, had renal colic when recently admitted

## 2015-04-05 ENCOUNTER — Encounter (HOSPITAL_COMMUNITY)
Admission: RE | Admit: 2015-04-05 | Discharge: 2015-04-05 | Disposition: A | Discharge status: Home / Self Care | Payer: Medicare Other | Source: Ambulatory Visit | Attending: Family Medicine | Admitting: Family Medicine

## 2015-04-05 DIAGNOSIS — N183 Chronic kidney disease, stage 3 (moderate): Secondary | ICD-10-CM | POA: Diagnosis not present

## 2015-04-05 LAB — COMPREHENSIVE METABOLIC PANEL
ALBUMIN: 4.1 g/dL (ref 3.5–5.0)
ALT: 15 U/L (ref 14–54)
ANION GAP: 7 (ref 5–15)
AST: 28 U/L (ref 15–41)
Alkaline Phosphatase: 65 U/L (ref 38–126)
BUN: 11 mg/dL (ref 6–20)
CO2: 29 mmol/L (ref 22–32)
Calcium: 9.5 mg/dL (ref 8.9–10.3)
Chloride: 108 mmol/L (ref 101–111)
Creatinine, Ser: 0.99 mg/dL (ref 0.44–1.00)
GFR calc Af Amer: 56 mL/min — ABNORMAL LOW (ref >60)
GFR calc non Af Amer: 48 mL/min — ABNORMAL LOW (ref >60)
GLUCOSE: 114 mg/dL — AB (ref 65–99)
POTASSIUM: 3.8 mmol/L (ref 3.5–5.1)
SODIUM: 144 mmol/L (ref 135–145)
TOTAL PROTEIN: 7.1 g/dL (ref 6.5–8.1)
Total Bilirubin: 1 mg/dL (ref 0.3–1.2)

## 2015-04-05 NOTE — Progress Notes (Signed)
Because of today's creatine results, reclast not given. Note faxed to Dr Moshe Cipro. Per Mickel Baas, [facility].

## 2015-04-22 DEATH — deceased

## 2015-05-05 ENCOUNTER — Encounter: Payer: Self-pay | Admitting: Family Medicine

## 2015-05-05 ENCOUNTER — Ambulatory Visit (INDEPENDENT_AMBULATORY_CARE_PROVIDER_SITE_OTHER): Payer: Medicare Other | Admitting: Family Medicine

## 2015-05-05 VITALS — BP 126/72 | HR 68 | Resp 18 | Ht 60.0 in | Wt 102.0 lb

## 2015-05-05 DIAGNOSIS — M810 Age-related osteoporosis without current pathological fracture: Secondary | ICD-10-CM | POA: Diagnosis not present

## 2015-05-05 DIAGNOSIS — N183 Chronic kidney disease, stage 3 unspecified: Secondary | ICD-10-CM

## 2015-05-05 DIAGNOSIS — G3184 Mild cognitive impairment, so stated: Secondary | ICD-10-CM

## 2015-05-05 DIAGNOSIS — K2100 Gastro-esophageal reflux disease with esophagitis, without bleeding: Secondary | ICD-10-CM

## 2015-05-05 DIAGNOSIS — E785 Hyperlipidemia, unspecified: Secondary | ICD-10-CM | POA: Diagnosis not present

## 2015-05-05 DIAGNOSIS — Z8781 Personal history of (healed) traumatic fracture: Secondary | ICD-10-CM | POA: Diagnosis not present

## 2015-05-05 MED ORDER — RANITIDINE HCL 150 MG PO CAPS: 150.0000 mg | ORAL_CAPSULE | Freq: Two times a day (BID) | ORAL | Status: DC

## 2015-05-05 NOTE — Patient Instructions (Addendum)
Annual physical exam in 4.5 month, call if you need me sooner  New to help with abdominal pain is zantac twice daily, call early in the new year if this is still not better , I will refer you to your GI Doc Continue omeprazole as before  You are referred to Dr Dorris Fetch for help with osteoporosis treatment  Happy birthday in am!!, and many many more  Thanks for choosing [facility], we consider it a privelige to serve you.  All the best for 2017!

## 2015-05-05 NOTE — Progress Notes (Signed)
Subjective:    Patient ID: Kristy Jarvis, female    DOB: [birth year], 81 y.o.   MRN: [id number]  HPI   Kristy Jarvis     MRN: [id number]      DOB: 02/27/1921   HPI Kristy Jarvis is here for follow up and re-evaluation of chronic medical conditions, medication management and review of any available recent lab and radiology data.  Preventive health is updated, specifically  Cancer screening and Immunization.   Questions and  concerns regarding abnormal labs showing impaired renal function and therefore her ability to receive reclast are discussed with her daughter and the patient, both of whom are present The PT denies any adverse reactions to current medications since the last visit.  C/o abdominal pain after eating intermittently , per her daughter, pt denies dysphagia and constant pain, appetite and weight are unchanged, no change in BM note   ROS Denies recent fever or chills. Denies sinus pressure, nasal congestion, ear pain or sore throat. Denies chest congestion, productive cough or wheezing. Denies chest pains, palpitations and leg swelling Denies  nausea, vomiting,diarrhea or constipation.   Denies dysuria, frequency, hesitancy or incontinence. Denies joint pain, swelling and limitation in mobility. Denies headaches, seizures, numbness, or tingling. Denies depression, anxiety or insomnia. Denies skin break down or rash.   PE  BP 126/72 mmHg  Pulse 68  Resp 18  Ht 5' (1.524 m)  Wt 102 lb (46.267 kg)  BMI 19.92 kg/m2  SpO2 93%  Patient alert and oriented and in no cardiopulmonary distress.  HEENT: No facial asymmetry, EOMI,   oropharynx pink and moist.  Neck supple no JVD, no mass.  Chest: Clear to auscultation bilaterally.  CVS: S1, S2 no murmurs, no S3.Regular rate.  ABD: Soft no  localized tenderness or guarding, normal BS, no organomegaly or mass   Ext: No edema  MS: Adequate ROM spine, shoulders, hips and knees.  Skin: Intact, no ulcerations or  rash noted.  Psych: Good eye contact, normal affect. Memory intact not anxious or depressed appearing.  CNS: CN 2-12 intact, power,  normal throughout.no focal deficits noted.   Assessment & Plan   GERD Large hiatal hernia with uncontrolled GERD symptoms, per daughter pt often c/o abdominal pain when she eats Asd zantac, if pain persists , she is to call in next 2 to 4 weeks. She ahs ahd GI and surgical evaluation for the hernia, surgery not recommended  Osteoporosis Severe osteoporosis with fracture history, unable to take oral bisphosphonate's due to severe GERD, failed attempt at reclast due to impaired renal function, will obtain endo consult for help with management , she is referred  Hyperlipemia Hyperlipidemia:Low fat diet discussed and encouraged. Well controlled no change in management  Updated lab needed at/ before next visit.    Lipid Panel  Lab Results  Component Value Date   CHOL 174 12/08/2014   HDL 75 12/08/2014   LDLCALC 78 12/08/2014   LDLDIRECT 85 06/17/2012   TRIG 106 12/08/2014   CHOLHDL 2.3 12/08/2014        CKD (chronic kidney disease) stage 3, GFR 30-59 ml/min Kidney disease limiting aility to treat osteoporosis with reclast, need help from endo for optimalmanagement  MCI (mild cognitive impairment) noirmal mini cog test, no need for medication at this time,  Mild age appropriate impairment      Review of Systems     Objective:   Physical Exam        Assessment & Plan:

## 2015-05-24 DIAGNOSIS — N183 Chronic kidney disease, stage 3 unspecified: Secondary | ICD-10-CM | POA: Insufficient documentation

## 2015-05-24 DIAGNOSIS — N184 Chronic kidney disease, stage 4 (severe): Secondary | ICD-10-CM | POA: Insufficient documentation

## 2015-05-24 NOTE — Assessment & Plan Note (Signed)
noirmal mini cog test, no need for medication at this time,  Mild age appropriate impairment

## 2015-05-24 NOTE — Assessment & Plan Note (Signed)
Kidney disease limiting aility to treat osteoporosis with reclast, need help from endo for optimalmanagement

## 2015-05-24 NOTE — Assessment & Plan Note (Addendum)
Hyperlipidemia:Low fat diet discussed and encouraged. Well controlled no change in management  Updated lab needed at/ before next visit.    Lipid Panel  Lab Results  Component Value Date   CHOL 174 12/08/2014   HDL 75 12/08/2014   LDLCALC 78 12/08/2014   LDLDIRECT 85 06/17/2012   TRIG 106 12/08/2014   CHOLHDL 2.3 12/08/2014

## 2015-05-24 NOTE — Assessment & Plan Note (Signed)
Large hiatal hernia with uncontrolled GERD symptoms, per daughter pt often c/o abdominal pain when she eats Asd zantac, if pain persists , she is to call in next 2 to 4 weeks. She ahs ahd GI and surgical evaluation for the hernia, surgery not recommended

## 2015-05-24 NOTE — Assessment & Plan Note (Signed)
Severe osteoporosis with fracture history, unable to take oral bisphosphonate's due to severe GERD, failed attempt at reclast due to impaired renal function, will obtain endo consult for help with management , she is referred

## 2015-06-04 ENCOUNTER — Emergency Department (HOSPITAL_COMMUNITY)
Admission: EM | Admit: 2015-06-04 | Discharge: 2015-06-04 | Disposition: A | Discharge status: Home / Self Care | Payer: Medicare Other | Attending: Emergency Medicine | Admitting: Emergency Medicine

## 2015-06-04 ENCOUNTER — Emergency Department (HOSPITAL_COMMUNITY): Payer: Medicare Other

## 2015-06-04 ENCOUNTER — Encounter (HOSPITAL_COMMUNITY): Payer: Self-pay | Admitting: *Deleted

## 2015-06-04 DIAGNOSIS — Z880 Allergy status to penicillin: Secondary | ICD-10-CM | POA: Insufficient documentation

## 2015-06-04 DIAGNOSIS — Y9289 Other specified places as the place of occurrence of the external cause: Secondary | ICD-10-CM | POA: Insufficient documentation

## 2015-06-04 DIAGNOSIS — Y998 Other external cause status: Secondary | ICD-10-CM | POA: Diagnosis not present

## 2015-06-04 DIAGNOSIS — E785 Hyperlipidemia, unspecified: Secondary | ICD-10-CM | POA: Insufficient documentation

## 2015-06-04 DIAGNOSIS — W108XXA Fall (on) (from) other stairs and steps, initial encounter: Secondary | ICD-10-CM | POA: Diagnosis not present

## 2015-06-04 DIAGNOSIS — Z87442 Personal history of urinary calculi: Secondary | ICD-10-CM | POA: Insufficient documentation

## 2015-06-04 DIAGNOSIS — Y9389 Activity, other specified: Secondary | ICD-10-CM | POA: Diagnosis not present

## 2015-06-04 DIAGNOSIS — Z7982 Long term (current) use of aspirin: Secondary | ICD-10-CM | POA: Insufficient documentation

## 2015-06-04 DIAGNOSIS — K219 Gastro-esophageal reflux disease without esophagitis: Secondary | ICD-10-CM | POA: Diagnosis not present

## 2015-06-04 DIAGNOSIS — S42292A Other displaced fracture of upper end of left humerus, initial encounter for closed fracture: Secondary | ICD-10-CM | POA: Insufficient documentation

## 2015-06-04 DIAGNOSIS — Z79899 Other long term (current) drug therapy: Secondary | ICD-10-CM | POA: Insufficient documentation

## 2015-06-04 DIAGNOSIS — S0990XA Unspecified injury of head, initial encounter: Secondary | ICD-10-CM | POA: Insufficient documentation

## 2015-06-04 DIAGNOSIS — S01112A Laceration without foreign body of left eyelid and periocular area, initial encounter: Secondary | ICD-10-CM | POA: Insufficient documentation

## 2015-06-04 DIAGNOSIS — M810 Age-related osteoporosis without current pathological fracture: Secondary | ICD-10-CM | POA: Diagnosis not present

## 2015-06-04 DIAGNOSIS — Z8669 Personal history of other diseases of the nervous system and sense organs: Secondary | ICD-10-CM | POA: Diagnosis not present

## 2015-06-04 DIAGNOSIS — M25561 Pain in right knee: Secondary | ICD-10-CM | POA: Diagnosis not present

## 2015-06-04 DIAGNOSIS — S42302A Unspecified fracture of shaft of humerus, left arm, initial encounter for closed fracture: Secondary | ICD-10-CM

## 2015-06-04 DIAGNOSIS — S4992XA Unspecified injury of left shoulder and upper arm, initial encounter: Secondary | ICD-10-CM | POA: Diagnosis present

## 2015-06-04 DIAGNOSIS — S0181XA Laceration without foreign body of other part of head, initial encounter: Secondary | ICD-10-CM | POA: Diagnosis not present

## 2015-06-04 LAB — CBC WITH DIFFERENTIAL/PLATELET
BASOS ABS: 0.1 10*3/uL (ref 0.0–0.1)
Basophils Relative: 1 %
EOS PCT: 2 %
Eosinophils Absolute: 0.2 10*3/uL (ref 0.0–0.7)
HCT: 43.3 % (ref 36.0–46.0)
Hemoglobin: 14 g/dL (ref 12.0–15.0)
LYMPHS PCT: 43 %
Lymphs Abs: 5.2 10*3/uL — ABNORMAL HIGH (ref 0.7–4.0)
MCH: 29.9 pg (ref 26.0–34.0)
MCHC: 32.3 g/dL (ref 30.0–36.0)
MCV: 92.5 fL (ref 78.0–100.0)
Monocytes Absolute: 0.8 10*3/uL (ref 0.1–1.0)
Monocytes Relative: 7 %
NEUTROS ABS: 5.6 10*3/uL (ref 1.7–7.7)
Neutrophils Relative %: 47 %
PLATELETS: 239 10*3/uL (ref 150–400)
RBC: 4.68 MIL/uL (ref 3.87–5.11)
RDW: 13.8 % (ref 11.5–15.5)
WBC: 11.8 10*3/uL — AB (ref 4.0–10.5)

## 2015-06-04 LAB — APTT: APTT: 23 s — AB (ref 24–37)

## 2015-06-04 LAB — PROTIME-INR
INR: 1.08 (ref 0.00–1.49)
PROTHROMBIN TIME: 14.2 s (ref 11.6–15.2)

## 2015-06-04 LAB — COMPREHENSIVE METABOLIC PANEL
ALBUMIN: 3.8 g/dL (ref 3.5–5.0)
ALT: 14 U/L (ref 14–54)
AST: 25 U/L (ref 15–41)
Alkaline Phosphatase: 70 U/L (ref 38–126)
Anion gap: 12 (ref 5–15)
BUN: 22 mg/dL — AB (ref 6–20)
CHLORIDE: 107 mmol/L (ref 101–111)
CO2: 24 mmol/L (ref 22–32)
CREATININE: 1.07 mg/dL — AB (ref 0.44–1.00)
Calcium: 9.4 mg/dL (ref 8.9–10.3)
GFR calc Af Amer: 51 mL/min — ABNORMAL LOW (ref >60)
GFR, EST NON AFRICAN AMERICAN: 44 mL/min — AB (ref >60)
GLUCOSE: 158 mg/dL — AB (ref 65–99)
Potassium: 3.7 mmol/L (ref 3.5–5.1)
Sodium: 143 mmol/L (ref 135–145)
Total Bilirubin: 0.9 mg/dL (ref 0.3–1.2)
Total Protein: 6.6 g/dL (ref 6.5–8.1)

## 2015-06-04 LAB — TROPONIN I

## 2015-06-04 MED ORDER — FENTANYL CITRATE (PF) 100 MCG/2ML IJ SOLN
50.0000 ug | Freq: Once | INTRAMUSCULAR | Status: AC
Start: 2015-06-04 — End: 2015-06-04
  Administered 2015-06-04: 50 ug via INTRAVENOUS
  Filled 2015-06-04: qty 2

## 2015-06-04 MED ORDER — ACETAMINOPHEN-CODEINE #3 300-30 MG PO TABS: 1.0000 | ORAL_TABLET | Freq: Four times a day (QID) | ORAL | Status: DC | PRN

## 2015-06-04 MED ORDER — BACITRACIN ZINC 500 UNIT/GM EX OINT
TOPICAL_OINTMENT | CUTANEOUS | Status: AC
  Filled 2015-06-04: qty 0.9

## 2015-06-04 MED ORDER — BACITRACIN ZINC 500 UNIT/GM EX OINT: 1.0000 "application " | TOPICAL_OINTMENT | Freq: Two times a day (BID) | CUTANEOUS | Status: DC

## 2015-06-04 MED ORDER — LIDOCAINE-EPINEPHRINE (PF) 1 %-1:200000 IJ SOLN
10.0000 mL | Freq: Once | INTRAMUSCULAR | Status: AC
  Administered 2015-06-04: 10 mL
  Filled 2015-06-04: qty 10

## 2015-06-04 NOTE — ED Notes (Signed)
Bacitracin applied to left upper eyelid.

## 2015-06-04 NOTE — ED Notes (Signed)
MD at bedside. 

## 2015-06-04 NOTE — ED Notes (Signed)
Pt walked with assistance, unsteady gait, pt felt like right knee was going to "give out" on her.

## 2015-06-04 NOTE — ED Notes (Addendum)
Pt was here visiting someone and fell in the hallway. Pt is unsure what caused the fall. Pt is currently unable to say who she was visiting. Pt states she drove here. Abrasion to left eye and pain to left shoulder. NT from the floor states family is coming. Pt repeating herself and asked if she fell in the parking lot.

## 2015-06-04 NOTE — ED Provider Notes (Addendum)
CSN: [id number]     Arrival date & time 06/04/15  0940 History  By signing my name below, I, Jolayne Panther, attest that this documentation has been prepared under the direction and in the presence of Noemi Chapel, MD. Electronically Signed: Jolayne Panther, Scribe. 06/04/2015. 10:13 AM.   Chief Complaint  Patient presents with  . Fall    The history is provided by the patient and a relative. No language interpreter was used.    HPI Comments: Kristy Jarvis is a 82 y.o. female who presents to the Emergency Department complaining of left arm pain following a fall down the stairs today in the ED. She also has a small, superficial laceration to her left eyelid. Pt does not remember falling but her daughter reports that she was upstairs in the ED visiting her sister in law before she fell down the stairs. Pt notes that she drove herself to the hospital this morning. Her daughter reports that her mother currently lives on her own and she also notes that her mother has had mild memory issues in the past. Pt denies nausea and abdominal pain. She also denies hx of stroke, kidney or liver disease, and MI.    Past Medical History  Diagnosis Date  . Intermittent vertigo   . Renal colic   . Osteoporosis   . Hyperlipidemia   . GERD (gastroesophageal reflux disease)   . Diverticulosis of colon     by CT  . PONV (postoperative nausea and vomiting)   . Cataract 2013    right, no surgery at this time   Past Surgical History  Procedure Laterality Date  . Cataract extraction  2002    left eye   . Cholecystectomy  1989  . Abdominal hysterectomy    . Esophagogastroduodenoscopy  Jan  2007    prominent schatzki's ring, s/p 67F, small to moderate size hh  . Bunionectomy  06/27/2011    left , Dr Berline Lopes  . Eye surgery  2005 approx    cataract left  . Intramedullary (im) nail intertrochanteric Left 01/02/2014    Procedure: INTRAMEDULLARY (IM) NAIL INTERTROCHANTRIC;  Surgeon: Newt Minion,  MD;  Location: [facility];  Service: Orthopedics;  Laterality: Left;  . Hip surgery Left 12/20/2013   Family History  Problem Relation Age of Onset  . Heart attack Father   . Liver cancer      family history   . Colon cancer Neg Hx   . Anesthesia problems Neg Hx   . Hypotension Neg Hx   . Malignant hyperthermia Neg Hx   . Pseudochol deficiency Neg Hx   . Diabetes Sister    Social History  Substance Use Topics  . Smoking status: Never Smoker   . Smokeless tobacco: None  . Alcohol Use: No   OB History    No data available     Review of Systems  Unable to perform ROS: Mental status change   Allergies  Alendronate sodium and Penicillins  Home Medications   Prior to Admission medications   Medication Sig Start Date End Date Taking? Authorizing Provider  acetaminophen (TYLENOL) 500 MG tablet Take 1 tablet (500 mg total) by mouth every 6 (six) hours as needed for mild pain. 01/02/14  Yes Newt Minion, MD  aspirin 81 MG tablet Take 81 mg by mouth daily.   Yes Historical Provider, MD  calcium carbonate (TUMS EX) 750 MG chewable tablet Chew 2 tablets by mouth daily.    Yes Historical  Provider, MD  Cholecalciferol (VITAMIN D) 400 UNITS capsule Take 800 Units by mouth daily.    Yes Historical Provider, MD  lovastatin (MEVACOR) 40 MG tablet TAKE (1) TABLET BY MOUTH AT BEDTIME. 12/16/14  Yes Fayrene Helper, MD  omeprazole (PRILOSEC) 20 MG capsule TAKE (1) CAPSULE BY MOUTH AT BEDTIME. 04/01/15  Yes Fayrene Helper, MD  ranitidine (ZANTAC) 150 MG capsule Take 1 capsule (150 mg total) by mouth 2 (two) times daily. 05/05/15  Yes Fayrene Helper, MD  acetaminophen-codeine (TYLENOL #3) 300-30 MG tablet Take 1-2 tablets by mouth every 6 (six) hours as needed for moderate pain. 06/04/15   Noemi Chapel, MD  bacitracin ointment Apply 1 application topically 2 (two) times daily. 06/04/15   Noemi Chapel, MD   BP 114/87 mmHg  Pulse 88  Temp(Src) 97.9 F (36.6 C) (Oral)  Resp 17  Ht 5' (1.524  m)  Wt 102 lb (46.267 kg)  BMI 19.92 kg/m2  SpO2 99% Physical Exam  Constitutional: She appears well-developed and well-nourished. No distress.  HENT:  Head: Normocephalic.  Mouth/Throat: Oropharynx is clear and moist. No oropharyngeal exudate.  Left eyelid 1 cm superficial lac, not bleeding  no facial tenderness, deformity, malocclusion or hemotympanum.  no battle's sign or racoon eyes.   Eyes: Conjunctivae and EOM are normal. Pupils are equal, round, and reactive to light. Right eye exhibits no discharge. Left eye exhibits no discharge. No scleral icterus.  Neck: Normal range of motion. Neck supple. No JVD present. No thyromegaly present.  Cardiovascular: Normal rate, regular rhythm, normal heart sounds and intact distal pulses.  Exam reveals no gallop and no friction rub.   No murmur heard. Pulmonary/Chest: Effort normal and breath sounds normal. No respiratory distress. She has no wheezes. She has no rales.  Abdominal: Soft. Bowel sounds are normal. She exhibits no distension and no mass. There is no tenderness.  Musculoskeletal: Normal range of motion. She exhibits no edema or tenderness.  Tenderness and decreased ROM of left shoulder secondary to pain All other joints are supple and compartments soft   Lymphadenopathy:    She has no cervical adenopathy.  Neurological: She is alert. Coordination normal.  Skin: Skin is warm and dry. No rash noted. No erythema.  Psychiatric: She has a normal mood and affect. Her behavior is normal.  Nursing note and vitals reviewed.   ED Course  Procedures  DIAGNOSTIC STUDIES:    Oxygen Saturation is 99% on RA, normal by my interpretation.   COORDINATION OF CARE:  10:07 AM Will review EKG. Discussed treatment plan with pt at bedside and pt agreed to plan.   Labs Review Labs Reviewed  CBC WITH DIFFERENTIAL/PLATELET - Abnormal; Notable for the following:    WBC 11.8 (*)    Lymphs Abs 5.2 (*)    All other components within normal limits   COMPREHENSIVE METABOLIC PANEL - Abnormal; Notable for the following:    Glucose, Bld 158 (*)    BUN 22 (*)    Creatinine, Ser 1.07 (*)    GFR calc non Af Amer 44 (*)    GFR calc Af Amer 51 (*)    All other components within normal limits  APTT - Abnormal; Notable for the following:    aPTT 23 (*)    All other components within normal limits  TROPONIN I  PROTIME-INR    Imaging Review Ct Head Wo Contrast  06/04/2015  CLINICAL DATA:  82 year old female who fell today with left side forehead injury, laceration.  Initial encounter. EXAM: CT HEAD WITHOUT CONTRAST TECHNIQUE: Contiguous axial images were obtained from the base of the skull through the vertex without intravenous contrast. COMPARISON:  None. FINDINGS: Osteopenia. Comminuted fractures of the left lateral orbital wall, left orbital floor, left maxilla, and left zygomatic arch. Fluid level in the left maxillary sinus. Gas in the left masticator space. Mild left periorbital soft tissue swelling. No definite left intraorbital hematoma. Minimal ethmoid sinus mucosal thickening. Other Visualized paranasal sinuses and mastoids are clear. Right orbit and scalp soft tissues appear normal. No calvarium fracture identified. Calcified atherosclerosis at the skull base. Cerebral volume is within normal limits for age. No acute intracranial hemorrhage identified. No cortically based acute infarct identified. No ventriculomegaly. Normal for age gray-white matter differentiation; mild nonspecific periventricular white matter hypodensity. IMPRESSION: 1. Left face tripod fracture. Recommend follow-up noncontrast Face CT. 2. No acute traumatic injury to the brain identified. Electronically Signed   By: Genevie Ann M.D.   On: 06/04/2015 11:19   Dg Shoulder Left  06/04/2015  CLINICAL DATA:  [place] today and injured left shoulder. EXAM: LEFT SHOULDER - 2+ VIEW COMPARISON:  None. FINDINGS: There is a minimally displaced greater tuberosity fracture and also a probable  nondisplaced humeral neck fracture with slight impaction. The glenohumeral joint is maintained. Moderate AC joint degenerative changes. The visualized left ribs are intact. The left lung is clear. IMPRESSION: Minimally displaced humeral head and neck fractures. Electronically Signed   By: Marijo Sanes M.D.   On: 06/04/2015 11:10   Dg Knee Complete 4 Views Right  06/04/2015  CLINICAL DATA:  Fall today.  Right knee pain. EXAM: RIGHT KNEE - COMPLETE 4+ VIEW COMPARISON:  None. FINDINGS: No fracture.  No bone lesion. Prominent calcification is noted along the menisci. Minor joint space narrowing and minimal marginal osteophytes. No other arthropathic change. Minimal joint effusion suggested on the lateral view. Soft tissues otherwise unremarkable. IMPRESSION: 1. No fracture or acute finding. 2. Chondrocalcinosis and minor degenerative change. Probable minimal joint effusion. Electronically Signed   By: Lajean Manes M.D.   On: 06/04/2015 13:54   I have personally reviewed and evaluated these images and lab results as part of my medical decision-making.   EKG Interpretation   Date/Time:  Friday June 04 2015 10:01:34 EST Ventricular Rate:  64 PR Interval:  189 QRS Duration: 71 QT Interval:  522 QTC Calculation: 539 R Axis:   32 Text Interpretation:  Sinus rhythm Nonspecific T abnormalities, lateral  leads Prolonged QT interval since last tracing no significant change  Confirmed by Wali Reinheimer  MD, Josslin Sanjuan ([id number]) on 06/04/2015 10:35:01 AM      MDM   Final diagnoses:  Humerus fracture, left, closed, initial encounter  Laceration, eyelid, left, initial encounter  Minor head injury, initial encounter    Further information obtained from eye witness upstairs who saw the patient lose her balance in the hallway when her cane slipped on the floor, she fell striking her right shoulder and bumping her head against the railing. Her labs are overall unremarkable, her imaging does show a slight fracture of  the left humerus proximal, this was discussed with the family, sling will be ordered, laceration will be repaired, the patient appears hemodynamically completely stable.  xrays neg for frx of the knee and head - has fracture of humerus   F/u with ortho / PCP for suture removal.  Family in agreeement - she will go to stay with daughter.  I personally performed the services described in this documentation,  which was scribed in my presence. The recorded information has been reviewed and is accurate.   LACERATION REPAIR Performed by: Johnna Acosta Authorized by: Johnna Acosta Consent: Verbal consent obtained. Risks and benefits: risks, benefits and alternatives were discussed Consent given by: patient Patient identity confirmed: provided demographic data Prepped and Draped in normal sterile fashion Wound explored  Laceration Location: eyelid laceration  Laceration Length: 1 cm  No Foreign Bodies seen or palpated  Anesthesia: local infiltration  Local anesthetic: lidocaine 1% with epinephrine  Anesthetic total: 0.5 ml  Irrigation method: syringe Amount of cleaning: standard  Skin closure: 6-0 prole4ne  Number of sutures: 2  Technique: Simple Interrupted  Patient tolerance: Patient tolerated the procedure well with no immediate complications.   Meds given in ED:  Medications  fentaNYL (SUBLIMAZE) injection 50 mcg (50 mcg Intravenous Given 06/04/15 1026)  lidocaine-EPINEPHrine (XYLOCAINE-EPINEPHrine) 1 %-1:200000 (PF) injection 10 mL (10 mLs Other Given by Other 06/04/15 1153)    Discharge Medication List as of 06/04/2015  2:36 PM    START taking these medications   Details  acetaminophen-codeine (TYLENOL #3) 300-30 MG tablet Take 1-2 tablets by mouth every 6 (six) hours as needed for moderate pain., Starting 06/04/2015, Until Discontinued, Print    bacitracin ointment Apply 1 application topically 2 (two) times daily., Starting 06/04/2015, Until Discontinued, Print            Noemi Chapel, MD 06/04/15 1438  Noemi Chapel, MD 06/05/15 1214

## 2015-06-04 NOTE — ED Notes (Signed)
Pt made aware to return if symptoms worsen or if any life threatening symptoms occur.   

## 2015-06-04 NOTE — Discharge Instructions (Signed)
See your doctor for suture removal in 7 days (2 stitches) Call the orthopedic doctor for recheck in next week - use sling at all times.  Please obtain all of your results from medical records or have your doctors office obtain the results - share them with your doctor - you should be seen at your doctors office in the next 2 days. Call today to arrange your follow up. Take the medications as prescribed. Please review all of the medicines and only take them if you do not have an allergy to them. Please be aware that if you are taking birth control pills, taking other prescriptions, ESPECIALLY ANTIBIOTICS may make the birth control ineffective - if this is the case, either do not engage in sexual activity or use alternative methods of birth control such as condoms until you have finished the medicine and your family doctor says it is OK to restart them. If you are on a blood thinner such as COUMADIN, be aware that any other medicine that you take may cause the coumadin to either work too much, or not enough - you should have your coumadin level rechecked in next 7 days if this is the case.  ?  It is also a possibility that you have an allergic reaction to any of the medicines that you have been prescribed - Everybody reacts differently to medications and while MOST people have no trouble with most medicines, you may have a reaction such as nausea, vomiting, rash, swelling, shortness of breath. If this is the case, please stop taking the medicine immediately and contact your physician.  ?  You should return to the ER if you develop severe or worsening symptoms.

## 2015-06-05 ENCOUNTER — Other Ambulatory Visit: Payer: Self-pay | Admitting: Family Medicine

## 2015-06-05 DIAGNOSIS — S42202P Unspecified fracture of upper end of left humerus, subsequent encounter for fracture with malunion: Secondary | ICD-10-CM

## 2015-06-07 ENCOUNTER — Encounter: Payer: Self-pay | Admitting: Family Medicine

## 2015-06-07 ENCOUNTER — Ambulatory Visit (INDEPENDENT_AMBULATORY_CARE_PROVIDER_SITE_OTHER): Payer: Medicare Other | Admitting: Family Medicine

## 2015-06-07 ENCOUNTER — Ambulatory Visit: Payer: PRIVATE HEALTH INSURANCE | Admitting: "Endocrinology

## 2015-06-07 ENCOUNTER — Other Ambulatory Visit: Payer: Self-pay | Admitting: Family Medicine

## 2015-06-07 VITALS — BP 124/74 | HR 89 | Resp 16

## 2015-06-07 DIAGNOSIS — S0230XS Fracture of orbital floor, unspecified side, sequela: Secondary | ICD-10-CM | POA: Diagnosis not present

## 2015-06-07 DIAGNOSIS — F919 Conduct disorder, unspecified: Secondary | ICD-10-CM

## 2015-06-07 DIAGNOSIS — S4292XA Fracture of left shoulder girdle, part unspecified, initial encounter for closed fracture: Secondary | ICD-10-CM | POA: Insufficient documentation

## 2015-06-07 DIAGNOSIS — M6281 Muscle weakness (generalized): Secondary | ICD-10-CM | POA: Diagnosis not present

## 2015-06-07 DIAGNOSIS — R296 Repeated falls: Secondary | ICD-10-CM

## 2015-06-07 DIAGNOSIS — Z9181 History of falling: Secondary | ICD-10-CM

## 2015-06-07 DIAGNOSIS — S0230XA Fracture of orbital floor, unspecified side, initial encounter for closed fracture: Secondary | ICD-10-CM | POA: Insufficient documentation

## 2015-06-07 DIAGNOSIS — R4689 Other symptoms and signs involving appearance and behavior: Secondary | ICD-10-CM | POA: Insufficient documentation

## 2015-06-07 DIAGNOSIS — R42 Dizziness and giddiness: Secondary | ICD-10-CM

## 2015-06-07 DIAGNOSIS — S4292XK Fracture of left shoulder girdle, part unspecified, subsequent encounter for fracture with nonunion: Secondary | ICD-10-CM

## 2015-06-07 LAB — POCT URINALYSIS DIPSTICK
GLUCOSE UA: NEGATIVE
Ketones, UA: NEGATIVE
LEUKOCYTES UA: NEGATIVE
NITRITE UA: NEGATIVE
PH UA: 5.5
Protein, UA: 30
Spec Grav, UA: 1.025
UROBILINOGEN UA: 0.2

## 2015-06-07 NOTE — Progress Notes (Signed)
   Subjective:    P ID: Kristy Jarvis, female    DOB: April 07, 1921, 82 y.o.   MRN: [id number]  HPI Pt in for Ed follow up 3 days ago she unfortunately sustained a 2nd fall in less than 1 year and has a fractured humerus, and significant left periorbital swelling and bruising. C/o excessive sleepiuness, and also some blurry left vision and drainage She was visiting an ill person in the hospital when she fell, exact mechanism unclear Daughter concerned as pt not eating much nd is obviously weakening  Review of Systems See HPI Denies recent fever or chills. Denies sinus pressure, nasal congestion, ear pain or sore throat. Denies chest congestion, productive cough or wheezing. Denies chest pains, palpitations and leg swelling Denies abdominal pain, nausea, vomiting,diarrhea or constipation.   Denies dysuria, frequency, hesitancy or incontince Denies headaches, seizures, numbness, or tingling. Denies depression, anxiety or insomnia. Denies skin break down or rash.        Objective:   Physical Exam BP 124/74 mmHg  Pulse 89  Resp 16  SpO2 99% Patient alert and oriented and in no cardiopulmonary distress.  HEENT: Nasal assymetry, left periorbital swelling and bruising also drainage from left eye noted  Chest: Clear to auscultation bilaterally.  CVS: S1, S2 no murmurs, no S3.Regular rate.  ABD: Soft non tender.   Ext: No edema  MS:  Decreased ROM spine, shoulders, hips and knees.left arm i supportive sling  Skin: Intact, no ulcerations or rash noted.  Psych: Good eye contact, normal affect. Memory intact not anxious or depressed appearing.  CNS: CN 2-12 intact, power,  normal throughout.no focal deficits noted.        Assessment & Plan:  At high risk for falls 2  Falls resulting o in fractures in past 12 months, needs PT for balance and strengthening Fall precautions reviewed, she uses cane most of the time  Behavioral change Behavioral change noted following  direct head trauma and orbital fracture, needs  further imaging  Closed fracture of orbital floor ([place]) Refer to opthalmology due to reported blurry vision also because of risk of superinfection, started on doxycycline   Shoulder fracture, left Ortho to eval and manage soonest available appt, currently has on sling, management likely to be conservative based on pt's age and overall condition

## 2015-06-07 NOTE — Patient Instructions (Addendum)
F/u in 6 weeks, call if you need me sooner  Use tylenol liquid 500 mg dose up to 3 3 times daily for pain, this is over the counter ask pharmacist to help  Commit to regular eating and fluid intake so you  can get stronger  Call next week for PT if still weak Labs today  CaRE not to fall

## 2015-06-08 ENCOUNTER — Ambulatory Visit (INDEPENDENT_AMBULATORY_CARE_PROVIDER_SITE_OTHER): Payer: Medicare Other | Admitting: Orthopedic Surgery

## 2015-06-08 ENCOUNTER — Encounter: Payer: Self-pay | Admitting: Orthopedic Surgery

## 2015-06-08 VITALS — BP 111/64 | Ht 60.0 in | Wt 102.0 lb

## 2015-06-08 DIAGNOSIS — S42202A Unspecified fracture of upper end of left humerus, initial encounter for closed fracture: Secondary | ICD-10-CM

## 2015-06-08 LAB — DIFFERENTIAL
BASOS ABS: 0.1 10*3/uL (ref 0.0–0.1)
Basophils Relative: 0 % (ref 0–1)
EOS PCT: 2 % (ref 0–5)
Eosinophils Absolute: 0.2 10*3/uL (ref 0.0–0.7)
LYMPHS ABS: 3.2 10*3/uL (ref 0.7–4.0)
LYMPHS PCT: 23 % (ref 12–46)
Monocytes Absolute: 1 10*3/uL (ref 0.1–1.0)
Monocytes Relative: 7 % (ref 3–12)
NEUTROS ABS: 9.4 10*3/uL — AB (ref 1.7–7.7)
NEUTROS PCT: 68 % (ref 43–77)

## 2015-06-08 LAB — CBC
HEMATOCRIT: 43 % (ref 36.0–46.0)
Hemoglobin: 14.6 g/dL (ref 12.0–15.0)
MCH: 30.7 pg (ref 26.0–34.0)
MCHC: 34 g/dL (ref 30.0–36.0)
MCV: 90.5 fL (ref 78.0–100.0)
MPV: 11.8 fL (ref 8.6–12.4)
PLATELETS: 276 10*3/uL (ref 150–400)
RBC: 4.75 MIL/uL (ref 3.87–5.11)
RDW: 13.9 % (ref 11.5–15.5)
WBC: 14 10*3/uL — ABNORMAL HIGH (ref 4.0–10.5)

## 2015-06-08 LAB — COMPREHENSIVE METABOLIC PANEL
ALBUMIN: 3.7 g/dL (ref 3.6–5.1)
ALT: 14 U/L (ref 6–29)
AST: 22 U/L (ref 10–35)
Alkaline Phosphatase: 62 U/L (ref 33–130)
BILIRUBIN TOTAL: 1 mg/dL (ref 0.2–1.2)
BUN: 27 mg/dL — ABNORMAL HIGH (ref 7–25)
CHLORIDE: 102 mmol/L (ref 98–110)
CO2: 22 mmol/L (ref 20–31)
CREATININE: 0.93 mg/dL — AB (ref 0.60–0.88)
Calcium: 9.4 mg/dL (ref 8.6–10.4)
GLUCOSE: 94 mg/dL (ref 65–99)
Potassium: 4.2 mmol/L (ref 3.5–5.3)
SODIUM: 140 mmol/L (ref 135–146)
Total Protein: 6.5 g/dL (ref 6.1–8.1)

## 2015-06-08 LAB — CK: Total CK: 48 U/L (ref 7–177)

## 2015-06-08 LAB — MAGNESIUM: Magnesium: 2.2 mg/dL (ref 1.5–2.5)

## 2015-06-08 NOTE — Patient Instructions (Signed)
Sling x 3 weeks

## 2015-06-08 NOTE — Progress Notes (Signed)
Patient ID: [name], female   DOB: 09-30-1920, 82 y.o.   MRN: [id number]  Chief Complaint  Patient presents with  . Shoulder Injury    er follow up Left shoulder fx, DOI 06/05/15    HPI Kristy Jarvis is a 82 y.o. female.  The patient fell on some water while visiting a sick friend at the hospital HPI Left shoulder. Aching pain left shoulder moderate to severe 4 days duration constant improved with immobilization Review of Systems Review of Systems  Constitutional symptoms negative neurologic symptoms negative Past Medical History  Diagnosis Date  . Intermittent vertigo   . Renal colic   . Osteoporosis   . Hyperlipidemia   . GERD (gastroesophageal reflux disease)   . Diverticulosis of colon     by CT  . PONV (postoperative nausea and vomiting)   . Cataract 2013    right, no surgery at this time    Past Surgical History  Procedure Laterality Date  . Cataract extraction  2002    left eye   . Cholecystectomy  1989  . Abdominal hysterectomy    . Esophagogastroduodenoscopy  Jan  2007    prominent schatzki's ring, s/p 82F, small to moderate size hh  . Bunionectomy  06/27/2011    left , Dr Berline Lopes  . Eye surgery  2005 approx    cataract left  . Intramedullary (im) nail intertrochanteric Left 01/02/2014    Procedure: INTRAMEDULLARY (IM) NAIL INTERTROCHANTRIC;  Surgeon: Newt Minion, MD;  Location: [facility];  Service: Orthopedics;  Laterality: Left;  . Hip surgery Left 12/20/2013    Family History  Problem Relation Age of Onset  . Heart attack Father   . Liver cancer      family history   . Colon cancer Neg Hx   . Anesthesia problems Neg Hx   . Hypotension Neg Hx   . Malignant hyperthermia Neg Hx   . Pseudochol deficiency Neg Hx   . Diabetes Sister     Social History Social History  Substance Use Topics  . Smoking status: Never Smoker   . Smokeless tobacco: Not on file  . Alcohol Use: No     Current Outpatient Prescriptions  Medication Sig Dispense  Refill  . calcium carbonate (TUMS EX) 750 MG chewable tablet Chew 2 tablets by mouth daily.     . ergocalciferol (VITAMIN D2) 50000 units capsule Take 50,000 Units by mouth once a week.    . lovastatin (MEVACOR) 40 MG tablet TAKE (1) TABLET BY MOUTH AT BEDTIME. 30 tablet 3  . omeprazole (PRILOSEC) 20 MG capsule TAKE (1) CAPSULE BY MOUTH AT BEDTIME. 30 capsule 4  . acetaminophen (TYLENOL) 500 MG tablet Take 1 tablet (500 mg total) by mouth every 6 (six) hours as needed for mild pain. 30 tablet 0  . acetaminophen-codeine (TYLENOL #3) 300-30 MG tablet Take 1-2 tablets by mouth every 6 (six) hours as needed for moderate pain. (Patient not taking: Reported on 06/07/2015) 15 tablet 0  . aspirin 81 MG tablet Take 81 mg by mouth daily. Reported on 06/07/2015    . bacitracin ointment Apply 1 application topically 2 (two) times daily. 120 g 0  . Cholecalciferol (VITAMIN D) 400 UNITS capsule Take 800 Units by mouth daily.     . ranitidine (ZANTAC) 150 MG capsule Take 1 capsule (150 mg total) by mouth 2 (two) times daily. 60 capsule 4   No current facility-administered medications for this visit.       Physical  Exam Blood pressure 111/64, height 5' (1.524 m), weight 102 lb (46.267 kg). Physical Exam The patient is well developed well nourished and well groomed.  Orientation to person place and time is normal  Mood is pleasant.  Ambulatory status ambulation without assistive device Cervical spine exam is as follows nontender  Left shoulder  Examination: Inspection of the shoulder shows that there is ecchymosis of the skin proximal humerus down into the upper arm. Tenderness at the proximal humerus and fracture site is noted. Range of motion examination is deferred because of pain. Motor exam hand wrist elbow normal including normal muscle tone. Shoulder stability tests deferred because of fracture elbow stable wrist stable.  Neurovascular examination is intact and the lymph nodes in the axilla and  supraclavicular regions are normal    Data Reviewed  independent image interpretation :  Left proximal humerus fracture with impaction greater tuberosity is now slightly higher than the humeral head  Assessment    Left Proximal humerus fracture    Plan    Sling-and-swathe for 3 weeks followed by x-ray of the shoulder

## 2015-06-09 ENCOUNTER — Telehealth: Payer: Self-pay | Admitting: Radiology

## 2015-06-09 ENCOUNTER — Other Ambulatory Visit: Payer: Self-pay | Admitting: Family Medicine

## 2015-06-09 DIAGNOSIS — S02402A Zygomatic fracture, unspecified, initial encounter for closed fracture: Secondary | ICD-10-CM | POA: Diagnosis not present

## 2015-06-09 MED ORDER — DOXYCYCLINE HYCLATE 100 MG PO TABS: 100.0000 mg | ORAL_TABLET | Freq: Two times a day (BID) | ORAL | Status: DC

## 2015-06-09 NOTE — Telephone Encounter (Signed)
Daughter states they decided when seen yesterday in the office to just take tylenol but she would like to have something stronger like you discussed, please advise. [facility].

## 2015-06-10 ENCOUNTER — Other Ambulatory Visit: Payer: Self-pay | Admitting: *Deleted

## 2015-06-10 MED ORDER — ACETAMINOPHEN-CODEINE #3 300-30 MG PO TABS: 1.0000 | ORAL_TABLET | Freq: Four times a day (QID) | ORAL | Status: DC | PRN

## 2015-06-10 NOTE — Telephone Encounter (Signed)
Medication sent to pharmacy as requested, daughter aware

## 2015-06-10 NOTE — Telephone Encounter (Signed)
TYL 3 1 Q 6 30 TABS

## 2015-06-17 ENCOUNTER — Encounter: Payer: Self-pay | Admitting: *Deleted

## 2015-06-18 ENCOUNTER — Ambulatory Visit (HOSPITAL_COMMUNITY)
Admission: RE | Admit: 2015-06-18 | Discharge: 2015-06-18 | Disposition: A | Discharge status: Home / Self Care | Payer: Medicare Other | Source: Ambulatory Visit | Attending: Family Medicine | Admitting: Family Medicine

## 2015-06-18 DIAGNOSIS — R4689 Other symptoms and signs involving appearance and behavior: Secondary | ICD-10-CM

## 2015-06-25 ENCOUNTER — Ambulatory Visit (HOSPITAL_COMMUNITY)
Admission: RE | Admit: 2015-06-25 | Discharge: 2015-06-25 | Disposition: A | Discharge status: Home / Self Care | Payer: Medicare Other | Source: Ambulatory Visit | Attending: Family Medicine | Admitting: Family Medicine

## 2015-06-25 ENCOUNTER — Encounter: Payer: Self-pay | Admitting: Family Medicine

## 2015-06-25 DIAGNOSIS — F919 Conduct disorder, unspecified: Secondary | ICD-10-CM | POA: Diagnosis present

## 2015-06-25 DIAGNOSIS — I739 Peripheral vascular disease, unspecified: Secondary | ICD-10-CM | POA: Insufficient documentation

## 2015-06-25 DIAGNOSIS — S0292XK Unspecified fracture of facial bones, subsequent encounter for fracture with nonunion: Secondary | ICD-10-CM | POA: Insufficient documentation

## 2015-06-25 DIAGNOSIS — S0292XA Unspecified fracture of facial bones, initial encounter for closed fracture: Secondary | ICD-10-CM | POA: Diagnosis not present

## 2015-07-02 ENCOUNTER — Ambulatory Visit: Payer: Medicare Other | Admitting: Orthopedic Surgery

## 2015-07-05 ENCOUNTER — Other Ambulatory Visit: Payer: Self-pay | Admitting: Family Medicine

## 2015-07-05 DIAGNOSIS — Z9181 History of falling: Secondary | ICD-10-CM | POA: Insufficient documentation

## 2015-07-05 NOTE — Assessment & Plan Note (Signed)
2  Falls resulting o in fractures in past 12 months, needs PT for balance and strengthening Fall precautions reviewed, she uses cane most of the time

## 2015-07-05 NOTE — Assessment & Plan Note (Signed)
Behavioral change noted following direct head trauma and orbital fracture, needs  further imaging

## 2015-07-05 NOTE — Assessment & Plan Note (Signed)
Ortho to eval and manage soonest available appt, currently has on sling, management likely to be conservative based on pt's age and overall condition

## 2015-07-05 NOTE — Assessment & Plan Note (Addendum)
Refer to opthalmology due to reported blurry vision also because of risk of superinfection, started on doxycycline

## 2015-07-06 ENCOUNTER — Encounter: Payer: Self-pay | Admitting: Orthopedic Surgery

## 2015-07-06 ENCOUNTER — Ambulatory Visit (HOSPITAL_COMMUNITY): Admission: RE | Admit: 2015-07-06 | Payer: Medicare Other | Source: Ambulatory Visit

## 2015-07-06 ENCOUNTER — Ambulatory Visit (INDEPENDENT_AMBULATORY_CARE_PROVIDER_SITE_OTHER): Payer: Medicare Other

## 2015-07-06 ENCOUNTER — Ambulatory Visit (INDEPENDENT_AMBULATORY_CARE_PROVIDER_SITE_OTHER): Payer: Self-pay | Admitting: Orthopedic Surgery

## 2015-07-06 VITALS — BP 141/42 | Ht 60.0 in | Wt 102.0 lb

## 2015-07-06 DIAGNOSIS — S4292XD Fracture of left shoulder girdle, part unspecified, subsequent encounter for fracture with routine healing: Secondary | ICD-10-CM | POA: Diagnosis not present

## 2015-07-06 NOTE — Addendum Note (Signed)
Addended by: Baldomero Lamy B on: 07/06/2015 12:33 PM   Modules accepted: Orders

## 2015-07-06 NOTE — Progress Notes (Signed)
82 year old female fell while visiting a sick friend in the hospital fractured her left proximal humerus. We treated her with sling-and-swathe for 3 weeks she's here today for follow-up x-ray  BP 141/42 mmHg  Ht 5' (1.524 m)  Wt 102 lb (46.267 kg)  BMI 19.92 kg/m2 Passive range of motion at the left shoulder external rotation with the arm at her side is 10 abduction 45 flexion 40  Start therapy on the left shoulder follow-up 8 weeks

## 2015-07-06 NOTE — Patient Instructions (Signed)
Start PT   Wear sling as needed for comfort

## 2015-07-08 ENCOUNTER — Ambulatory Visit: Payer: Medicare Other | Admitting: Orthopaedic Surgery

## 2015-07-14 DIAGNOSIS — R260 Ataxic gait: Secondary | ICD-10-CM | POA: Diagnosis not present

## 2015-07-14 DIAGNOSIS — Z9181 History of falling: Secondary | ICD-10-CM | POA: Diagnosis not present

## 2015-07-14 DIAGNOSIS — M6281 Muscle weakness (generalized): Secondary | ICD-10-CM | POA: Diagnosis not present

## 2015-07-14 DIAGNOSIS — W010XXD Fall on same level from slipping, tripping and stumbling without subsequent striking against object, subsequent encounter: Secondary | ICD-10-CM | POA: Diagnosis not present

## 2015-07-14 DIAGNOSIS — S4292XD Fracture of left shoulder girdle, part unspecified, subsequent encounter for fracture with routine healing: Secondary | ICD-10-CM | POA: Diagnosis not present

## 2015-07-16 DIAGNOSIS — Z9181 History of falling: Secondary | ICD-10-CM | POA: Diagnosis not present

## 2015-07-16 DIAGNOSIS — S4292XD Fracture of left shoulder girdle, part unspecified, subsequent encounter for fracture with routine healing: Secondary | ICD-10-CM | POA: Diagnosis not present

## 2015-07-16 DIAGNOSIS — W010XXD Fall on same level from slipping, tripping and stumbling without subsequent striking against object, subsequent encounter: Secondary | ICD-10-CM | POA: Diagnosis not present

## 2015-07-16 DIAGNOSIS — R260 Ataxic gait: Secondary | ICD-10-CM | POA: Diagnosis not present

## 2015-07-16 DIAGNOSIS — M6281 Muscle weakness (generalized): Secondary | ICD-10-CM | POA: Diagnosis not present

## 2015-07-19 ENCOUNTER — Telehealth: Payer: Self-pay | Admitting: *Deleted

## 2015-07-19 ENCOUNTER — Ambulatory Visit (INDEPENDENT_AMBULATORY_CARE_PROVIDER_SITE_OTHER): Payer: Medicare Other | Admitting: Family Medicine

## 2015-07-19 ENCOUNTER — Encounter: Payer: Self-pay | Admitting: Family Medicine

## 2015-07-19 VITALS — BP 138/76 | HR 82 | Resp 16 | Ht 60.0 in | Wt 101.0 lb

## 2015-07-19 DIAGNOSIS — K2100 Gastro-esophageal reflux disease with esophagitis, without bleeding: Secondary | ICD-10-CM

## 2015-07-19 DIAGNOSIS — R296 Repeated falls: Secondary | ICD-10-CM

## 2015-07-19 DIAGNOSIS — R739 Hyperglycemia, unspecified: Secondary | ICD-10-CM | POA: Diagnosis not present

## 2015-07-19 DIAGNOSIS — E559 Vitamin D deficiency, unspecified: Secondary | ICD-10-CM | POA: Diagnosis not present

## 2015-07-19 DIAGNOSIS — E785 Hyperlipidemia, unspecified: Secondary | ICD-10-CM

## 2015-07-19 DIAGNOSIS — M810 Age-related osteoporosis without current pathological fracture: Secondary | ICD-10-CM

## 2015-07-19 DIAGNOSIS — S0230XD Fracture of orbital floor, unspecified side, subsequent encounter for fracture with routine healing: Secondary | ICD-10-CM

## 2015-07-19 DIAGNOSIS — Z9181 History of falling: Secondary | ICD-10-CM

## 2015-07-19 NOTE — Assessment & Plan Note (Signed)
Facial swelling and asymetry have resolved, no drainage from left eye, has had eval by opthalmology also, no residual damage on exam

## 2015-07-19 NOTE — Assessment & Plan Note (Signed)
Daughter has to reschedule endo appt and will do so, treatment of her osteoporosis is important and she understands this

## 2015-07-19 NOTE — Assessment & Plan Note (Signed)
Controlled, no change in medication Encouraged increased food intake and more mealtime supervision as able by her daughter, now living in her own home on her own

## 2015-07-19 NOTE — Assessment & Plan Note (Signed)
Hyperlipidemia:Low fat diet discussed and encouraged.   Lipid Panel  Lab Results  Component Value Date   CHOL 174 12/08/2014   HDL 75 12/08/2014   LDLCALC 78 12/08/2014   LDLDIRECT 85 06/17/2012   TRIG 106 12/08/2014   CHOLHDL 2.3 12/08/2014   Updated lab needed at/ before next visit.

## 2015-07-19 NOTE — Patient Instructions (Addendum)
Annual wellness end July / early August, call if you need me sooner  Pls resched visit with Dr Dorris Fetch  No aspirin  Medication the same  Care not to fall again  Fasting labs end August  Eat regularly PLEASE

## 2015-07-19 NOTE — Telephone Encounter (Signed)
FYI ONLY  GAVE VERBAL OK TO ADD OT IN ADDITION TO PT

## 2015-07-19 NOTE — Telephone Encounter (Signed)
Joey from [facility] called wanting to ask Dr. Aline Brochure about PT on patient's left shoulder fx. Joey said they would like a order for it. Please advise [phone]

## 2015-07-19 NOTE — Assessment & Plan Note (Signed)
Has started physical therapy and occupational therapy to start soon also to improve balance and function Ambulates with her cane at all times outside of her home currently Fall risk reduction and home safety reviewed with both patient and her daughter

## 2015-07-19 NOTE — Progress Notes (Signed)
   Subjective:    Patient ID: Kristy Jarvis, female    DOB: 09-06-1920, 82 y.o.   MRN: [id number]  HPI   Kristy Jarvis     MRN: [id number]      DOB: May 31, 1920   HPI Kristy Jarvis is here for follow up and re-evaluation of chronic medical conditions, medication management and review of any available recent lab and radiology data.  Preventive health is updated, specifically  Cancer screening and Immunization.   Needs to reschedule endo appt, was evaluated by opthalmology, facial swelling and vision improved. Just starting therapy The PT denies any adverse reactions to current medications since the last visit.  There are no new concerns.  Overall improved, back home after staying with her daughter for 3 weeks  ROS Denies recent fever or chills. Denies sinus pressure, nasal congestion, ear pain or sore throat. Denies chest congestion, productive cough or wheezing. Denies chest pains, palpitations and leg swelling Denies abdominal pain, nausea, vomiting,diarrhea or constipation.   Denies dysuria, frequency, hesitancy or incontinence.  Denies headaches, seizures, numbness, or tingling. Denies depression, anxiety or insomnia. Denies skin break down or rash.   PE  BP 138/76 mmHg  Pulse 82  Resp 16  Ht 5' (1.524 m)  Wt 101 lb (45.813 kg)  BMI 19.73 kg/m2  SpO2 98%  Patient alert and oriented and in no cardiopulmonary distress.  HEENT: No facial asymmetry, EOMI,   oropharynx pink and moist.  Neck adequate though reduced ROM, no JVD, no mass.  Chest: Clear to auscultation bilaterally.  CVS: S1, S2 no murmurs, no S3.Regular rate.  ABD: Soft non tender.   Ext: No edema  MS: Adequate though decreased  ROM spine, shoulders, hips and knees.  Skin: Intact, no ulcerations or rash noted.  Psych: Good eye contact, normal affect. Memory intact not anxious or depressed appearing.  CNS: CN 2-12 intact, power,  normal throughout.no focal deficits noted.   Assessment &  Plan  At high risk for falls Has started physical therapy and occupational therapy to start soon also to improve balance and function Ambulates with her cane at all times outside of her home currently Fall risk reduction and home safety reviewed with both patient and her daughter  Osteoporosis Daughter has to reschedule endo appt and will do so, treatment of her osteoporosis is important and she understands this  Closed fracture of orbital floor (HCC) Facial swelling and asymetry have resolved, no drainage from left eye, has had eval by opthalmology also, no residual damage on exam  GERD Controlled, no change in medication Encouraged increased food intake and more mealtime supervision as able by her daughter, now living in her own home on her own  Hyperlipemia Hyperlipidemia:Low fat diet discussed and encouraged.   Lipid Panel  Lab Results  Component Value Date   CHOL 174 12/08/2014   HDL 75 12/08/2014   LDLCALC 78 12/08/2014   LDLDIRECT 85 06/17/2012   TRIG 106 12/08/2014   CHOLHDL 2.3 12/08/2014   Updated lab needed at/ before next visit.           Review of Systems     Objective:   Physical Exam        Assessment & Plan:

## 2015-07-20 DIAGNOSIS — M6281 Muscle weakness (generalized): Secondary | ICD-10-CM | POA: Diagnosis not present

## 2015-07-20 DIAGNOSIS — W010XXD Fall on same level from slipping, tripping and stumbling without subsequent striking against object, subsequent encounter: Secondary | ICD-10-CM | POA: Diagnosis not present

## 2015-07-20 DIAGNOSIS — S4292XD Fracture of left shoulder girdle, part unspecified, subsequent encounter for fracture with routine healing: Secondary | ICD-10-CM | POA: Diagnosis not present

## 2015-07-20 DIAGNOSIS — R260 Ataxic gait: Secondary | ICD-10-CM | POA: Diagnosis not present

## 2015-07-20 DIAGNOSIS — Z9181 History of falling: Secondary | ICD-10-CM | POA: Diagnosis not present

## 2015-07-22 DIAGNOSIS — M6281 Muscle weakness (generalized): Secondary | ICD-10-CM | POA: Diagnosis not present

## 2015-07-22 DIAGNOSIS — S4292XD Fracture of left shoulder girdle, part unspecified, subsequent encounter for fracture with routine healing: Secondary | ICD-10-CM | POA: Diagnosis not present

## 2015-07-22 DIAGNOSIS — Z9181 History of falling: Secondary | ICD-10-CM | POA: Diagnosis not present

## 2015-07-22 DIAGNOSIS — R260 Ataxic gait: Secondary | ICD-10-CM | POA: Diagnosis not present

## 2015-07-22 DIAGNOSIS — W010XXD Fall on same level from slipping, tripping and stumbling without subsequent striking against object, subsequent encounter: Secondary | ICD-10-CM | POA: Diagnosis not present

## 2015-07-23 DIAGNOSIS — S4292XD Fracture of left shoulder girdle, part unspecified, subsequent encounter for fracture with routine healing: Secondary | ICD-10-CM | POA: Diagnosis not present

## 2015-07-23 DIAGNOSIS — M6281 Muscle weakness (generalized): Secondary | ICD-10-CM | POA: Diagnosis not present

## 2015-07-23 DIAGNOSIS — R260 Ataxic gait: Secondary | ICD-10-CM | POA: Diagnosis not present

## 2015-07-23 DIAGNOSIS — Z9181 History of falling: Secondary | ICD-10-CM | POA: Diagnosis not present

## 2015-07-23 DIAGNOSIS — W010XXD Fall on same level from slipping, tripping and stumbling without subsequent striking against object, subsequent encounter: Secondary | ICD-10-CM | POA: Diagnosis not present

## 2015-07-26 ENCOUNTER — Telehealth: Payer: Self-pay | Admitting: Orthopedic Surgery

## 2015-07-26 DIAGNOSIS — W010XXD Fall on same level from slipping, tripping and stumbling without subsequent striking against object, subsequent encounter: Secondary | ICD-10-CM | POA: Diagnosis not present

## 2015-07-26 DIAGNOSIS — S4292XD Fracture of left shoulder girdle, part unspecified, subsequent encounter for fracture with routine healing: Secondary | ICD-10-CM | POA: Diagnosis not present

## 2015-07-26 DIAGNOSIS — M6281 Muscle weakness (generalized): Secondary | ICD-10-CM | POA: Diagnosis not present

## 2015-07-26 DIAGNOSIS — R260 Ataxic gait: Secondary | ICD-10-CM | POA: Diagnosis not present

## 2015-07-26 DIAGNOSIS — Z9181 History of falling: Secondary | ICD-10-CM | POA: Diagnosis not present

## 2015-07-26 NOTE — Telephone Encounter (Signed)
Send order for no restrictions  aarom and prom  PRE's as tolerated

## 2015-07-26 NOTE — Telephone Encounter (Signed)
Kristy Jarvis from [place] called for a verbal order for Occupational Therapy. She stated she needs to know if there are any restrictions or protocol to follow for patient's shoulder. Please call [phone] to advise.

## 2015-07-26 NOTE — Telephone Encounter (Signed)
ANY RESTRICTIONS?

## 2015-07-27 NOTE — Telephone Encounter (Signed)
RETURNED CALL, NO ANSWER, LEFT DETAILED VM

## 2015-07-28 DIAGNOSIS — M6281 Muscle weakness (generalized): Secondary | ICD-10-CM | POA: Diagnosis not present

## 2015-07-28 DIAGNOSIS — W010XXD Fall on same level from slipping, tripping and stumbling without subsequent striking against object, subsequent encounter: Secondary | ICD-10-CM | POA: Diagnosis not present

## 2015-07-28 DIAGNOSIS — S4292XD Fracture of left shoulder girdle, part unspecified, subsequent encounter for fracture with routine healing: Secondary | ICD-10-CM | POA: Diagnosis not present

## 2015-07-28 DIAGNOSIS — Z9181 History of falling: Secondary | ICD-10-CM | POA: Diagnosis not present

## 2015-07-28 DIAGNOSIS — R260 Ataxic gait: Secondary | ICD-10-CM | POA: Diagnosis not present

## 2015-07-30 DIAGNOSIS — S4292XD Fracture of left shoulder girdle, part unspecified, subsequent encounter for fracture with routine healing: Secondary | ICD-10-CM | POA: Diagnosis not present

## 2015-07-30 DIAGNOSIS — R260 Ataxic gait: Secondary | ICD-10-CM | POA: Diagnosis not present

## 2015-07-30 DIAGNOSIS — Z9181 History of falling: Secondary | ICD-10-CM | POA: Diagnosis not present

## 2015-07-30 DIAGNOSIS — M6281 Muscle weakness (generalized): Secondary | ICD-10-CM | POA: Diagnosis not present

## 2015-07-30 DIAGNOSIS — W010XXD Fall on same level from slipping, tripping and stumbling without subsequent striking against object, subsequent encounter: Secondary | ICD-10-CM | POA: Diagnosis not present

## 2015-08-02 DIAGNOSIS — S4292XD Fracture of left shoulder girdle, part unspecified, subsequent encounter for fracture with routine healing: Secondary | ICD-10-CM | POA: Diagnosis not present

## 2015-08-02 DIAGNOSIS — Z9181 History of falling: Secondary | ICD-10-CM | POA: Diagnosis not present

## 2015-08-02 DIAGNOSIS — R260 Ataxic gait: Secondary | ICD-10-CM | POA: Diagnosis not present

## 2015-08-02 DIAGNOSIS — W010XXD Fall on same level from slipping, tripping and stumbling without subsequent striking against object, subsequent encounter: Secondary | ICD-10-CM | POA: Diagnosis not present

## 2015-08-02 DIAGNOSIS — M6281 Muscle weakness (generalized): Secondary | ICD-10-CM | POA: Diagnosis not present

## 2015-08-03 DIAGNOSIS — W010XXD Fall on same level from slipping, tripping and stumbling without subsequent striking against object, subsequent encounter: Secondary | ICD-10-CM | POA: Diagnosis not present

## 2015-08-03 DIAGNOSIS — Z9181 History of falling: Secondary | ICD-10-CM | POA: Diagnosis not present

## 2015-08-03 DIAGNOSIS — S4292XD Fracture of left shoulder girdle, part unspecified, subsequent encounter for fracture with routine healing: Secondary | ICD-10-CM | POA: Diagnosis not present

## 2015-08-03 DIAGNOSIS — R260 Ataxic gait: Secondary | ICD-10-CM | POA: Diagnosis not present

## 2015-08-03 DIAGNOSIS — M6281 Muscle weakness (generalized): Secondary | ICD-10-CM | POA: Diagnosis not present

## 2015-08-04 DIAGNOSIS — R260 Ataxic gait: Secondary | ICD-10-CM | POA: Diagnosis not present

## 2015-08-04 DIAGNOSIS — W010XXD Fall on same level from slipping, tripping and stumbling without subsequent striking against object, subsequent encounter: Secondary | ICD-10-CM | POA: Diagnosis not present

## 2015-08-04 DIAGNOSIS — S4292XD Fracture of left shoulder girdle, part unspecified, subsequent encounter for fracture with routine healing: Secondary | ICD-10-CM | POA: Diagnosis not present

## 2015-08-04 DIAGNOSIS — Z9181 History of falling: Secondary | ICD-10-CM | POA: Diagnosis not present

## 2015-08-04 DIAGNOSIS — M6281 Muscle weakness (generalized): Secondary | ICD-10-CM | POA: Diagnosis not present

## 2015-08-05 DIAGNOSIS — Z9181 History of falling: Secondary | ICD-10-CM | POA: Diagnosis not present

## 2015-08-05 DIAGNOSIS — S4292XD Fracture of left shoulder girdle, part unspecified, subsequent encounter for fracture with routine healing: Secondary | ICD-10-CM | POA: Diagnosis not present

## 2015-08-05 DIAGNOSIS — M6281 Muscle weakness (generalized): Secondary | ICD-10-CM | POA: Diagnosis not present

## 2015-08-05 DIAGNOSIS — W010XXD Fall on same level from slipping, tripping and stumbling without subsequent striking against object, subsequent encounter: Secondary | ICD-10-CM | POA: Diagnosis not present

## 2015-08-05 DIAGNOSIS — R260 Ataxic gait: Secondary | ICD-10-CM | POA: Diagnosis not present

## 2015-08-09 DIAGNOSIS — W010XXD Fall on same level from slipping, tripping and stumbling without subsequent striking against object, subsequent encounter: Secondary | ICD-10-CM | POA: Diagnosis not present

## 2015-08-09 DIAGNOSIS — R260 Ataxic gait: Secondary | ICD-10-CM | POA: Diagnosis not present

## 2015-08-09 DIAGNOSIS — Z9181 History of falling: Secondary | ICD-10-CM | POA: Diagnosis not present

## 2015-08-09 DIAGNOSIS — M6281 Muscle weakness (generalized): Secondary | ICD-10-CM | POA: Diagnosis not present

## 2015-08-09 DIAGNOSIS — S4292XD Fracture of left shoulder girdle, part unspecified, subsequent encounter for fracture with routine healing: Secondary | ICD-10-CM | POA: Diagnosis not present

## 2015-08-10 DIAGNOSIS — M6281 Muscle weakness (generalized): Secondary | ICD-10-CM | POA: Diagnosis not present

## 2015-08-10 DIAGNOSIS — S4292XD Fracture of left shoulder girdle, part unspecified, subsequent encounter for fracture with routine healing: Secondary | ICD-10-CM | POA: Diagnosis not present

## 2015-08-10 DIAGNOSIS — W010XXD Fall on same level from slipping, tripping and stumbling without subsequent striking against object, subsequent encounter: Secondary | ICD-10-CM | POA: Diagnosis not present

## 2015-08-10 DIAGNOSIS — R260 Ataxic gait: Secondary | ICD-10-CM | POA: Diagnosis not present

## 2015-08-10 DIAGNOSIS — Z9181 History of falling: Secondary | ICD-10-CM | POA: Diagnosis not present

## 2015-08-11 ENCOUNTER — Telehealth: Payer: Self-pay | Admitting: *Deleted

## 2015-08-11 DIAGNOSIS — R260 Ataxic gait: Secondary | ICD-10-CM | POA: Diagnosis not present

## 2015-08-11 DIAGNOSIS — M6281 Muscle weakness (generalized): Secondary | ICD-10-CM | POA: Diagnosis not present

## 2015-08-11 DIAGNOSIS — Z9181 History of falling: Secondary | ICD-10-CM | POA: Diagnosis not present

## 2015-08-11 DIAGNOSIS — S4292XD Fracture of left shoulder girdle, part unspecified, subsequent encounter for fracture with routine healing: Secondary | ICD-10-CM | POA: Diagnosis not present

## 2015-08-11 DIAGNOSIS — W010XXD Fall on same level from slipping, tripping and stumbling without subsequent striking against object, subsequent encounter: Secondary | ICD-10-CM | POA: Diagnosis not present

## 2015-08-11 NOTE — Telephone Encounter (Signed)
Kristy Jarvis, with [place] request verbal order for continuation of therapy twice a week for 1 more week. Is it ok to give verbal order? [phone]

## 2015-08-12 DIAGNOSIS — S4292XD Fracture of left shoulder girdle, part unspecified, subsequent encounter for fracture with routine healing: Secondary | ICD-10-CM | POA: Diagnosis not present

## 2015-08-12 DIAGNOSIS — R260 Ataxic gait: Secondary | ICD-10-CM | POA: Diagnosis not present

## 2015-08-12 DIAGNOSIS — W010XXD Fall on same level from slipping, tripping and stumbling without subsequent striking against object, subsequent encounter: Secondary | ICD-10-CM | POA: Diagnosis not present

## 2015-08-12 DIAGNOSIS — Z9181 History of falling: Secondary | ICD-10-CM | POA: Diagnosis not present

## 2015-08-12 DIAGNOSIS — M6281 Muscle weakness (generalized): Secondary | ICD-10-CM | POA: Diagnosis not present

## 2015-08-12 NOTE — Telephone Encounter (Signed)
Verbal order given to [facility].

## 2015-08-12 NOTE — Telephone Encounter (Signed)
yes

## 2015-08-16 DIAGNOSIS — M6281 Muscle weakness (generalized): Secondary | ICD-10-CM | POA: Diagnosis not present

## 2015-08-16 DIAGNOSIS — S4292XD Fracture of left shoulder girdle, part unspecified, subsequent encounter for fracture with routine healing: Secondary | ICD-10-CM | POA: Diagnosis not present

## 2015-08-16 DIAGNOSIS — R260 Ataxic gait: Secondary | ICD-10-CM | POA: Diagnosis not present

## 2015-08-16 DIAGNOSIS — Z9181 History of falling: Secondary | ICD-10-CM | POA: Diagnosis not present

## 2015-08-16 DIAGNOSIS — W010XXD Fall on same level from slipping, tripping and stumbling without subsequent striking against object, subsequent encounter: Secondary | ICD-10-CM | POA: Diagnosis not present

## 2015-08-18 DIAGNOSIS — S4292XD Fracture of left shoulder girdle, part unspecified, subsequent encounter for fracture with routine healing: Secondary | ICD-10-CM | POA: Diagnosis not present

## 2015-08-18 DIAGNOSIS — Z9181 History of falling: Secondary | ICD-10-CM | POA: Diagnosis not present

## 2015-08-18 DIAGNOSIS — M6281 Muscle weakness (generalized): Secondary | ICD-10-CM | POA: Diagnosis not present

## 2015-08-18 DIAGNOSIS — W010XXD Fall on same level from slipping, tripping and stumbling without subsequent striking against object, subsequent encounter: Secondary | ICD-10-CM | POA: Diagnosis not present

## 2015-08-18 DIAGNOSIS — R260 Ataxic gait: Secondary | ICD-10-CM | POA: Diagnosis not present

## 2015-08-20 ENCOUNTER — Telehealth: Payer: Self-pay | Admitting: Orthopedic Surgery

## 2015-08-20 NOTE — Telephone Encounter (Signed)
[name] from [place] called and stated that Kristy Jarvis has been discontinued from home health PT.  She stated that she felt [name] would benefit from outpatient PT.

## 2015-08-24 NOTE — Telephone Encounter (Signed)
Ok to order outpatient therapy for patient?

## 2015-08-24 NOTE — Telephone Encounter (Signed)
Yes   2x a week x 4 weeks

## 2015-08-25 ENCOUNTER — Other Ambulatory Visit: Payer: Self-pay | Admitting: *Deleted

## 2015-08-25 DIAGNOSIS — S4291XD Fracture of right shoulder girdle, part unspecified, subsequent encounter for fracture with routine healing: Secondary | ICD-10-CM

## 2015-08-25 NOTE — Telephone Encounter (Signed)
Ordered therapy at [facility]. Left message for patient to call and get the number to PT to set up appointment

## 2015-09-03 ENCOUNTER — Encounter (HOSPITAL_COMMUNITY): Payer: Self-pay | Admitting: Occupational Therapy

## 2015-09-03 ENCOUNTER — Ambulatory Visit (HOSPITAL_COMMUNITY): Payer: Medicare Other | Attending: Orthopedic Surgery | Admitting: Occupational Therapy

## 2015-09-03 DIAGNOSIS — M25612 Stiffness of left shoulder, not elsewhere classified: Secondary | ICD-10-CM | POA: Diagnosis not present

## 2015-09-03 DIAGNOSIS — M25512 Pain in left shoulder: Secondary | ICD-10-CM

## 2015-09-03 DIAGNOSIS — R29898 Other symptoms and signs involving the musculoskeletal system: Secondary | ICD-10-CM

## 2015-09-03 DIAGNOSIS — M6281 Muscle weakness (generalized): Secondary | ICD-10-CM | POA: Diagnosis present

## 2015-09-03 NOTE — Therapy (Addendum)
[facility], [place], [address] Phone: [phone]   Fax:  [phone]  Occupational Therapy Evaluation  Patient Details  Name: Kristy Jarvis MRN: [id number] Date of Birth: Jun 05, 1920 Referring Provider: Dr. Arther Abbott  Encounter Date: 09/03/2015      OT End of Session - 09/03/15 1238    Visit Number 1   Number of Visits 16   Date for OT Re-Evaluation 11/02/15  Mini reassess 10/01/15   Authorization Type [employer] Medicare   Authorization Time Period Before 10th visit   Authorization - Visit Number 1   Authorization - Number of Visits 10   OT Start Time 1038   OT Stop Time 1112   OT Time Calculation (min) 34 min   Activity Tolerance Patient tolerated treatment well   Behavior During Therapy [facility] for tasks assessed/performed      Past Medical History  Diagnosis Date  . Intermittent vertigo   . Renal colic   . Osteoporosis   . Hyperlipidemia   . GERD (gastroesophageal reflux disease)   . Diverticulosis of colon     by CT  . PONV (postoperative nausea and vomiting)   . Cataract 2013    right, no surgery at this time    Past Surgical History  Procedure Laterality Date  . Cataract extraction  2002    left eye   . Cholecystectomy  1989  . Abdominal hysterectomy    . Esophagogastroduodenoscopy  Jan  2007    prominent schatzki's ring, s/p 55F, small to moderate size hh  . Bunionectomy  06/27/2011    left , Dr Berline Lopes  . Eye surgery  2005 approx    cataract left  . Intramedullary (im) nail intertrochanteric Left 01/02/2014    Procedure: INTRAMEDULLARY (IM) NAIL INTERTROCHANTRIC;  Surgeon: Newt Minion, MD;  Location: [facility];  Service: Orthopedics;  Laterality: Left;  . Hip surgery Left 12/20/2013    There were no vitals filed for this visit.      Subjective Assessment - 09/03/15 1234    Subjective  S: I've been trying to lift my arm like this. (linking hands & lifting arms together)   Patient is  accompained by: Family member  daughter   Pertinent History Pt is a 82 y/o female s/p left shoulder fracture on 06/04/2015 when she fell on the wet floor at the hospital getting off the elevator to go visit someone. Pt received HHPT and HHOT services until approximately 2 weeks ago. Pt was referred to occupational therapy for evaluation and treatment by Dr. Arther Abbott.    Special Tests FOTO Score: 46/100 (54% impairment)   Patient Stated Goals To be able to use my left arm again   Currently in Pain? No/denies           [facility] OT Assessment - 09/03/15 1041    Assessment   Diagnosis Left shoulder fx   Referring Provider Dr. Arther Abbott   Onset Date 06/04/15   Prior Therapy [facility] PT   Precautions   Precautions None   Restrictions   Weight Bearing Restrictions No   Balance Screen   Has the patient fallen in the past 6 months Yes   How many times? 1   Has the patient had a decrease in activity level because of a fear of falling?  No   Is the patient reluctant to leave their home because of a fear of falling?  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   ADL   ADL comments Pt has difficulty with dressing, bathing, washing and fixing hair, reaching into cabinets, completing housework and yardwork, Aeronautical engineer Expression   Dominant Hand Right   Cognition   Overall Cognitive Status Within Functional Limits for tasks assessed   ROM / Strength   AROM / PROM / Strength AROM;PROM;Strength   Palpation   Palpation comment Max fascial restrictions in left upper arm, deltoid, trapezius, and scapularis regions   AROM   Overall AROM Comments Assessed seated, ER/IR adducted   AROM Assessment Site Shoulder    Right/Left Shoulder Left   Left Shoulder Flexion 76 Degrees   Left Shoulder ABduction 58 Degrees   Left Shoulder Internal Rotation 90 Degrees   Left Shoulder External Rotation 34 Degrees   PROM   Overall PROM Comments Assessed supine, ER/IR adducted   PROM Assessment Site Shoulder   Right/Left Shoulder Left   Left Shoulder Flexion 77 Degrees   Left Shoulder ABduction 75 Degrees   Left Shoulder Internal Rotation 90 Degrees   Left Shoulder External Rotation 45 Degrees   Strength   Overall Strength Comments Assessed seated, ER/IR adducted    Strength Assessment Site Shoulder   Right/Left Shoulder Left   Left Shoulder Flexion 3-/5   Left Shoulder ABduction 3-/5   Left Shoulder Internal Rotation 3/5   Left Shoulder External Rotation 3/5                         OT Education - 09/03/15 1238    Education provided Yes   Education Details table slides   Person(s) Educated Patient;Child(ren)   Methods Explanation;Demonstration;Handout   Comprehension Verbalized understanding;Returned demonstration          OT Short Term Goals - 09/03/15 1242    OT SHORT TERM GOAL #1   Title Pt will be educated on HEP.    Time 4   Period Weeks   Status New   OT SHORT TERM GOAL #2   Title Pt will decrease pain in LUE to 5/10 or less to increase ability to complete daily tasks using LUE as assist.    Time 4   Period Weeks   Status New   OT SHORT TERM GOAL #3   Title Pt will decrease fascial restictions in the LUE to mod amounts or less to increase mobility during manual stretching.    Time 4   Period Weeks   Status New   OT SHORT TERM GOAL #4   Title Pt will increase P/ROM to [facility] to increase ability to use LUE as assist when donning shirts.    Time 4   Period Weeks   Status New   OT SHORT TERM GOAL #5   Title Pt will increase strength in LUE to 3+/5 to increase ability to use LUE as assist when preparing meals.    Time 4   Period Weeks   Status New           OT  Long Term Goals - 09/03/15 1245    OT LONG TERM GOAL #1   Title Pt will return to prior level of functioning and independence in daily tasks.    Time  8   Period Weeks   Status New   OT LONG TERM GOAL #2   Title Pt will decrease pain in LUE to 2/10 or less to increase ability to complete functional tasks with LUE.    Time 8   Period Weeks   Status New   OT LONG TERM GOAL #3   Title Pt will decrease fascial restrictions in LUE to min amounts or less to increase mobility in the LUE during daily tasks.    Time 8   Period Weeks   Status New   OT LONG TERM GOAL #4   Title Pt will increase A/ROM to [facility] to increase ability to wash and roll her hair.    Time 8   Period Weeks   Status New   OT LONG TERM GOAL #5   Title Pt will increase strength in LUE to 4/5 to increase ability to push her wheelbarrow when picking up sticks in the yard.    Time 8   Period Weeks   Status New               Plan - 06-Sep-2015 1239    Clinical Impression Statement A: Pt is a 82 y/o female s/p left shoulder fx on 06/04/15 after falling on slick floors. Pt presents with increased fascial restrictions and pain, decreased range of motion and strength limiting functional use of LUE. Pt was provided with and educated on table slide HEP today.    Rehab Potential Good   OT Frequency 2x / week   OT Duration 8 weeks   OT Treatment/Interventions Self-care/ADL training;Passive range of motion;Patient/family education;Cryotherapy;Moist Heat;Therapeutic exercise;Manual Therapy;Therapeutic activities   Plan P: Pt will benefit from skilled OT services to decrease pain and fascial restrictions, increase range of motion, strength, and functional use of LUE during daily tasks. Treatment plan: Myofascial release, manual therapy, P/ROM, AA/ROM, A/ROM, scapular stability & strengthening, proximal shoulder strengthening, general LUE strengthening, patient education, modalities prn   OT Home Exercise Plan table slides   Consulted  and Agree with Plan of Care Patient      Patient will benefit from skilled therapeutic intervention in order to improve the following deficits and impairments:  Decreased strength, Pain, Impaired UE functional use, Increased fascial restricitons, Decreased range of motion, Impaired flexibility  Visit Diagnosis: Pain in left shoulder  Stiffness of left shoulder, not elsewhere classified  Other symptoms and signs involving the musculoskeletal system  Muscle weakness (generalized)      G-Codes - 09-06-2015 1248    Functional Assessment Tool Used FOTO Score: 46/100 (54% impairment)   Functional Limitation Carrying, moving and handling objects   Carrying, Moving and Handling Objects Current Status [id number]) At least 40 percent but less than 60 percent impaired, limited or restricted   Carrying, Moving and Handling Objects Goal Status [id number]) At least 20 percent but less than 40 percent impaired, limited or restricted      Problem List Patient Active Problem List   Diagnosis Date Noted  . At high risk for falls 07/05/2015  . Shoulder fracture, left 06/07/2015  . Closed fracture of orbital floor ([place]) 06/07/2015  . CKD (chronic kidney disease) stage 3, GFR 30-59 ml/min 05/24/2015  . Right kidney stone 02/22/2015  . Intertrochanteric fracture of left hip ([place]) 01/25/2014  . Hyperglycemia 03/25/2013  . MCI (mild cognitive impairment) 10/23/2012  . Esophageal dysphagia 08/31/2011  . Hyperlipemia 08/13/2007  . GERD 08/13/2007  . Osteoporosis 08/13/2007  . INTERMITTENT VERTIGO 08/13/2007    Magda Paganini  Narda Amber  [phone]  09/03/2015, 12:49 PM  [facility] [address] [place], [place], [address] Phone: [phone]   Fax:  [phone]  Name: Kristy Jarvis MRN: [id number] Date of Birth: 10/07/1920

## 2015-09-03 NOTE — Patient Instructions (Signed)
SHOULDER: Flexion On Table   Place hands on table, elbows straight. Move hips away from body. Press hands down into table. _10__ reps per set, _1-2__ sets per day  Abduction (Passive)   With arm out to side, resting on table, lower head toward arm, keeping trunk away from table. Repeat _10___ times. Do _1-2___ sessions per day.  Copyright  VHI. All rights reserved.     Internal Rotation (Assistive)   Seated with elbow bent at right angle and held against side, slide arm on table surface in an inward arc. Repeat _10___ times. Do _1-2___ sessions per day. Activity: Use this motion to brush crumbs off the table.  Copyright  VHI. All rights reserved.

## 2015-09-06 ENCOUNTER — Ambulatory Visit: Payer: Medicare Other | Admitting: Orthopedic Surgery

## 2015-09-06 VITALS — BP 147/73 | HR 79 | Ht 60.0 in | Wt 101.0 lb

## 2015-09-06 DIAGNOSIS — S4292XD Fracture of left shoulder girdle, part unspecified, subsequent encounter for fracture with routine healing: Secondary | ICD-10-CM

## 2015-09-06 NOTE — Progress Notes (Signed)
Follow-up visit fracture care 82 year old female fell several months ago she is in outpatient physical therapy after in house therapy still very stiff but minimal pain date of injury January 14  Recommend continue physical therapy and follow-up in 2 months

## 2015-09-09 DIAGNOSIS — B351 Tinea unguium: Secondary | ICD-10-CM | POA: Diagnosis not present

## 2015-09-09 DIAGNOSIS — M79671 Pain in right foot: Secondary | ICD-10-CM | POA: Diagnosis not present

## 2015-09-09 DIAGNOSIS — I739 Peripheral vascular disease, unspecified: Secondary | ICD-10-CM | POA: Diagnosis not present

## 2015-09-09 DIAGNOSIS — M79672 Pain in left foot: Secondary | ICD-10-CM | POA: Diagnosis not present

## 2015-09-10 ENCOUNTER — Ambulatory Visit (HOSPITAL_COMMUNITY): Payer: Medicare Other | Admitting: Occupational Therapy

## 2015-09-10 ENCOUNTER — Encounter (HOSPITAL_COMMUNITY): Payer: Self-pay | Admitting: Occupational Therapy

## 2015-09-10 DIAGNOSIS — M6281 Muscle weakness (generalized): Secondary | ICD-10-CM | POA: Diagnosis not present

## 2015-09-10 DIAGNOSIS — R29898 Other symptoms and signs involving the musculoskeletal system: Secondary | ICD-10-CM | POA: Diagnosis not present

## 2015-09-10 DIAGNOSIS — M25512 Pain in left shoulder: Secondary | ICD-10-CM | POA: Diagnosis not present

## 2015-09-10 DIAGNOSIS — M25612 Stiffness of left shoulder, not elsewhere classified: Secondary | ICD-10-CM | POA: Diagnosis not present

## 2015-09-10 NOTE — Therapy (Signed)
[facility] [place], [place], [address] Phone: [phone]   Fax:  [phone]  Occupational Therapy Treatment  Patient Details  Name: Kristy Jarvis MRN: [id number] Date of Birth: [birth year] Referring Provider: Dr. Arther Abbott  Encounter Date: 09/10/2015      OT End of Session - 09/10/15 2329    Visit Number 2   Number of Visits 16   Date for OT Re-Evaluation 11/02/15  Mini reassess 10/01/15   Authorization Type U[employer] Medicare   Authorization Time Period Before 10th visit   Authorization - Visit Number 2   Authorization - Number of Visits 10   OT Start Time [id number]   OT Stop Time 1428   OT Time Calculation (min) 40 min   Activity Tolerance Patient tolerated treatment well   Behavior During Therapy [facility] for tasks assessed/performed      Past Medical History  Diagnosis Date  . Intermittent vertigo   . Renal colic   . Osteoporosis   . Hyperlipidemia   . GERD (gastroesophageal reflux disease)   . Diverticulosis of colon     by CT  . PONV (postoperative nausea and vomiting)   . Cataract 2013    right, no surgery at this time    Past Surgical History  Procedure Laterality Date  . Cataract extraction  2002    left eye   . Cholecystectomy  1989  . Abdominal hysterectomy    . Esophagogastroduodenoscopy  Jan  2007    prominent schatzki's ring, s/p 73F, small to moderate size hh  . Bunionectomy  06/27/2011    left , Dr Berline Lopes  . Eye surgery  2005 approx    cataract left  . Intramedullary (im) nail intertrochanteric Left 01/02/2014    Procedure: INTRAMEDULLARY (IM) NAIL INTERTROCHANTRIC;  Surgeon: Newt Minion, MD;  Location: [facility];  Service: Orthopedics;  Laterality: Left;  . Hip surgery Left 12/20/2013    There were no vitals filed for this visit.      Subjective Assessment - 09/10/15 1350    Subjective  S: It doesn't hurt until I touch it.    Currently in Pain? No/denies            [facility] OT  Assessment - 09/10/15 2328    Assessment   Diagnosis Right shoulder fx   Precautions   Precautions None                  OT Treatments/Exercises (OP) - 09/10/15 1409    Exercises   Exercises Shoulder   Shoulder Exercises: Supine   Protraction PROM;10 reps;AAROM;5 reps   Horizontal ABduction PROM;10 reps;AAROM;5 reps   External Rotation PROM;10 reps;AAROM;5 reps   Internal Rotation PROM;10 reps;AAROM;5 reps   Flexion PROM;10 reps;AAROM;5 reps   ABduction PROM;10 reps;AAROM;5 reps   Shoulder Exercises: Seated   Elevation AROM;10 reps   Extension AROM;10 reps   Row AROM;10 reps   Shoulder Exercises: Therapy Ball   Flexion 10 reps   ABduction 10 reps   Shoulder Exercises: ROM/Strengthening   Thumb Tacks 1'   Manual Therapy   Manual Therapy Myofascial release   Manual therapy comments Manual therapy completed prior to therapeutic exercise this date   Myofascial Release Myofascial release to left upper arm, trapezius, and scapularis regions to decrease pain and fascial restrictions and increase joint range of motion                OT Education - 09/10/15 2329  Education provided Yes   Education Details scapular A/ROM exercises   Person(s) Educated Patient   Methods Explanation;Demonstration;Handout   Comprehension Verbalized understanding;Returned demonstration          OT Short Term Goals - 09/10/15 2334    OT SHORT TERM GOAL #1   Title Pt will be educated on HEP.    Time 4   Period Weeks   Status On-going   OT SHORT TERM GOAL #2   Title Pt will decrease pain in LUE to 5/10 or less to increase ability to complete daily tasks using LUE as assist.    Time 4   Period Weeks   Status On-going   OT SHORT TERM GOAL #3   Title Pt will decrease fascial restictions in the LUE to mod amounts or less to increase mobility during manual stretching.    Time 4   Period Weeks   Status On-going   OT SHORT TERM GOAL #4   Title Pt will increase P/ROM to [facility] to  increase ability to use LUE as assist when donning shirts.    Time 4   Period Weeks   Status On-going   OT SHORT TERM GOAL #5   Title Pt will increase strength in LUE to 3+/5 to increase ability to use LUE as assist when preparing meals.    Time 4   Period Weeks   Status On-going           OT Long Term Goals - 09/10/15 2334    OT LONG TERM GOAL #1   Title Pt will return to prior level of functioning and independence in daily tasks.    Time 8   Period Weeks   Status On-going   OT LONG TERM GOAL #2   Title Pt will decrease pain in LUE to 2/10 or less to increase ability to complete functional tasks with LUE.    Time 8   Period Weeks   Status On-going   OT LONG TERM GOAL #3   Title Pt will decrease fascial restrictions in LUE to min amounts or less to increase mobility in the LUE during daily tasks.    Time 8   Period Weeks   Status On-going   OT LONG TERM GOAL #4   Title Pt will increase A/ROM to [facility] to increase ability to wash and roll her hair.    Time 8   Period Weeks   Status On-going   OT LONG TERM GOAL #5   Title Pt will increase strength in LUE to 4/5 to increase ability to push her wheelbarrow when picking up sticks in the yard.    Time 8   Period Weeks   Status On-going               Plan - 09/10/15 2330    Clinical Impression Statement A: Initiated myofascial release, manual stretching, P/ROM, scapular A/ROM exercises, and pulleys. Pt with mod difficulty relaxing arm to allow OT to perform passive stretching, consistent verbal cuing required. Pt with increased pain and tenderness during manual therapy. Provided pt with scapular A/ROM exercises this session.    Rehab Potential Good   OT Frequency 2x / week   OT Duration 8 weeks   OT Treatment/Interventions Self-care/ADL training;Passive range of motion;Patient/family education;Cryotherapy;Moist Heat;Therapeutic exercise;Manual Therapy;Therapeutic activities   Plan P: Continue working to increase P/ROM  to [facility], increase AA/ROM to 10 repetitions in supine, follow up on scapular A/ROM exercises   OT Home Exercise Plan scapular A/ROM exercises  Consulted and Agree with Plan of Care Patient      Patient will benefit from skilled therapeutic intervention in order to improve the following deficits and impairments:  Decreased strength, Pain, Impaired UE functional use, Increased fascial restricitons, Decreased range of motion, Impaired flexibility  Visit Diagnosis: Pain in left shoulder  Stiffness of left shoulder, not elsewhere classified  Other symptoms and signs involving the musculoskeletal system  Muscle weakness (generalized)    Problem List Patient Active Problem List   Diagnosis Date Noted  . At high risk for falls 07/05/2015  . Shoulder fracture, left 06/07/2015  . Closed fracture of orbital floor ([place]) 06/07/2015  . CKD (chronic kidney disease) stage 3, GFR 30-59 ml/min 05/24/2015  . Right kidney stone 02/22/2015  . Intertrochanteric fracture of left hip ([place]) 01/25/2014  . Hyperglycemia 03/25/2013  . MCI (mild cognitive impairment) 10/23/2012  . Esophageal dysphagia 08/31/2011  . Hyperlipemia 08/13/2007  . GERD 08/13/2007  . Osteoporosis 08/13/2007  . INTERMITTENT VERTIGO 08/13/2007    Guadelupe Sabin, OTR/L  [phone]  09/10/2015, 11:35 PM  [facility] [address] [place], [place], [address] Phone: [phone]   Fax:  [phone]  Name: Kristy Jarvis MRN: [id number] Date of Birth: 07/19/1920

## 2015-09-10 NOTE — Patient Instructions (Signed)
1) Seated Row   Sit up straight with elbows by your sides. Pull back with shoulders/elbows, keeping forearms straight, as if pulling back on the reins of a horse. Squeeze shoulder blades together. Repeat _10-15__times, _1-2___sets/day    2) Shoulder Elevation    Sit up straight with arms by your sides. Slowly bring your shoulders up towards your ears. Repeat_10-15__times, __1-2__ sets/day    3) Shoulder Extension    Sit up straight with both arms by your side, draw your arms back behind your waist. Keep your elbows straight. Repeat __10-15__times, _1-2___sets/day.

## 2015-09-15 ENCOUNTER — Encounter (HOSPITAL_COMMUNITY): Payer: Self-pay

## 2015-09-15 ENCOUNTER — Ambulatory Visit (HOSPITAL_COMMUNITY): Payer: Medicare Other

## 2015-09-15 DIAGNOSIS — M6281 Muscle weakness (generalized): Secondary | ICD-10-CM | POA: Diagnosis not present

## 2015-09-15 DIAGNOSIS — R29898 Other symptoms and signs involving the musculoskeletal system: Secondary | ICD-10-CM | POA: Diagnosis not present

## 2015-09-15 DIAGNOSIS — M25512 Pain in left shoulder: Secondary | ICD-10-CM | POA: Diagnosis not present

## 2015-09-15 DIAGNOSIS — M25612 Stiffness of left shoulder, not elsewhere classified: Secondary | ICD-10-CM | POA: Diagnosis not present

## 2015-09-15 NOTE — Therapy (Addendum)
[place], [place], [address] Phone: [phone]   Fax:  [phone]  Occupational Therapy Treatment  Patient Details  Name: Kristy Jarvis MRN: [id number] Date of Birth: September 13, 1920 Referring Provider: Dr. Arther Abbott  Encounter Date: 09/15/2015      OT End of Session - 09/15/15 1630    Visit Number 3   Number of Visits 16   Date for OT Re-Evaluation 11/02/15  Mini reassess 10/01/15   Authorization Type [employer] Medicare   Authorization Time Period Before 10th visit   Authorization - Visit Number 3   Authorization - Number of Visits 10   OT Start Time 1350   OT Stop Time 1430   OT Time Calculation (min) 40 min   Activity Tolerance Patient tolerated treatment well   Behavior During Therapy [facility] for tasks assessed/performed      Past Medical History  Diagnosis Date  . Intermittent vertigo   . Renal colic   . Osteoporosis   . Hyperlipidemia   . GERD (gastroesophageal reflux disease)   . Diverticulosis of colon     by CT  . PONV (postoperative nausea and vomiting)   . Cataract 2013    right, no surgery at this time    Past Surgical History  Procedure Laterality Date  . Cataract extraction  2002    left eye   . Cholecystectomy  1989  . Abdominal hysterectomy    . Esophagogastroduodenoscopy  Jan  2007    prominent schatzki's ring, s/p 48F, small to moderate size hh  . Bunionectomy  06/27/2011    left , Dr Berline Lopes  . Eye surgery  2005 approx    cataract left  . Intramedullary (im) nail intertrochanteric Left 01/02/2014    Procedure: INTRAMEDULLARY (IM) NAIL INTERTROCHANTRIC;  Surgeon: Newt Minion, MD;  Location: [place];  Service: Orthopedics;  Laterality: Left;  . Hip surgery Left 12/20/2013    There were no vitals filed for this visit.      Subjective Assessment - 09/15/15 1352    Subjective  S: It's fine until I touch it.   Currently in Pain? No/denies            [facility] OT Assessment  - 09/15/15 1404    Assessment   Diagnosis Left shoulder fx   Precautions   Precautions None                  OT Treatments/Exercises (OP) - 09/15/15 1403    Exercises   Exercises Shoulder   Shoulder Exercises: Supine   Protraction PROM;5 reps;AAROM;10 reps   Horizontal ABduction PROM;5 reps;AAROM;10 reps   External Rotation PROM;5 reps;AAROM;10 reps   Internal Rotation PROM;5 reps;AAROM;10 reps   Flexion PROM;5 reps;AROM;10 reps   ABduction PROM;5 reps;AAROM;10 reps   Shoulder Exercises: Seated   Elevation AROM;10 reps   Extension AROM;10 reps   Row AROM;10 reps   Protraction AAROM;10 reps   Horizontal ABduction AAROM;10 reps   External Rotation AAROM;10 reps   Internal Rotation AAROM;10 reps   Flexion AAROM;10 reps   Abduction AAROM;10 reps   Shoulder Exercises: Pulleys   Flexion 1 minute   ABduction 1 minute   Shoulder Exercises: Therapy Ball   Flexion 10 reps   ABduction 10 reps   Shoulder Exercises: ROM/Strengthening   Wall Wash 1'   Thumb Tacks 1'   Manual Therapy   Manual Therapy Myofascial release   Manual therapy comments Manual therapy completed prior to  therapeutic exercise this date   Myofascial Release Myofascial release to left upper arm, trapezius, and scapularis regions to decrease pain and fascial restrictions and increase joint range of motion                  OT Short Term Goals - 09/10/15 2334    OT SHORT TERM GOAL #1   Title Pt will be educated on HEP.    Time 4   Period Weeks   Status On-going   OT SHORT TERM GOAL #2   Title Pt will decrease pain in LUE to 5/10 or less to increase ability to complete daily tasks using LUE as assist.    Time 4   Period Weeks   Status On-going   OT SHORT TERM GOAL #3   Title Pt will decrease fascial restictions in the LUE to mod amounts or less to increase mobility during manual stretching.    Time 4   Period Weeks   Status On-going   OT SHORT TERM GOAL #4   Title Pt will increase  P/ROM to [facility] to increase ability to use LUE as assist when donning shirts.    Time 4   Period Weeks   Status On-going   OT SHORT TERM GOAL #5   Title Pt will increase strength in LUE to 3+/5 to increase ability to use LUE as assist when preparing meals.    Time 4   Period Weeks   Status On-going           OT Long Term Goals - 09/10/15 2334    OT LONG TERM GOAL #1   Title Pt will return to prior level of functioning and independence in daily tasks.    Time 8   Period Weeks   Status On-going   OT LONG TERM GOAL #2   Title Pt will decrease pain in LUE to 2/10 or less to increase ability to complete functional tasks with LUE.    Time 8   Period Weeks   Status On-going   OT LONG TERM GOAL #3   Title Pt will decrease fascial restrictions in LUE to min amounts or less to increase mobility in the LUE during daily tasks.    Time 8   Period Weeks   Status On-going   OT LONG TERM GOAL #4   Title Pt will increase A/ROM to [facility] to increase ability to wash and roll her hair.    Time 8   Period Weeks   Status On-going   OT LONG TERM GOAL #5   Title Pt will increase strength in LUE to 4/5 to increase ability to push her wheelbarrow when picking up sticks in the yard.    Time 8   Period Weeks   Status On-going               Plan - 09/15/15 1631    Clinical Impression Statement A: Patient reports increased pain upon touch, therapist notes min fascial restrictions. Added wall wash this session.   Plan P: Continue working to increase P/ROM to [facility], continue with AA/ROM in supine and sitting. Continue with muscle energy technique to increase ROM.      Patient will benefit from skilled therapeutic intervention in order to improve the following deficits and impairments:  Decreased strength, Pain, Impaired UE functional use, Increased fascial restricitons, Decreased range of motion, Impaired flexibility  Visit Diagnosis: Pain in left shoulder  Stiffness of left shoulder, not  elsewhere classified  Other symptoms  and signs involving the musculoskeletal system    Problem List Patient Active Problem List   Diagnosis Date Noted  . At high risk for falls 07/05/2015  . Shoulder fracture, left 06/07/2015  . Closed fracture of orbital floor ([place]) 06/07/2015  . CKD (chronic kidney disease) stage 3, GFR 30-59 ml/min 05/24/2015  . Right kidney stone 02/22/2015  . Intertrochanteric fracture of left hip ([place]) 01/25/2014  . Hyperglycemia 03/25/2013  . MCI (mild cognitive impairment) 10/23/2012  . Esophageal dysphagia 08/31/2011  . Hyperlipemia 08/13/2007  . GERD 08/13/2007  . Osteoporosis 08/13/2007  . INTERMITTENT VERTIGO 08/13/2007    Marijo Conception OTA student 09/15/2015, 4:34 PM  [place] [address] [place], [place], [address] Phone: [phone]   Fax:  [phone]  Name: RAYNIYA BRINGAS MRN: [id number] Date of Birth: 1920/08/02  This entire session was guided, instructed, and directly supervised by Ailene Ravel, OTR/L, CBIS.  Ailene Ravel, OTR/L,CBIS  [phone]   Addendum: Ailene Ravel, OTR/L,CBIS  [phone]

## 2015-09-17 ENCOUNTER — Ambulatory Visit (HOSPITAL_COMMUNITY): Payer: Medicare Other | Admitting: Specialist

## 2015-09-17 DIAGNOSIS — M25612 Stiffness of left shoulder, not elsewhere classified: Secondary | ICD-10-CM

## 2015-09-17 DIAGNOSIS — R29898 Other symptoms and signs involving the musculoskeletal system: Secondary | ICD-10-CM

## 2015-09-17 DIAGNOSIS — M6281 Muscle weakness (generalized): Secondary | ICD-10-CM | POA: Diagnosis not present

## 2015-09-17 DIAGNOSIS — M25512 Pain in left shoulder: Secondary | ICD-10-CM | POA: Diagnosis not present

## 2015-09-17 NOTE — Therapy (Signed)
[place], [place], [address] Phone: [phone]   Fax:  [phone]  Occupational Therapy Treatment  Patient Details  Name: Kristy Jarvis MRN: [id number] Date of Birth: 04/19/1921 Referring Provider: Dr. Arther Abbott  Encounter Date: 09/17/2015      OT End of Session - 09/17/15 1204    Visit Number 4   Number of Visits 16   Date for OT Re-Evaluation 11/02/15  mini reassess on 10/01/15   Authorization Type [employer] Medicare   Authorization Time Period Before 10th visit   Authorization - Visit Number 4   Authorization - Number of Visits 10   OT Start Time 1120   OT Stop Time 1159   OT Time Calculation (min) 39 min   Activity Tolerance Patient tolerated treatment well   Behavior During Therapy [facility] for tasks assessed/performed      Past Medical History  Diagnosis Date  . Intermittent vertigo   . Renal colic   . Osteoporosis   . Hyperlipidemia   . GERD (gastroesophageal reflux disease)   . Diverticulosis of colon     by CT  . PONV (postoperative nausea and vomiting)   . Cataract 2013    right, no surgery at this time    Past Surgical History  Procedure Laterality Date  . Cataract extraction  2002    left eye   . Cholecystectomy  1989  . Abdominal hysterectomy    . Esophagogastroduodenoscopy  Jan  2007    prominent schatzki's ring, s/p 67F, small to moderate size hh  . Bunionectomy  06/27/2011    left , Dr Berline Lopes  . Eye surgery  2005 approx    cataract left  . Intramedullary (im) nail intertrochanteric Left 01/02/2014    Procedure: INTRAMEDULLARY (IM) NAIL INTERTROCHANTRIC;  Surgeon: Newt Minion, MD;  Location: [place];  Service: Orthopedics;  Laterality: Left;  . Hip surgery Left 12/20/2013    There were no vitals filed for this visit.          [facility] OT Assessment - 09/17/15 0001    Assessment   Diagnosis Left Shoulder Fracture   Precautions   Precautions None                   OT Treatments/Exercises (OP) - 09/17/15 0001    Exercises   Exercises Shoulder   Shoulder Exercises: Supine   Protraction PROM;5 reps;AAROM;12 reps   Horizontal ABduction PROM;5 reps;AAROM;12 reps   External Rotation PROM;5 reps;AAROM;12 reps   Internal Rotation PROM;5 reps;AAROM;12 reps   Flexion PROM;5 reps;AAROM;12 reps   ABduction PROM;5 reps;AAROM;12 reps   Shoulder Exercises: Pulleys   Flexion 2 minutes   ABduction 2 minutes   Shoulder Exercises: Therapy Ball   Flexion 15 reps   ABduction 15 reps   Shoulder Exercises: ROM/Strengthening   Wall Wash 1'   Thumb Tacks 1'   Manual Therapy   Manual Therapy Myofascial release   Manual therapy comments Manual therapy completed prior to therapeutic exercise this date   Myofascial Release Myofascial release to left upper arm, trapezius, and scapularis regions to decrease pain and fascial restrictions and increase joint range of motion                  OT Short Term Goals - 09/10/15 2334    OT SHORT TERM GOAL #1   Title Pt will be educated on HEP.    Time 4   Period Weeks  Status On-going   OT SHORT TERM GOAL #2   Title Pt will decrease pain in LUE to 5/10 or less to increase ability to complete daily tasks using LUE as assist.    Time 4   Period Weeks   Status On-going   OT SHORT TERM GOAL #3   Title Pt will decrease fascial restictions in the LUE to mod amounts or less to increase mobility during manual stretching.    Time 4   Period Weeks   Status On-going   OT SHORT TERM GOAL #4   Title Pt will increase P/ROM to [facility] to increase ability to use LUE as assist when donning shirts.    Time 4   Period Weeks   Status On-going   OT SHORT TERM GOAL #5   Title Pt will increase strength in LUE to 3+/5 to increase ability to use LUE as assist when preparing meals.    Time 4   Period Weeks   Status On-going           OT Long Term Goals - 09/10/15 2334    OT LONG TERM GOAL #1   Title  Pt will return to prior level of functioning and independence in daily tasks.    Time 8   Period Weeks   Status On-going   OT LONG TERM GOAL #2   Title Pt will decrease pain in LUE to 2/10 or less to increase ability to complete functional tasks with LUE.    Time 8   Period Weeks   Status On-going   OT LONG TERM GOAL #3   Title Pt will decrease fascial restrictions in LUE to min amounts or less to increase mobility in the LUE during daily tasks.    Time 8   Period Weeks   Status On-going   OT LONG TERM GOAL #4   Title Pt will increase A/ROM to [facility] to increase ability to wash and roll her hair.    Time 8   Period Weeks   Status On-going   OT LONG TERM GOAL #5   Title Pt will increase strength in LUE to 4/5 to increase ability to push her wheelbarrow when picking up sticks in the yard.    Time 8   Period Weeks   Status On-going               Plan - 09/17/15 1205    Clinical Impression Statement A:  Patient is very tender to touch,with minimal fascial restrictions in left shoulder region.  P/ROM limiited to 60% range.    Plan P:  Improve range with P/ROM and AA/ROM as well as improve technique with all therapeutic exercises.      Patient will benefit from skilled therapeutic intervention in order to improve the following deficits and impairments:  Decreased strength, Pain, Impaired UE functional use, Increased fascial restricitons, Decreased range of motion, Impaired flexibility  Visit Diagnosis: Pain in left shoulder  Stiffness of left shoulder, not elsewhere classified  Other symptoms and signs involving the musculoskeletal system    Problem List Patient Active Problem List   Diagnosis Date Noted  . At high risk for falls 07/05/2015  . Shoulder fracture, left 06/07/2015  . Closed fracture of orbital floor ([place]) 06/07/2015  . CKD (chronic kidney disease) stage 3, GFR 30-59 ml/min 05/24/2015  . Right kidney stone 02/22/2015  . Intertrochanteric fracture of left  hip ([place]) 01/25/2014  . Hyperglycemia 03/25/2013  . MCI (mild cognitive impairment) 10/23/2012  .  Esophageal dysphagia 08/31/2011  . Hyperlipemia 08/13/2007  . GERD 08/13/2007  . Osteoporosis 08/13/2007  . INTERMITTENT VERTIGO 08/13/2007    Vangie Bicker, OTR/L [phone]  09/17/2015, 12:07 PM  [place] [address] [place], [place], [address] Phone: [phone]   Fax:  [phone]  Name: Kristy Jarvis MRN: [id number] Date of Birth: 24-Nov-1920

## 2015-09-17 NOTE — Addendum Note (Signed)
Addended by: Guadelupe Sabin A on: 09/17/2015 12:26 PM   Modules accepted: Orders

## 2015-09-21 ENCOUNTER — Encounter (HOSPITAL_COMMUNITY): Payer: Self-pay | Admitting: Occupational Therapy

## 2015-09-21 ENCOUNTER — Ambulatory Visit (HOSPITAL_COMMUNITY): Payer: Medicare Other | Attending: Orthopedic Surgery | Admitting: Occupational Therapy

## 2015-09-21 DIAGNOSIS — M6281 Muscle weakness (generalized): Secondary | ICD-10-CM | POA: Diagnosis not present

## 2015-09-21 DIAGNOSIS — M25612 Stiffness of left shoulder, not elsewhere classified: Secondary | ICD-10-CM | POA: Insufficient documentation

## 2015-09-21 DIAGNOSIS — M25512 Pain in left shoulder: Secondary | ICD-10-CM | POA: Insufficient documentation

## 2015-09-21 DIAGNOSIS — R29898 Other symptoms and signs involving the musculoskeletal system: Secondary | ICD-10-CM

## 2015-09-21 NOTE — Therapy (Signed)
[place], [place], [address] Phone: [phone]   Fax:  [phone]  Occupational Therapy Treatment  Patient Details  Name: Kristy Jarvis MRN: [id number] Date of Birth: [birth year] Referring Provider: Dr. Arther Abbott  Encounter Date: 09/21/2015      OT End of Session - 09/21/15 1559    Visit Number 5   Number of Visits 16   Date for OT Re-Evaluation 11/02/15  mini reassess on 10/01/15   Authorization Type [employer] Medicare   Authorization Time Period Before 10th visit   Authorization - Visit Number 5   Authorization - Number of Visits 10   OT Start Time 1512   OT Stop Time 1556   OT Time Calculation (min) 44 min   Activity Tolerance Patient tolerated treatment well   Behavior During Therapy [facility] for tasks assessed/performed      Past Medical History  Diagnosis Date  . Intermittent vertigo   . Renal colic   . Osteoporosis   . Hyperlipidemia   . GERD (gastroesophageal reflux disease)   . Diverticulosis of colon     by CT  . PONV (postoperative nausea and vomiting)   . Cataract 2013    right, no surgery at this time    Past Surgical History  Procedure Laterality Date  . Cataract extraction  2002    left eye   . Cholecystectomy  1989  . Abdominal hysterectomy    . Esophagogastroduodenoscopy  Jan  2007    prominent schatzki's ring, s/p 19F, small to moderate size hh  . Bunionectomy  06/27/2011    left , Dr Berline Lopes  . Eye surgery  2005 approx    cataract left  . Intramedullary (im) nail intertrochanteric Left 01/02/2014    Procedure: INTRAMEDULLARY (IM) NAIL INTERTROCHANTRIC;  Surgeon: Newt Minion, MD;  Location: [place];  Service: Orthopedics;  Laterality: Left;  . Hip surgery Left 12/20/2013    There were no vitals filed for this visit.      Subjective Assessment - 09/21/15 1515    Subjective  S: My exercises are going pretty good at home.    Currently in Pain? No/denies             [facility] OT Assessment - 09/21/15 1514    Assessment   Diagnosis Left Shoulder Fracture   Precautions   Precautions None                  OT Treatments/Exercises (OP) - 09/21/15 1515    Exercises   Exercises Shoulder   Shoulder Exercises: Supine   Protraction PROM;5 reps;AAROM;12 reps   Horizontal ABduction PROM;5 reps;AAROM;12 reps   External Rotation PROM;5 reps;AAROM;12 reps   Internal Rotation PROM;5 reps;AAROM;12 reps   Flexion PROM;5 reps;AAROM;12 reps   ABduction PROM;5 reps;AAROM;12 reps   Shoulder Exercises: Seated   Elevation AROM;10 reps   Extension AROM;10 reps   Row AROM;10 reps   Protraction AAROM;10 reps   Horizontal ABduction AAROM;10 reps   External Rotation AAROM;10 reps   Internal Rotation AAROM;10 reps   Flexion AAROM;10 reps   Abduction AAROM;10 reps   Shoulder Exercises: Pulleys   Flexion 2 minutes   ABduction 2 minutes   Shoulder Exercises: ROM/Strengthening   Wall Wash 1'   Thumb Tacks 1'   Prot/Ret//Elev/Dep 1'   Manual Therapy   Manual Therapy Myofascial release   Manual therapy comments Manual therapy completed prior to therapeutic exercise this date   Myofascial Release  Myofascial release to left upper arm, trapezius, and scapularis regions to decrease pain and fascial restrictions and increase joint range of motion                OT Education - 09/21/15 1531    Education provided Yes   Education Details AA/ROM exercises   Person(s) Educated Patient   Methods Explanation;Demonstration;Handout   Comprehension Verbalized understanding;Returned demonstration          OT Short Term Goals - 09/10/15 2334    OT SHORT TERM GOAL #1   Title Pt will be educated on HEP.    Time 4   Period Weeks   Status On-going   OT SHORT TERM GOAL #2   Title Pt will decrease pain in LUE to 5/10 or less to increase ability to complete daily tasks using LUE as assist.    Time 4   Period Weeks   Status On-going   OT SHORT TERM GOAL #3    Title Pt will decrease fascial restictions in the LUE to mod amounts or less to increase mobility during manual stretching.    Time 4   Period Weeks   Status On-going   OT SHORT TERM GOAL #4   Title Pt will increase P/ROM to [facility] to increase ability to use LUE as assist when donning shirts.    Time 4   Period Weeks   Status On-going   OT SHORT TERM GOAL #5   Title Pt will increase strength in LUE to 3+/5 to increase ability to use LUE as assist when preparing meals.    Time 4   Period Weeks   Status On-going           OT Long Term Goals - 09/10/15 2334    OT LONG TERM GOAL #1   Title Pt will return to prior level of functioning and independence in daily tasks.    Time 8   Period Weeks   Status On-going   OT LONG TERM GOAL #2   Title Pt will decrease pain in LUE to 2/10 or less to increase ability to complete functional tasks with LUE.    Time 8   Period Weeks   Status On-going   OT LONG TERM GOAL #3   Title Pt will decrease fascial restrictions in LUE to min amounts or less to increase mobility in the LUE during daily tasks.    Time 8   Period Weeks   Status On-going   OT LONG TERM GOAL #4   Title Pt will increase A/ROM to [facility] to increase ability to wash and roll her hair.    Time 8   Period Weeks   Status On-going   OT LONG TERM GOAL #5   Title Pt will increase strength in LUE to 4/5 to increase ability to push her wheelbarrow when picking up sticks in the yard.    Time 8   Period Weeks   Status On-going               Plan - 09/21/15 1559    Clinical Impression Statement A: Added prot/ret/elev/dep this session, pt required tactile cuing for form to initiate exercise. Pt continues to be tender to touch, however tolerated slightly increased pressure this session. Pt provided with AA/ROM exercises for HEP.    Rehab Potential Good   OT Frequency 2x / week   OT Duration 8 weeks   OT Treatment/Interventions Self-care/ADL training;Passive range of  motion;Patient/family education;Cryotherapy;Moist Heat;Therapeutic exercise;Manual Therapy;Therapeutic activities  Plan P: Continue working to improve P/ROM and AA/ROM and increase independence with form during exercises. Follow up on AA/ROM exercises.    OT Home Exercise Plan AA/ROM exercises   Consulted and Agree with Plan of Care Patient      Patient will benefit from skilled therapeutic intervention in order to improve the following deficits and impairments:  Decreased strength, Pain, Impaired UE functional use, Increased fascial restricitons, Decreased range of motion, Impaired flexibility  Visit Diagnosis: Pain in left shoulder  Stiffness of left shoulder, not elsewhere classified  Other symptoms and signs involving the musculoskeletal system  Muscle weakness (generalized)    Problem List Patient Active Problem List   Diagnosis Date Noted  . At high risk for falls 07/05/2015  . Shoulder fracture, left 06/07/2015  . Closed fracture of orbital floor ([place]) 06/07/2015  . CKD (chronic kidney disease) stage 3, GFR 30-59 ml/min 05/24/2015  . Right kidney stone 02/22/2015  . Intertrochanteric fracture of left hip ([place]) 01/25/2014  . Hyperglycemia 03/25/2013  . MCI (mild cognitive impairment) 10/23/2012  . Esophageal dysphagia 08/31/2011  . Hyperlipemia 08/13/2007  . GERD 08/13/2007  . Osteoporosis 08/13/2007  . INTERMITTENT VERTIGO 08/13/2007    Guadelupe Sabin, OTR/L  [phone]  09/21/2015, 4:02 PM  [place] [address] [place], [place], [address] Phone: [phone]   Fax:  [phone]  Name: Kristy Jarvis MRN: [id number] Date of Birth: March 25, 1921

## 2015-09-21 NOTE — Patient Instructions (Signed)
Perform each exercise __10-15______times. 2-3x days.   Protraction - STANDING  Start by holding a wand or cane at chest height.  Next, slowly push the wand outwards in front of your body so that your elbows become fully straightened. Then, return to the original position.     Shoulder FLEXION - STANDING - PALMS UP  In the standing position, hold a wand/cane with both arms, palms up on both sides. Raise up the wand/cane allowing your unaffected arm to perform most of the effort. Your affected arm should be partially relaxed.      Internal/External ROTATION - STANDING  In the standing position, hold a wand/cane with both hands keeping your elbows bent. Move your arms and wand/cane to one side.  Your affected arm should be partially relaxed while your unaffected arm performs most of the effort.       Shoulder ABDUCTION - STANDING  While holding a wand/cane palm face up on the injured side and palm face down on the uninjured side, slowly raise up your injured arm to the side.                     Horizontal Abduction/Adduction      Straight arms holding cane at shoulder height, bring cane to right, center, left. Repeat starting to left.   Copyright  VHI. All rights reserved.

## 2015-09-22 ENCOUNTER — Encounter: Payer: Medicare Other | Admitting: Family Medicine

## 2015-09-23 ENCOUNTER — Ambulatory Visit (HOSPITAL_COMMUNITY): Payer: Medicare Other

## 2015-09-23 ENCOUNTER — Encounter (HOSPITAL_COMMUNITY): Payer: Self-pay

## 2015-09-23 VITALS — BP 139/78

## 2015-09-23 DIAGNOSIS — M25512 Pain in left shoulder: Secondary | ICD-10-CM | POA: Diagnosis not present

## 2015-09-23 DIAGNOSIS — M25612 Stiffness of left shoulder, not elsewhere classified: Secondary | ICD-10-CM | POA: Diagnosis not present

## 2015-09-23 DIAGNOSIS — R29898 Other symptoms and signs involving the musculoskeletal system: Secondary | ICD-10-CM

## 2015-09-23 DIAGNOSIS — M6281 Muscle weakness (generalized): Secondary | ICD-10-CM | POA: Diagnosis not present

## 2015-09-23 NOTE — Therapy (Signed)
[place], [place], [address] Phone: [phone]   Fax:  [phone]  Occupational Therapy Treatment  Patient Details  Name: Kristy Jarvis MRN: [id number] Date of Birth: [birth year] Referring Provider: Dr. Arther Abbott  Encounter Date: 09/23/2015      OT End of Session - 09/23/15 1644    Visit Number 6   Number of Visits 16   Date for OT Re-Evaluation 11/02/15  mini reassess on 10/01/15   Authorization Type [employer] Medicare   Authorization Time Period Before 10th visit   Authorization - Visit Number 6   Authorization - Number of Visits 10   OT Start Time 1605   OT Stop Time 1645   OT Time Calculation (min) 40 min   Activity Tolerance Patient tolerated treatment well   Behavior During Therapy [facility] for tasks assessed/performed      Past Medical History  Diagnosis Date  . Intermittent vertigo   . Renal colic   . Osteoporosis   . Hyperlipidemia   . GERD (gastroesophageal reflux disease)   . Diverticulosis of colon     by CT  . PONV (postoperative nausea and vomiting)   . Cataract 2013    right, no surgery at this time    Past Surgical History  Procedure Laterality Date  . Cataract extraction  2002    left eye   . Cholecystectomy  1989  . Abdominal hysterectomy    . Esophagogastroduodenoscopy  Jan  2007    prominent schatzki's ring, s/p 79F, small to moderate size hh  . Bunionectomy  06/27/2011    left , Dr Berline Lopes  . Eye surgery  2005 approx    cataract left  . Intramedullary (im) nail intertrochanteric Left 01/02/2014    Procedure: INTRAMEDULLARY (IM) NAIL INTERTROCHANTRIC;  Surgeon: Newt Minion, MD;  Location: [facility];  Service: Orthopedics;  Laterality: Left;  . Hip surgery Left 12/20/2013    Filed Vitals:   09/23/15 1630  BP: 139/78        Subjective Assessment - 09/23/15 1608    Subjective  S: No pain right now.   Currently in Pain? No/denies            [facility] OT Assessment -  09/23/15 1607    Assessment   Diagnosis Left Shoulder Fracture   Precautions   Precautions None                  OT Treatments/Exercises (OP) - 09/23/15 1605    Exercises   Exercises Shoulder   Shoulder Exercises: Supine   Protraction PROM;5 reps;AAROM;12 reps   Horizontal ABduction PROM;5 reps;AAROM;12 reps   External Rotation PROM;5 reps;AAROM;12 reps   Internal Rotation PROM;5 reps;AAROM;12 reps   Flexion PROM;5 reps;AAROM;12 reps   ABduction PROM;5 reps;AAROM;12 reps   Shoulder Exercises: Seated   Elevation AROM;10 reps   Extension AROM;10 reps   Row AROM;10 reps   Protraction AAROM;15 reps   Horizontal ABduction AAROM;10 reps   External Rotation AAROM;10 reps   Internal Rotation AAROM;10 reps   Flexion AAROM;10 reps   Abduction AAROM;10 reps   Shoulder Exercises: ROM/Strengthening   Wall Wash 1'   Thumb Tacks 1'   Manual Therapy   Manual Therapy Myofascial release   Manual therapy comments Manual therapy completed prior to therapeutic exercise this date   Myofascial Release Myofascial release to left upper arm, trapezius, and scapularis regions to decrease pain and fascial restrictions and increase joint range of  motion                  OT Short Term Goals - 09/10/15 2334    OT SHORT TERM GOAL #1   Title Pt will be educated on HEP.    Time 4   Period Weeks   Status On-going   OT SHORT TERM GOAL #2   Title Pt will decrease pain in LUE to 5/10 or less to increase ability to complete daily tasks using LUE as assist.    Time 4   Period Weeks   Status On-going   OT SHORT TERM GOAL #3   Title Pt will decrease fascial restictions in the LUE to mod amounts or less to increase mobility during manual stretching.    Time 4   Period Weeks   Status On-going   OT SHORT TERM GOAL #4   Title Pt will increase P/ROM to [facility] to increase ability to use LUE as assist when donning shirts.    Time 4   Period Weeks   Status On-going   OT SHORT TERM GOAL #5    Title Pt will increase strength in LUE to 3+/5 to increase ability to use LUE as assist when preparing meals.    Time 4   Period Weeks   Status On-going           OT Long Term Goals - 09/10/15 2334    OT LONG TERM GOAL #1   Title Pt will return to prior level of functioning and independence in daily tasks.    Time 8   Period Weeks   Status On-going   OT LONG TERM GOAL #2   Title Pt will decrease pain in LUE to 2/10 or less to increase ability to complete functional tasks with LUE.    Time 8   Period Weeks   Status On-going   OT LONG TERM GOAL #3   Title Pt will decrease fascial restrictions in LUE to min amounts or less to increase mobility in the LUE during daily tasks.    Time 8   Period Weeks   Status On-going   OT LONG TERM GOAL #4   Title Pt will increase A/ROM to [facility] to increase ability to wash and roll her hair.    Time 8   Period Weeks   Status On-going   OT LONG TERM GOAL #5   Title Pt will increase strength in LUE to 4/5 to increase ability to push her wheelbarrow when picking up sticks in the yard.    Time 8   Period Weeks   Status On-going               Plan - 09/23/15 1645    Clinical Impression Statement A: Patient reports increased dizziness with activities this session. Therapist took blood pressure and it was within normal range. Patient continues to be tender to touch during manual therapy.   Plan P: Continue working to improve P/ROM and AA/ROM. Resume pulleys and focus on increasing independence with form during exercises.      Patient will benefit from skilled therapeutic intervention in order to improve the following deficits and impairments:  Decreased strength, Pain, Impaired UE functional use, Increased fascial restricitons, Decreased range of motion, Impaired flexibility  Visit Diagnosis: Pain in left shoulder  Stiffness of left shoulder, not elsewhere classified  Other symptoms and signs involving the musculoskeletal  system    Problem List Patient Active Problem List   Diagnosis Date Noted  .  At high risk for falls 07/05/2015  . Shoulder fracture, left 06/07/2015  . Closed fracture of orbital floor ([place]) 06/07/2015  . CKD (chronic kidney disease) stage 3, GFR 30-59 ml/min 05/24/2015  . Right kidney stone 02/22/2015  . Intertrochanteric fracture of left hip ([place]) 01/25/2014  . Hyperglycemia 03/25/2013  . MCI (mild cognitive impairment) 10/23/2012  . Esophageal dysphagia 08/31/2011  . Hyperlipemia 08/13/2007  . GERD 08/13/2007  . Osteoporosis 08/13/2007  . INTERMITTENT VERTIGO 08/13/2007    Marijo Conception OTA student 09/23/2015, 4:50 PM  [place] [address] [place], [place], [address] Phone: [phone]   Fax:  [phone]  Name: PHILADELPHIA RANSFORD MRN: [id number] Date of Birth: [birth year]  This entire session was guided, instructed, and directly supervised by Ailene Ravel, OTR/L, CBIS.  Ailene Ravel, OTR/L,CBIS  [phone]

## 2015-09-28 ENCOUNTER — Ambulatory Visit (HOSPITAL_COMMUNITY): Payer: Medicare Other | Admitting: Occupational Therapy

## 2015-09-28 ENCOUNTER — Encounter (HOSPITAL_COMMUNITY): Payer: Self-pay | Admitting: Occupational Therapy

## 2015-09-28 DIAGNOSIS — M25512 Pain in left shoulder: Secondary | ICD-10-CM

## 2015-09-28 DIAGNOSIS — R29898 Other symptoms and signs involving the musculoskeletal system: Secondary | ICD-10-CM

## 2015-09-28 DIAGNOSIS — M25612 Stiffness of left shoulder, not elsewhere classified: Secondary | ICD-10-CM

## 2015-09-28 DIAGNOSIS — M6281 Muscle weakness (generalized): Secondary | ICD-10-CM | POA: Diagnosis not present

## 2015-09-28 NOTE — Therapy (Signed)
[place], [place], [address] Phone: [phone]   Fax:  [phone]  Occupational Therapy Treatment  Patient Details  Name: Kristy Jarvis MRN: [id number] Date of Birth: 03/15/1921 Referring Provider: Dr. Arther Abbott  Encounter Date: 09/28/2015      OT End of Session - 09/28/15 1609    Visit Number 7   Number of Visits 16   Date for OT Re-Evaluation 11/02/15  mini reassess on 10/01/15   Authorization Type [employer] Medicare   Authorization Time Period Before 10th visit   Authorization - Visit Number 7   Authorization - Number of Visits 10   OT Start Time 1429   OT Stop Time 1513   OT Time Calculation (min) 44 min   Activity Tolerance Patient tolerated treatment well   Behavior During Therapy [facility] for tasks assessed/performed      Past Medical History  Diagnosis Date  . Intermittent vertigo   . Renal colic   . Osteoporosis   . Hyperlipidemia   . GERD (gastroesophageal reflux disease)   . Diverticulosis of colon     by CT  . PONV (postoperative nausea and vomiting)   . Cataract 2013    right, no surgery at this time    Past Surgical History  Procedure Laterality Date  . Cataract extraction  2002    left eye   . Cholecystectomy  1989  . Abdominal hysterectomy    . Esophagogastroduodenoscopy  Jan  2007    prominent schatzki's ring, s/p 66F, small to moderate size hh  . Bunionectomy  06/27/2011    left , Dr Berline Lopes  . Eye surgery  2005 approx    cataract left  . Intramedullary (im) nail intertrochanteric Left 01/02/2014    Procedure: INTRAMEDULLARY (IM) NAIL INTERTROCHANTRIC;  Surgeon: Newt Minion, MD;  Location: [facility];  Service: Orthopedics;  Laterality: Left;  . Hip surgery Left 12/20/2013    There were no vitals filed for this visit.      Subjective Assessment - 09/28/15 1428    Subjective  S: My arm isn't hurting today.    Currently in Pain? No/denies            [facility] OT  Assessment - 09/28/15 1428    Assessment   Diagnosis Left Shoulder Fracture   Precautions   Precautions None                  OT Treatments/Exercises (OP) - 09/28/15 1431    Exercises   Exercises Shoulder   Shoulder Exercises: Supine   Protraction PROM;5 reps;AAROM;12 reps   Horizontal ABduction PROM;5 reps;AAROM;12 reps   External Rotation PROM;5 reps;AAROM;12 reps   Internal Rotation PROM;5 reps;AAROM;12 reps   Flexion PROM;5 reps;AAROM;12 reps   ABduction PROM;5 reps;AAROM;12 reps   Shoulder Exercises: Seated   Extension Theraband;10 reps   Theraband Level (Shoulder Extension) Level 2 (Red)   Row Theraband;10 reps   Theraband Level (Shoulder Row) Level 2 (Red)   Protraction AAROM;15 reps   Horizontal ABduction AAROM;12 reps   External Rotation AAROM;12 reps   Internal Rotation AAROM;12 reps   Flexion AAROM;12 reps   Abduction AAROM;12 reps   Shoulder Exercises: Pulleys   Flexion 2 minutes   ABduction 2 minutes   Shoulder Exercises: ROM/Strengthening   Wall Wash 2'   Proximal Shoulder Strengthening, Supine 10X each no rest breaks  verbal and visual cuing   Prot/Ret//Elev/Dep 1'   Manual Therapy   Manual  Therapy Myofascial release   Manual therapy comments Manual therapy completed prior to therapeutic exercise this date   Myofascial Release Myofascial release to left upper arm, trapezius, and scapularis regions to decrease pain and fascial restrictions and increase joint range of motion                  OT Short Term Goals - 09/10/15 2334    OT SHORT TERM GOAL #1   Title Pt will be educated on HEP.    Time 4   Period Weeks   Status On-going   OT SHORT TERM GOAL #2   Title Pt will decrease pain in LUE to 5/10 or less to increase ability to complete daily tasks using LUE as assist.    Time 4   Period Weeks   Status On-going   OT SHORT TERM GOAL #3   Title Pt will decrease fascial restictions in the LUE to mod amounts or less to increase  mobility during manual stretching.    Time 4   Period Weeks   Status On-going   OT SHORT TERM GOAL #4   Title Pt will increase P/ROM to [facility] to increase ability to use LUE as assist when donning shirts.    Time 4   Period Weeks   Status On-going   OT SHORT TERM GOAL #5   Title Pt will increase strength in LUE to 3+/5 to increase ability to use LUE as assist when preparing meals.    Time 4   Period Weeks   Status On-going           OT Long Term Goals - 09/10/15 2334    OT LONG TERM GOAL #1   Title Pt will return to prior level of functioning and independence in daily tasks.    Time 8   Period Weeks   Status On-going   OT LONG TERM GOAL #2   Title Pt will decrease pain in LUE to 2/10 or less to increase ability to complete functional tasks with LUE.    Time 8   Period Weeks   Status On-going   OT LONG TERM GOAL #3   Title Pt will decrease fascial restrictions in LUE to min amounts or less to increase mobility in the LUE during daily tasks.    Time 8   Period Weeks   Status On-going   OT LONG TERM GOAL #4   Title Pt will increase A/ROM to [facility] to increase ability to wash and roll her hair.    Time 8   Period Weeks   Status On-going   OT LONG TERM GOAL #5   Title Pt will increase strength in LUE to 4/5 to increase ability to push her wheelbarrow when picking up sticks in the yard.    Time 8   Period Weeks   Status On-going               Plan - 09/28/15 1609    Clinical Impression Statement A: Completed all AA/ROM exercises with 12 repetitions this session, added theraband extension and row, resumed pulley exercises. Pt continues to required verbal and occasional tactile cuing for correct form and technique during exercises. Pt with reports of minimal increase in soreness at end of session.    Rehab Potential Good   OT Frequency 2x / week   OT Duration 8 weeks   OT Treatment/Interventions Self-care/ADL training;Passive range of motion;Patient/family  education;Cryotherapy;Moist Heat;Therapeutic exercise;Manual Therapy;Therapeutic activities   Plan P: Mini-reassessment. Continue working  towards improving P/ROM and AA/ROM, add muscle energy technique. Continue theraband, add retraction when appropriate.    Consulted and Agree with Plan of Care Patient      Patient will benefit from skilled therapeutic intervention in order to improve the following deficits and impairments:  Decreased strength, Pain, Impaired UE functional use, Increased fascial restricitons, Decreased range of motion, Impaired flexibility  Visit Diagnosis: Pain in left shoulder  Stiffness of left shoulder, not elsewhere classified  Other symptoms and signs involving the musculoskeletal system    Problem List Patient Active Problem List   Diagnosis Date Noted  . At high risk for falls 07/05/2015  . Shoulder fracture, left 06/07/2015  . Closed fracture of orbital floor ([place]) 06/07/2015  . CKD (chronic kidney disease) stage 3, GFR 30-59 ml/min 05/24/2015  . Right kidney stone 02/22/2015  . Intertrochanteric fracture of left hip ([place]) 01/25/2014  . Hyperglycemia 03/25/2013  . MCI (mild cognitive impairment) 10/23/2012  . Esophageal dysphagia 08/31/2011  . Hyperlipemia 08/13/2007  . GERD 08/13/2007  . Osteoporosis 08/13/2007  . INTERMITTENT VERTIGO 08/13/2007    Guadelupe Sabin, OTR/L  [phone]  09/28/2015, 4:12 PM  [facility] [address] [place], [place], [address] Phone: [phone]   Fax:  [phone]  Name: KEARSTEN GINTHER MRN: [id number] Date of Birth: 1921/03/16

## 2015-09-30 ENCOUNTER — Encounter (HOSPITAL_COMMUNITY): Payer: Self-pay

## 2015-09-30 ENCOUNTER — Other Ambulatory Visit: Payer: Self-pay | Admitting: Family Medicine

## 2015-09-30 ENCOUNTER — Ambulatory Visit (HOSPITAL_COMMUNITY): Payer: Medicare Other

## 2015-09-30 DIAGNOSIS — R29898 Other symptoms and signs involving the musculoskeletal system: Secondary | ICD-10-CM

## 2015-09-30 DIAGNOSIS — M25612 Stiffness of left shoulder, not elsewhere classified: Secondary | ICD-10-CM

## 2015-09-30 DIAGNOSIS — M6281 Muscle weakness (generalized): Secondary | ICD-10-CM | POA: Diagnosis not present

## 2015-09-30 DIAGNOSIS — M25512 Pain in left shoulder: Secondary | ICD-10-CM | POA: Diagnosis not present

## 2015-09-30 NOTE — Therapy (Signed)
[facility] [address] [place], [place], [address] Phone: [phone]   Fax:  [phone]  Occupational Therapy Treatment And mini reassessment  Patient Details  Name: Kristy Jarvis MRN: [id number] Date of Birth: 11/19/1920 Referring Provider: Dr. Stanley Harrison  Encounter Date: 09/30/2015      OT End of Session - 09/30/15 1527    Visit Number 8   Number of Visits 16   Date for OT Re-Evaluation 11/02/15   Authorization Type [employer] Medicare   Authorization Time Period Before 18th visit   Authorization - Visit Number 8   Authorization - Number of Visits 18   OT Start Time 1430  mini reassessment   OT Stop Time 1515   OT Time Calculation (min) 45 min   Activity Tolerance Patient tolerated treatment well   Behavior During Therapy W[facility] for tasks assessed/performed      Past Medical History  Diagnosis Date  . Intermittent vertigo   . Renal colic   . Osteoporosis   . Hyperlipidemia   . GERD (gastroesophageal reflux disease)   . Diverticulosis of colon     by CT  . PONV (postoperative nausea and vomiting)   . Cataract 2013    right, no surgery at this time    Past Surgical History  Procedure Laterality Date  . Cataract extraction  2002    left eye   . Cholecystectomy  1989  . Abdominal hysterectomy    . Esophagogastroduodenoscopy  Jan  2007    prominent schatzki's ring, s/p 56F, small to moderate size hh  . Bunionectomy  06/27/2011    left , Dr Tucker  . Eye surgery  2005 approx    cataract left  . Intramedullary (im) nail intertrochanteric Left 01/02/2014    Procedure: INTRAMEDULLARY (IM) NAIL INTERTROCHANTRIC;  Surgeon: Marcus Duda V, MD;  Location: [facility];  Service: Orthopedics;  Laterality: Left;  . Hip surgery Left 12/20/2013    There were no vitals filed for this visit.          [facility] OT Assessment - 09/30/15 1435    Assessment   Diagnosis Left Shoulder Fracture   Precautions   Precautions None    AROM   Overall AROM Comments Assessed seated, ER/IR adducted   AROM Assessment Site Shoulder   Right/Left Shoulder Left   Left Shoulder Flexion 90 Degrees  previous: 76   Left Shoulder ABduction 84 Degrees  previous: 58   Left Shoulder Internal Rotation 90 Degrees  previous: 90   Left Shoulder External Rotation 67 Degrees  previous: 34   PROM   Overall PROM Comments Assessed supine, ER/IR adducted   PROM Assessment Site Shoulder   Right/Left Shoulder Left   Left Shoulder Flexion 110 Degrees  previous: 77   Left Shoulder ABduction 119 Degrees  previous: 75   Left Shoulder Internal Rotation 90 Degrees  previous: 90   Left Shoulder External Rotation 55 Degrees  previous: 45   Strength   Overall Strength Comments Assessed seated, ER/IR adducted    Strength Assessment Site Shoulder   Right/Left Shoulder Left   Left Shoulder Flexion 3-/5  previous: same   Left Shoulder ABduction 3-/5  previous: same   Left Shoulder Internal Rotation 3+/5  previous: 3/5   Left Shoulder External Rotation 3+/5  previous: 3/5                  OT Treatments/Exercises (OP) - 09/30/15 1448    Exercises   Exercises   Shoulder   Shoulder Exercises: Supine   Protraction PROM;5 reps;AAROM;12 reps   Horizontal ABduction PROM;5 reps;AAROM;12 reps   External Rotation PROM;5 reps;AAROM;12 reps   Internal Rotation PROM;5 reps;AAROM;12 reps   Flexion PROM;5 reps;AAROM;12 reps   ABduction PROM;5 reps;AAROM;12 reps   Shoulder Exercises: Seated   Protraction AAROM;12 reps   Horizontal ABduction AAROM;12 reps   External Rotation AAROM;12 reps   Internal Rotation AAROM;12 reps   Flexion AAROM;12 reps   Abduction AAROM;12 reps   Shoulder Exercises: ROM/Strengthening   Wall Wash 2'   Manual Therapy   Manual Therapy Myofascial release;Muscle Energy Technique   Manual therapy comments Manual therapy completed prior to therapeutic exercise this date   Myofascial Release Myofascial release to left  upper arm, trapezius, and scapularis regions to decrease pain and fascial restrictions and increase joint range of motion   Muscle Energy Technique Muscle energy technique used with left medial deltoid to relax tone and improve range of motion.                  OT Short Term Goals - 09/30/15 1459    OT SHORT TERM GOAL #1   Title Pt will be educated on HEP.    Time 4   Period Weeks   Status Achieved   OT SHORT TERM GOAL #2   Title Pt will decrease pain in LUE to 5/10 or less to increase ability to complete daily tasks using LUE as assist.    Time 4   Period Weeks   Status Achieved   OT SHORT TERM GOAL #3   Title Pt will decrease fascial restictions in the LUE to mod amounts or less to increase mobility during manual stretching.    Time 4   Period Weeks   Status Achieved   OT SHORT TERM GOAL #4   Title Pt will increase P/ROM to [facility] to increase ability to use LUE as assist when donning shirts.    Time 4   Period Weeks   Status On-going   OT SHORT TERM GOAL #5   Title Pt will increase strength in LUE to 3+/5 to increase ability to use LUE as assist when preparing meals.    Time 4   Period Weeks   Status Partially Met           OT Long Term Goals - 09/10/15 2334    OT LONG TERM GOAL #1   Title Pt will return to prior level of functioning and independence in daily tasks.    Time 8   Period Weeks   Status On-going   OT LONG TERM GOAL #2   Title Pt will decrease pain in LUE to 2/10 or less to increase ability to complete functional tasks with LUE.    Time 8   Period Weeks   Status On-going   OT LONG TERM GOAL #3   Title Pt will decrease fascial restrictions in LUE to min amounts or less to increase mobility in the LUE during daily tasks.    Time 8   Period Weeks   Status On-going   OT LONG TERM GOAL #4   Title Pt will increase A/ROM to [facility] to increase ability to wash and roll her hair.    Time 8   Period Weeks   Status On-going   OT LONG TERM GOAL #5    Title Pt will increase strength in LUE to 4/5 to increase ability to push her wheelbarrow when picking up sticks in the  yard.    Time 8   Period Weeks   Status On-going               Plan - Oct 02, 2015 1527    Clinical Impression Statement A: Mini reassessment completed this date. Patient has met 3/5 short term goals. Patient has made progress mainly with passive and active ROM and pain level. Patient continues to have deficits with strength and ROM as she appears to push against therapist during manual stretching and we are unable to complete full ROM. Pt reports that it hurts during manual stretching. Patient reports that she notices improvements with reaching and her pain level has decreased during daily tasks.    Plan P: Continue with theraband and add UBE bike.       Patient will benefit from skilled therapeutic intervention in order to improve the following deficits and impairments:  Decreased strength, Pain, Impaired UE functional use, Increased fascial restricitons, Decreased range of motion, Impaired flexibility  Visit Diagnosis: Stiffness of left shoulder, not elsewhere classified  Other symptoms and signs involving the musculoskeletal system      G-Codes - 2015-10-02 1529    Functional Assessment Tool Used FOTO score: 57/100 (43% impaired)   Functional Limitation Carrying, moving and handling objects   Carrying, Moving and Handling Objects Current Status (B5597) At least 40 percent but less than 60 percent impaired, limited or restricted   Carrying, Moving and Handling Objects Goal Status (C1638) At least 20 percent but less than 40 percent impaired, limited or restricted      Problem List Patient Active Problem List   Diagnosis Date Noted  . At high risk for falls 07/05/2015  . Shoulder fracture, left 06/07/2015  . Closed fracture of orbital floor ([place]) 06/07/2015  . CKD (chronic kidney disease) stage 3, GFR 30-59 ml/min 05/24/2015  . Right kidney stone 02/22/2015   . Intertrochanteric fracture of left hip ([place]) 01/25/2014  . Hyperglycemia 03/25/2013  . MCI (mild cognitive impairment) 10/23/2012  . Esophageal dysphagia 08/31/2011  . Hyperlipemia 08/13/2007  . GERD 08/13/2007  . Osteoporosis 08/13/2007  . INTERMITTENT VERTIGO 08/13/2007    Ailene Ravel, OTR/L,CBIS  [phone]  10-02-15, 3:33 PM  [place] [address] [place], [place], [address] Phone: [phone]   Fax:  [phone]  Name: ADARIA HOLE MRN: [id number] Date of Birth: 1920-09-14

## 2015-10-05 ENCOUNTER — Ambulatory Visit (HOSPITAL_COMMUNITY): Payer: Medicare Other

## 2015-10-05 DIAGNOSIS — M25512 Pain in left shoulder: Secondary | ICD-10-CM | POA: Diagnosis not present

## 2015-10-05 DIAGNOSIS — R29898 Other symptoms and signs involving the musculoskeletal system: Secondary | ICD-10-CM

## 2015-10-05 DIAGNOSIS — M25612 Stiffness of left shoulder, not elsewhere classified: Secondary | ICD-10-CM

## 2015-10-05 DIAGNOSIS — M6281 Muscle weakness (generalized): Secondary | ICD-10-CM | POA: Diagnosis not present

## 2015-10-05 NOTE — Therapy (Signed)
[place], [place], [address] Phone: [phone]   Fax:  [phone]  Occupational Therapy Treatment  Patient Details  Name: Kristy Jarvis MRN: [id number] Date of Birth: Sep 02, 1920 Referring Provider: Dr. Arther Abbott  Encounter Date: 10/05/2015      OT End of Session - 10/05/15 1608    Visit Number 9   Number of Visits 16   Date for OT Re-Evaluation 11/02/15   Authorization Type [employer] Medicare   Authorization Time Period Before 18th visit   Authorization - Visit Number 9   Authorization - Number of Visits 18   OT Start Time 1520   OT Stop Time 1600   OT Time Calculation (min) 40 min   Activity Tolerance Patient tolerated treatment well   Behavior During Therapy [facility] for tasks assessed/performed      Past Medical History  Diagnosis Date  . Intermittent vertigo   . Renal colic   . Osteoporosis   . Hyperlipidemia   . GERD (gastroesophageal reflux disease)   . Diverticulosis of colon     by CT  . PONV (postoperative nausea and vomiting)   . Cataract 2013    right, no surgery at this time    Past Surgical History  Procedure Laterality Date  . Cataract extraction  2002    left eye   . Cholecystectomy  1989  . Abdominal hysterectomy    . Esophagogastroduodenoscopy  Jan  2007    prominent schatzki's ring, s/p 18F, small to moderate size hh  . Bunionectomy  06/27/2011    left , Dr Berline Lopes  . Eye surgery  2005 approx    cataract left  . Intramedullary (im) nail intertrochanteric Left 01/02/2014    Procedure: INTRAMEDULLARY (IM) NAIL INTERTROCHANTRIC;  Surgeon: Newt Minion, MD;  Location: [place];  Service: Orthopedics;  Laterality: Left;  . Hip surgery Left 12/20/2013    There were no vitals filed for this visit.                    OT Treatments/Exercises (OP) - 10/05/15 1533    Exercises   Exercises Shoulder   Shoulder Exercises: Supine   Protraction PROM;5 reps;AAROM;15  reps   Horizontal ABduction PROM;5 reps;AAROM;15 reps   External Rotation PROM;5 reps;AAROM;15 reps   Internal Rotation PROM;5 reps;AAROM;15 reps   Flexion PROM;5 reps;AAROM;15 reps   ABduction PROM;5 reps;AAROM;15 reps   Shoulder Exercises: Seated   Extension Theraband;10 reps   Theraband Level (Shoulder Extension) Level 2 (Red)   Row Theraband;10 reps   Theraband Level (Shoulder Row) Level 2 (Red)   Protraction AAROM;12 reps   Horizontal ABduction AAROM;12 reps   External Rotation AAROM;12 reps   Internal Rotation AAROM;12 reps   Flexion AAROM;12 reps   Abduction AAROM;12 reps   Shoulder Exercises: Pulleys   Flexion 1 minute   ABduction 1 minute   Shoulder Exercises: ROM/Strengthening   UBE (Upper Arm Bike) Level 1 2' forward 2' reverse   Wall Wash 2'   Proximal Shoulder Strengthening, Supine 10X each no rest breaks   Proximal Shoulder Strengthening, Seated 10X   Manual Therapy   Manual Therapy Myofascial release   Manual therapy comments Manual therapy completed prior to therapeutic exercise this date   Myofascial Release Myofascial release to left upper arm, trapezius, and scapularis regions to decrease pain and fascial restrictions and increase joint range of motion  OT Short Term Goals - 10/05/15 1609    OT SHORT TERM GOAL #1   Title Pt will be educated on HEP.    Time 4   Period Weeks   OT SHORT TERM GOAL #2   Title Pt will decrease pain in LUE to 5/10 or less to increase ability to complete daily tasks using LUE as assist.    Time 4   Period Weeks   OT SHORT TERM GOAL #3   Title Pt will decrease fascial restictions in the LUE to mod amounts or less to increase mobility during manual stretching.    Time 4   Period Weeks   OT SHORT TERM GOAL #4   Title Pt will increase P/ROM to [facility] to increase ability to use LUE as assist when donning shirts.    Time 4   Period Weeks   Status On-going   OT SHORT TERM GOAL #5   Title Pt will increase  strength in LUE to 3+/5 to increase ability to use LUE as assist when preparing meals.    Time 4   Period Weeks   Status Partially Met           OT Long Term Goals - 09/10/15 2334    OT LONG TERM GOAL #1   Title Pt will return to prior level of functioning and independence in daily tasks.    Time 8   Period Weeks   Status On-going   OT LONG TERM GOAL #2   Title Pt will decrease pain in LUE to 2/10 or less to increase ability to complete functional tasks with LUE.    Time 8   Period Weeks   Status On-going   OT LONG TERM GOAL #3   Title Pt will decrease fascial restrictions in LUE to min amounts or less to increase mobility in the LUE during daily tasks.    Time 8   Period Weeks   Status On-going   OT LONG TERM GOAL #4   Title Pt will increase A/ROM to [facility] to increase ability to wash and roll her hair.    Time 8   Period Weeks   Status On-going   OT LONG TERM GOAL #5   Title Pt will increase strength in LUE to 4/5 to increase ability to push her wheelbarrow when picking up sticks in the yard.    Time 8   Period Weeks   Status On-going               Plan - 10/05/15 1608    Clinical Impression Statement A: Added UBE bike this session and continued with theraband exercises. Patient completed all exercises with min VC for form and technique.    Plan P: Complete functional reaching task with resistive clothespins.      Patient will benefit from skilled therapeutic intervention in order to improve the following deficits and impairments:  Decreased strength, Pain, Impaired UE functional use, Increased fascial restricitons, Decreased range of motion, Impaired flexibility  Visit Diagnosis: Stiffness of left shoulder, not elsewhere classified  Other symptoms and signs involving the musculoskeletal system    Problem List Patient Active Problem List   Diagnosis Date Noted  . At high risk for falls 07/05/2015  . Shoulder fracture, left 06/07/2015  . Closed fracture  of orbital floor ([place]) 06/07/2015  . CKD (chronic kidney disease) stage 3, GFR 30-59 ml/min 05/24/2015  . Right kidney stone 02/22/2015  . Intertrochanteric fracture of left hip ([place]) 01/25/2014  .  Hyperglycemia 03/25/2013  . MCI (mild cognitive impairment) 10/23/2012  . Esophageal dysphagia 08/31/2011  . Hyperlipemia 08/13/2007  . GERD 08/13/2007  . Osteoporosis 08/13/2007  . INTERMITTENT VERTIGO 08/13/2007    Ailene Ravel, OTR/L,CBIS  [phone]  10/05/2015, 4:12 PM  [place] [address] [place], [place], [address] Phone: [phone]   Fax:  [phone]  Name: SIRIA CALANDRO MRN: [id number] Date of Birth: 16-Jul-1920

## 2015-10-07 ENCOUNTER — Other Ambulatory Visit: Payer: Self-pay | Admitting: Family Medicine

## 2015-10-07 ENCOUNTER — Ambulatory Visit (HOSPITAL_COMMUNITY): Payer: Medicare Other | Admitting: Occupational Therapy

## 2015-10-11 ENCOUNTER — Encounter (HOSPITAL_COMMUNITY): Payer: Self-pay

## 2015-10-11 ENCOUNTER — Ambulatory Visit (HOSPITAL_COMMUNITY): Payer: Medicare Other

## 2015-10-11 DIAGNOSIS — M25612 Stiffness of left shoulder, not elsewhere classified: Secondary | ICD-10-CM | POA: Diagnosis not present

## 2015-10-11 DIAGNOSIS — R29898 Other symptoms and signs involving the musculoskeletal system: Secondary | ICD-10-CM | POA: Diagnosis not present

## 2015-10-11 DIAGNOSIS — M6281 Muscle weakness (generalized): Secondary | ICD-10-CM | POA: Diagnosis not present

## 2015-10-11 DIAGNOSIS — M25512 Pain in left shoulder: Secondary | ICD-10-CM | POA: Diagnosis not present

## 2015-10-11 NOTE — Therapy (Signed)
[place], [place], [address] Phone: [phone]   Fax:  [phone]  Occupational Therapy Treatment  Patient Details  Name: Kristy Jarvis MRN: [id number] Date of Birth: 1920-11-30 Referring Provider: Dr. Arther Abbott  Encounter Date: 10/11/2015      OT End of Session - 10/11/15 1454    Visit Number 10   Number of Visits 16   Date for OT Re-Evaluation 11/02/15   Authorization Type [employer] Medicare   Authorization Time Period Before 18th visit   Authorization - Visit Number 10   Authorization - Number of Visits 18   OT Start Time 1345   OT Stop Time 1430   OT Time Calculation (min) 45 min   Activity Tolerance Patient tolerated treatment well   Behavior During Therapy [facility] for tasks assessed/performed      Past Medical History  Diagnosis Date  . Intermittent vertigo   . Renal colic   . Osteoporosis   . Hyperlipidemia   . GERD (gastroesophageal reflux disease)   . Diverticulosis of colon     by CT  . PONV (postoperative nausea and vomiting)   . Cataract 2013    right, no surgery at this time    Past Surgical History  Procedure Laterality Date  . Cataract extraction  2002    left eye   . Cholecystectomy  1989  . Abdominal hysterectomy    . Esophagogastroduodenoscopy  Jan  2007    prominent schatzki's ring, s/p 20F, small to moderate size hh  . Bunionectomy  06/27/2011    left , Dr Berline Lopes  . Eye surgery  2005 approx    cataract left  . Intramedullary (im) nail intertrochanteric Left 01/02/2014    Procedure: INTRAMEDULLARY (IM) NAIL INTERTROCHANTRIC;  Surgeon: Newt Minion, MD;  Location: [place];  Service: Orthopedics;  Laterality: Left;  . Hip surgery Left 12/20/2013    There were no vitals filed for this visit.      Subjective Assessment - 10/11/15 1400    Subjective  S: My arm is ok today.   Currently in Pain? No/denies            [facility] OT Assessment - 10/11/15 1400     Assessment   Diagnosis Left Shoulder Fracture   Precautions   Precautions None                  OT Treatments/Exercises (OP) - 10/11/15 1400    Exercises   Exercises Shoulder   Shoulder Exercises: Supine   Protraction PROM;5 reps;AAROM;15 reps   Horizontal ABduction PROM;5 reps;AAROM;15 reps   External Rotation PROM;5 reps;AAROM;15 reps   Internal Rotation PROM;5 reps;AAROM;15 reps   Flexion PROM;5 reps;AAROM;15 reps   ABduction PROM;5 reps;AAROM;15 reps   Shoulder Exercises: Seated   Extension Theraband;12 reps   Theraband Level (Shoulder Extension) Level 2 (Red)   Row Theraband;12 reps   Theraband Level (Shoulder Row) Level 2 (Red)   Protraction AAROM;15 reps   Horizontal ABduction AAROM;15 reps   External Rotation AAROM;15 reps   Internal Rotation AAROM;15 reps   Flexion AAROM;15 reps   Abduction AAROM;15 reps   Shoulder Exercises: Pulleys   Flexion 1 minute   ABduction 1 minute   Shoulder Exercises: ROM/Strengthening   UBE (Upper Arm Bike) Level 1 2' forward 2' reverse   Proximal Shoulder Strengthening, Supine 10X each no rest breaks   Proximal Shoulder Strengthening, Seated 10X no rest breaks   Functional Reaching Activities  High Level Resistive clothespins completed sitting to increase functional reaching ability. Pt was able to reach 12 inches   Manual Therapy   Manual Therapy Myofascial release   Manual therapy comments Manual therapy completed prior to therapeutic exercise this date   Myofascial Release Myofascial release to left upper arm, trapezius, and scapularis regions to decrease pain and fascial restrictions and increase joint range of motion                  OT Short Term Goals - 10/05/15 1609    OT SHORT TERM GOAL #1   Title Pt will be educated on HEP.    Time 4   Period Weeks   OT SHORT TERM GOAL #2   Title Pt will decrease pain in LUE to 5/10 or less to increase ability to complete daily tasks using LUE as assist.    Time 4    Period Weeks   OT SHORT TERM GOAL #3   Title Pt will decrease fascial restictions in the LUE to mod amounts or less to increase mobility during manual stretching.    Time 4   Period Weeks   OT SHORT TERM GOAL #4   Title Pt will increase P/ROM to [facility] to increase ability to use LUE as assist when donning shirts.    Time 4   Period Weeks   Status On-going   OT SHORT TERM GOAL #5   Title Pt will increase strength in LUE to 3+/5 to increase ability to use LUE as assist when preparing meals.    Time 4   Period Weeks   Status Partially Met           OT Long Term Goals - 09/10/15 2334    OT LONG TERM GOAL #1   Title Pt will return to prior level of functioning and independence in daily tasks.    Time 8   Period Weeks   Status On-going   OT LONG TERM GOAL #2   Title Pt will decrease pain in LUE to 2/10 or less to increase ability to complete functional tasks with LUE.    Time 8   Period Weeks   Status On-going   OT LONG TERM GOAL #3   Title Pt will decrease fascial restrictions in LUE to min amounts or less to increase mobility in the LUE during daily tasks.    Time 8   Period Weeks   Status On-going   OT LONG TERM GOAL #4   Title Pt will increase A/ROM to [facility] to increase ability to wash and roll her hair.    Time 8   Period Weeks   Status On-going   OT LONG TERM GOAL #5   Title Pt will increase strength in LUE to 4/5 to increase ability to push her wheelbarrow when picking up sticks in the yard.    Time 8   Period Weeks   Status On-going               Plan - 10/11/15 1454    Clinical Impression Statement A: Added functional reaching task with clothespins. Patient was able to reach 12 inches on vertical pole. VC for form and technique needed for exercise completion.   Plan P: Add PVC pipe activity in corner to increase flexion and abduction.      Patient will benefit from skilled therapeutic intervention in order to improve the following deficits and  impairments:  Decreased strength, Pain, Impaired UE functional use, Increased fascial  restricitons, Decreased range of motion, Impaired flexibility  Visit Diagnosis: Stiffness of left shoulder, not elsewhere classified  Other symptoms and signs involving the musculoskeletal system    Problem List Patient Active Problem List   Diagnosis Date Noted  . At high risk for falls 07/05/2015  . Shoulder fracture, left 06/07/2015  . Closed fracture of orbital floor ([place]) 06/07/2015  . CKD (chronic kidney disease) stage 3, GFR 30-59 ml/min 05/24/2015  . Right kidney stone 02/22/2015  . Intertrochanteric fracture of left hip ([place]) 01/25/2014  . Hyperglycemia 03/25/2013  . MCI (mild cognitive impairment) 10/23/2012  . Esophageal dysphagia 08/31/2011  . Hyperlipemia 08/13/2007  . GERD 08/13/2007  . Osteoporosis 08/13/2007  . INTERMITTENT VERTIGO 08/13/2007   Ailene Ravel, OTR/L,CBIS  [phone]  10/11/2015, 2:56 PM  [facility] [address] [place], [place], [address] Phone: [phone]   Fax:  [phone]  Name: KIZZIE COTTEN MRN: [id number] Date of Birth: February 26, 1921

## 2015-10-13 ENCOUNTER — Ambulatory Visit (HOSPITAL_COMMUNITY): Payer: Medicare Other | Admitting: Occupational Therapy

## 2015-10-13 DIAGNOSIS — M25612 Stiffness of left shoulder, not elsewhere classified: Secondary | ICD-10-CM | POA: Diagnosis not present

## 2015-10-13 DIAGNOSIS — R29898 Other symptoms and signs involving the musculoskeletal system: Secondary | ICD-10-CM

## 2015-10-13 DIAGNOSIS — M25512 Pain in left shoulder: Secondary | ICD-10-CM | POA: Diagnosis not present

## 2015-10-13 DIAGNOSIS — M6281 Muscle weakness (generalized): Secondary | ICD-10-CM | POA: Diagnosis not present

## 2015-10-13 NOTE — Therapy (Signed)
[place], [place], [address] Phone: [phone]   Fax:  [phone]  Occupational Therapy Treatment  Patient Details  Name: Kristy Jarvis MRN: [id number] Date of Birth: 15-Dec-1920 Referring Provider: Dr. Arther Abbott  Encounter Date: 10/13/2015      OT End of Session - 10/13/15 1538    Visit Number 11   Number of Visits 16   Date for OT Re-Evaluation 11/02/15   Authorization Type [employer] Medicare   Authorization Time Period Before 18th visit   Authorization - Visit Number 11   Authorization - Number of Visits 18   OT Start Time 1430   OT Stop Time 1518   OT Time Calculation (min) 48 min   Activity Tolerance Patient tolerated treatment well   Behavior During Therapy [facility] for tasks assessed/performed      Past Medical History  Diagnosis Date  . Intermittent vertigo   . Renal colic   . Osteoporosis   . Hyperlipidemia   . GERD (gastroesophageal reflux disease)   . Diverticulosis of colon     by CT  . PONV (postoperative nausea and vomiting)   . Cataract 2013    right, no surgery at this time    Past Surgical History  Procedure Laterality Date  . Cataract extraction  2002    left eye   . Cholecystectomy  1989  . Abdominal hysterectomy    . Esophagogastroduodenoscopy  Jan  2007    prominent schatzki's ring, s/p 65F, small to moderate size hh  . Bunionectomy  06/27/2011    left , Dr Berline Lopes  . Eye surgery  2005 approx    cataract left  . Intramedullary (im) nail intertrochanteric Left 01/02/2014    Procedure: INTRAMEDULLARY (IM) NAIL INTERTROCHANTRIC;  Surgeon: Newt Minion, MD;  Location: [place];  Service: Orthopedics;  Laterality: Left;  . Hip surgery Left 12/20/2013    There were no vitals filed for this visit.      Subjective Assessment - 10/13/15 1438    Subjective  S: I'm doing pretty good today, I'm not having any pain   Pertinent History Pt is a 82 y/o female s/p left shoulder  fracture on 06/04/2015 when she fell on the wet floor at the hospital getting off the elevator to go visit someone. Pt received HHPT and HHOT services until approximately 2 weeks ago. Pt was referred to occupational therapy for evaluation and treatment by Dr. Arther Abbott.    Patient Stated Goals To be able to use my left arm again   Currently in Pain? No/denies            [facility] OT Assessment - 10/13/15 1438    Assessment   Diagnosis Left Shoulder Fracture   Precautions   Precautions None                  OT Treatments/Exercises (OP) - 10/13/15 0001    Exercises   Exercises Shoulder   Shoulder Exercises: Supine   Protraction PROM;5 reps;AAROM;15 reps   Horizontal ABduction PROM;5 reps;AAROM;15 reps   External Rotation PROM;5 reps;AAROM;15 reps   Internal Rotation PROM;5 reps;AAROM;15 reps   Flexion PROM;5 reps;AAROM;15 reps   ABduction PROM;5 reps;AAROM;15 reps   Shoulder Exercises: Seated   Extension Theraband;12 reps   Theraband Level (Shoulder Extension) Level 2 (Red)   Protraction AAROM;15 reps   Horizontal ABduction AAROM;15 reps   External Rotation AAROM;15 reps   Internal Rotation AAROM;15 reps   Flexion  AAROM;15 reps   Abduction AAROM;15 reps   Shoulder Exercises: Standing   Other Standing Exercises PVC pipe slide; X15; LUE   Shoulder Exercises: ROM/Strengthening   UBE (Upper Arm Bike) Level 1 2' forward 2' reverse   Wall Wash 4'  2' flexion/extenstion, 2' horizontal ab/adduction   Manual Therapy   Manual Therapy Myofascial release;Muscle Energy Technique   Manual therapy comments Manual therapy completed prior to therapeutic exercise this date   Myofascial Release Myofascial release to left upper arm, trapezius, and scapularis regions to decrease pain and fascial restrictions and increase joint range of motion   Muscle Energy Technique Used to relax tone and increase range of motion                   OT Short Term Goals - 10/05/15 1609     OT SHORT TERM GOAL #1   Title Pt will be educated on HEP.    Time 4   Period Weeks   OT SHORT TERM GOAL #2   Title Pt will decrease pain in LUE to 5/10 or less to increase ability to complete daily tasks using LUE as assist.    Time 4   Period Weeks   OT SHORT TERM GOAL #3   Title Pt will decrease fascial restictions in the LUE to mod amounts or less to increase mobility during manual stretching.    Time 4   Period Weeks   OT SHORT TERM GOAL #4   Title Pt will increase P/ROM to [facility] to increase ability to use LUE as assist when donning shirts.    Time 4   Period Weeks   Status On-going   OT SHORT TERM GOAL #5   Title Pt will increase strength in LUE to 3+/5 to increase ability to use LUE as assist when preparing meals.    Time 4   Period Weeks   Status Partially Met           OT Long Term Goals - 09/10/15 2334    OT LONG TERM GOAL #1   Title Pt will return to prior level of functioning and independence in daily tasks.    Time 8   Period Weeks   Status On-going   OT LONG TERM GOAL #2   Title Pt will decrease pain in LUE to 2/10 or less to increase ability to complete functional tasks with LUE.    Time 8   Period Weeks   Status On-going   OT LONG TERM GOAL #3   Title Pt will decrease fascial restrictions in LUE to min amounts or less to increase mobility in the LUE during daily tasks.    Time 8   Period Weeks   Status On-going   OT LONG TERM GOAL #4   Title Pt will increase A/ROM to [facility] to increase ability to wash and roll her hair.    Time 8   Period Weeks   Status On-going   OT LONG TERM GOAL #5   Title Pt will increase strength in LUE to 4/5 to increase ability to push her wheelbarrow when picking up sticks in the yard.    Time 8   Period Weeks   Status On-going               Plan - 10/13/15 1534    Clinical Impression Statement A: Pt continues to be limited by fascial restricitions which impacts her independence with functional tasks. Incorporated  PVC pipe slides this session and  continued her AAROM exercises and activities she's been performing. Pt making slow, but steady progress towards OT goals.    Plan P: Continue myofascial release, P/ROM & AA/ROM exercises, pt does well on arm bike, incorporate some functional reaching tasks      Patient will benefit from skilled therapeutic intervention in order to improve the following deficits and impairments:  Decreased strength, Pain, Impaired UE functional use, Increased fascial restricitons, Decreased range of motion, Impaired flexibility  Visit Diagnosis: Stiffness of left shoulder, not elsewhere classified  Other symptoms and signs involving the musculoskeletal system  Pain in left shoulder    Problem List Patient Active Problem List   Diagnosis Date Noted  . At high risk for falls 07/05/2015  . Shoulder fracture, left 06/07/2015  . Closed fracture of orbital floor ([place]) 06/07/2015  . CKD (chronic kidney disease) stage 3, GFR 30-59 ml/min 05/24/2015  . Right kidney stone 02/22/2015  . Intertrochanteric fracture of left hip ([place]) 01/25/2014  . Hyperglycemia 03/25/2013  . MCI (mild cognitive impairment) 10/23/2012  . Esophageal dysphagia 08/31/2011  . Hyperlipemia 08/13/2007  . GERD 08/13/2007  . Osteoporosis 08/13/2007  . INTERMITTENT VERTIGO 08/13/2007    Chrys Racer , MS, OTR/L, CLT  10/13/2015, 3:43 PM  [facility] [address] [place], [place], [address] Phone: [phone]   Fax:  [phone]  Name: Kristy Jarvis MRN: [id number] Date of Birth: 11/04/1920

## 2015-10-19 ENCOUNTER — Ambulatory Visit (HOSPITAL_COMMUNITY): Payer: Medicare Other | Admitting: Occupational Therapy

## 2015-10-19 ENCOUNTER — Other Ambulatory Visit: Payer: Self-pay | Admitting: Family Medicine

## 2015-10-19 DIAGNOSIS — R29898 Other symptoms and signs involving the musculoskeletal system: Secondary | ICD-10-CM | POA: Diagnosis not present

## 2015-10-19 DIAGNOSIS — M25512 Pain in left shoulder: Secondary | ICD-10-CM

## 2015-10-19 DIAGNOSIS — M6281 Muscle weakness (generalized): Secondary | ICD-10-CM | POA: Diagnosis not present

## 2015-10-19 DIAGNOSIS — M25612 Stiffness of left shoulder, not elsewhere classified: Secondary | ICD-10-CM

## 2015-10-19 NOTE — Therapy (Signed)
[place], [place], [address] Phone: [phone]   Fax:  [phone]  Occupational Therapy Treatment  Patient Details  Name: Kristy Jarvis MRN: [id number] Date of Birth: [birth year] Referring Provider: Dr. Arther Abbott  Encounter Date: 10/19/2015      OT End of Session - 10/19/15 1624    Visit Number 12   Number of Visits 16   Date for OT Re-Evaluation 11/02/15   Authorization Type [employer] Medicare   Authorization Time Period Before 18th visit   Authorization - Visit Number 12   Authorization - Number of Visits 18   OT Start Time 1435   OT Stop Time 1518   OT Time Calculation (min) 43 min   Activity Tolerance Patient tolerated treatment well   Behavior During Therapy [facility] for tasks assessed/performed      Past Medical History  Diagnosis Date  . Intermittent vertigo   . Renal colic   . Osteoporosis   . Hyperlipidemia   . GERD (gastroesophageal reflux disease)   . Diverticulosis of colon     by CT  . PONV (postoperative nausea and vomiting)   . Cataract 2013    right, no surgery at this time    Past Surgical History  Procedure Laterality Date  . Cataract extraction  2002    left eye   . Cholecystectomy  1989  . Abdominal hysterectomy    . Esophagogastroduodenoscopy  Jan  2007    prominent schatzki's ring, s/p 27F, small to moderate size hh  . Bunionectomy  06/27/2011    left , Dr Berline Lopes  . Eye surgery  2005 approx    cataract left  . Intramedullary (im) nail intertrochanteric Left 01/02/2014    Procedure: INTRAMEDULLARY (IM) NAIL INTERTROCHANTRIC;  Surgeon: Newt Minion, MD;  Location: [place];  Service: Orthopedics;  Laterality: Left;  . Hip surgery Left 12/20/2013    There were no vitals filed for this visit.      Subjective Assessment - 10/19/15 1439    Subjective  S: I'm doing alright, no pain. I had a busy weekend and next weekend's going to be busy too.    Pertinent History Kristy Jarvis is a  82 y/o female s/p left shoulder fracture on 06/04/2015 when she fell on the wet floor at the hospital getting off the elevator to go visit someone. Kristy Jarvis received HHPT and HHOT services until approximately 2 weeks ago. Kristy Jarvis was referred to occupational therapy for evaluation and treatment by Dr. Arther Abbott.    Patient Stated Goals To be able to use my left arm again   Currently in Pain? No/denies            [facility] OT Assessment - 10/19/15 0001    Assessment   Diagnosis Left Shoulder Fracture   Precautions   Precautions None                  OT Treatments/Exercises (OP) - 10/19/15 0001    Exercises   Exercises Shoulder   Shoulder Exercises: Supine   Protraction PROM;5 reps;AAROM;15 reps   Horizontal ABduction PROM;5 reps;AAROM;15 reps   External Rotation PROM;5 reps;AAROM;15 reps   Internal Rotation PROM;5 reps;AAROM;15 reps   Flexion PROM;5 reps;AAROM;15 reps   ABduction PROM;5 reps;AAROM;15 reps   Shoulder Exercises: Seated   Protraction Strengthening;10 reps   Protraction Weight (lbs) 1   Horizontal ABduction Strengthening;10 reps   Horizontal ABduction Weight (lbs) 1   External Rotation Strengthening;10 reps  External Rotation Weight (lbs) 1   Internal Rotation Strengthening;10 reps   Internal Rotation Weight (lbs) 1   Flexion Strengthening;10 reps   Flexion Weight (lbs) 1   Abduction Strengthening;10 reps   ABduction Weight (lbs) 1   Shoulder Exercises: Standing   Other Standing Exercises PVC pipe slide; X15; LUE   Shoulder Exercises: ROM/Strengthening   UBE (Upper Arm Bike) Level 2 2' forward 2' reverse   Thumb Tacks 1' flexion, 1' abduction   Other ROM/Strengthening Exercises Proximal shoulder strengthening; standing; flexion 1' and abduction 1'; LUE   Manual Therapy   Manual Therapy Myofascial release;Muscle Energy Technique   Manual therapy comments Manual therapy completed prior to therapeutic exercise this date   Myofascial Release Myofascial  release to left upper arm, trapezius, and scapularis regions to decrease pain and fascial restrictions and increase joint range of motion   Muscle Energy Technique Used to relax tone and increase range of motion                   OT Short Term Goals - 10/19/15 1623    OT SHORT TERM GOAL #1   Title Kristy Jarvis will be educated on HEP.    Time 4   Period Weeks   OT SHORT TERM GOAL #2   Title Kristy Jarvis will decrease pain in LUE to 5/10 or less to increase ability to complete daily tasks using LUE as assist.    Time 4   Period Weeks   OT SHORT TERM GOAL #3   Title Kristy Jarvis will decrease fascial restictions in the LUE to mod amounts or less to increase mobility during manual stretching.    Time 4   Period Weeks   OT SHORT TERM GOAL #4   Title Kristy Jarvis will increase P/ROM to [facility] to increase ability to use LUE as assist when donning shirts.    Time 4   Period Weeks   Status On-going   OT SHORT TERM GOAL #5   Title Kristy Jarvis will increase strength in LUE to 3+/5 to increase ability to use LUE as assist when preparing meals.    Time 4   Period Weeks   Status Partially Met           OT Long Term Goals - 10/19/15 1624    OT LONG TERM GOAL #1   Title Kristy Jarvis will return to prior level of functioning and independence in daily tasks.    Time 8   Period Weeks   Status On-going   OT LONG TERM GOAL #2   Title Kristy Jarvis will decrease pain in LUE to 2/10 or less to increase ability to complete functional tasks with LUE.    Time 8   Period Weeks   Status On-going   OT LONG TERM GOAL #3   Title Kristy Jarvis will decrease fascial restrictions in LUE to min amounts or less to increase mobility in the LUE during daily tasks.    Time 8   Period Weeks   Status On-going   OT LONG TERM GOAL #4   Title Kristy Jarvis will increase A/ROM to [facility] to increase ability to wash and roll her hair.    Time 8   Period Weeks   Status On-going   OT LONG TERM GOAL #5   Title Kristy Jarvis will increase strength in LUE to 4/5 to increase ability to push her wheelbarrow  when picking up sticks in the yard.    Time 8   Period Weeks   Status On-going  Plan - 10/19/15 1620    Clinical Impression Statement A: Kristy Jarvis continues to make progress towards OT goals, continue plan of care for now. Fascial restrcitions limit patient and this impacts her independence with functional tasks. Incorportated 1lb weights this session and increased Kristy Jarvis to level 2 on arm bike.    Rehab Potential Good   OT Frequency 2x / week   OT Duration 8 weeks   OT Treatment/Interventions Self-care/ADL training;Passive range of motion;Patient/family education;Cryotherapy;Moist Heat;Therapeutic exercise;Manual Therapy;Therapeutic activities   Plan P: Continue myofascial release, P/ROM, AA/ROM, strenthening. Incorporate functional reasing tasks.    Consulted and Agree with Plan of Care Patient      Patient will benefit from skilled therapeutic intervention in order to improve the following deficits and impairments:  Decreased strength, Pain, Impaired UE functional use, Increased fascial restricitons, Decreased range of motion, Impaired flexibility  Visit Diagnosis: Stiffness of left shoulder, not elsewhere classified  Other symptoms and signs involving the musculoskeletal system  Pain in left shoulder    Problem List Patient Active Problem List   Diagnosis Date Noted  . At high risk for falls 07/05/2015  . Shoulder fracture, left 06/07/2015  . Closed fracture of orbital floor ([place]) 06/07/2015  . CKD (chronic kidney disease) stage 3, GFR 30-59 ml/min 05/24/2015  . Right kidney stone 02/22/2015  . Intertrochanteric fracture of left hip ([place]) 01/25/2014  . Hyperglycemia 03/25/2013  . MCI (mild cognitive impairment) 10/23/2012  . Esophageal dysphagia 08/31/2011  . Hyperlipemia 08/13/2007  . GERD 08/13/2007  . Osteoporosis 08/13/2007  . INTERMITTENT VERTIGO 08/13/2007    Chrys Racer , MS, OTR/L, CLT  10/19/2015, 4:26 PM  [place], [place], [address] Phone: [phone]   Fax:  [phone]  Name: TYNESHA FREE MRN: [id number] Date of Birth: [birth year]

## 2015-10-21 ENCOUNTER — Other Ambulatory Visit: Payer: Self-pay | Admitting: Family Medicine

## 2015-10-21 ENCOUNTER — Encounter (HOSPITAL_COMMUNITY): Payer: Self-pay

## 2015-10-21 ENCOUNTER — Ambulatory Visit (HOSPITAL_COMMUNITY): Payer: Medicare Other | Attending: Orthopedic Surgery

## 2015-10-21 DIAGNOSIS — M25612 Stiffness of left shoulder, not elsewhere classified: Secondary | ICD-10-CM | POA: Insufficient documentation

## 2015-10-21 DIAGNOSIS — R29898 Other symptoms and signs involving the musculoskeletal system: Secondary | ICD-10-CM | POA: Insufficient documentation

## 2015-10-21 NOTE — Therapy (Signed)
[place], [place], [address] Phone: [phone]   Fax:  [phone]  Occupational Therapy Treatment  Patient Details  Name: Kristy Jarvis MRN: [id number] Date of Birth: 1920-05-31 Referring Provider: Dr. Arther Abbott  Encounter Date: 10/21/2015      OT End of Session - 10/21/15 1508    Visit Number 13   Number of Visits 16   Date for OT Re-Evaluation 11/02/15   Authorization Type UHC Medicare   Authorization Time Period Before 18th visit   Authorization - Visit Number 13   Authorization - Number of Visits 18   OT Start Time 1430   OT Stop Time 1510   OT Time Calculation (min) 40 min   Activity Tolerance Patient tolerated treatment well   Behavior During Therapy [facility] for tasks assessed/performed      Past Medical History  Diagnosis Date  . Intermittent vertigo   . Renal colic   . Osteoporosis   . Hyperlipidemia   . GERD (gastroesophageal reflux disease)   . Diverticulosis of colon     by CT  . PONV (postoperative nausea and vomiting)   . Cataract 2013    right, no surgery at this time    Past Surgical History  Procedure Laterality Date  . Cataract extraction  2002    left eye   . Cholecystectomy  1989  . Abdominal hysterectomy    . Esophagogastroduodenoscopy  Jan  2007    prominent schatzki's ring, s/p 42F, small to moderate size hh  . Bunionectomy  06/27/2011    left , Dr Berline Lopes  . Eye surgery  2005 approx    cataract left  . Intramedullary (im) nail intertrochanteric Left 01/02/2014    Procedure: INTRAMEDULLARY (IM) NAIL INTERTROCHANTRIC;  Surgeon: Newt Minion, MD;  Location: [place];  Service: Orthopedics;  Laterality: Left;  . Hip surgery Left 12/20/2013    There were no vitals filed for this visit.      Subjective Assessment - 10/21/15 1429    Subjective  S: The weight was a little hard but it was ok.   Currently in Pain? No/denies            [facility] OT Assessment -  10/21/15 1430    Assessment   Diagnosis Left Shoulder Fracture   Precautions   Precautions None                  OT Treatments/Exercises (OP) - 10/21/15 1440    Exercises   Exercises Shoulder   Shoulder Exercises: Supine   Protraction PROM;5 reps;Strengthening;10 reps   Protraction Weight (lbs) 1   Horizontal ABduction PROM;5 reps;Strengthening;10 reps   Horizontal ABduction Weight (lbs) 1   External Rotation PROM;5 reps;Strengthening;10 reps   External Rotation Weight (lbs) 1   Internal Rotation PROM;5 reps;Strengthening;10 reps   Internal Rotation Weight (lbs) 1   Flexion PROM;5 reps;Strengthening;10 reps   Shoulder Flexion Weight (lbs) 1   ABduction PROM;5 reps;AROM;10 reps   Shoulder Exercises: Seated   Extension Theraband;12 reps   Theraband Level (Shoulder Extension) Level 2 (Red)   Row Theraband;12 reps   Theraband Level (Shoulder Row) Level 2 (Red)   Protraction Strengthening;10 reps   Protraction Weight (lbs) 1   Horizontal ABduction Strengthening;10 reps   Horizontal ABduction Weight (lbs) 1   External Rotation Strengthening;10 reps   External Rotation Weight (lbs) 1   Internal Rotation Strengthening;10 reps   Internal Rotation Weight (lbs) 1  Flexion Strengthening;10 reps   Flexion Weight (lbs) 1   Abduction AROM;10 reps   Shoulder Exercises: ROM/Strengthening   UBE (Upper Arm Bike) Level 2 2' forward 2' reverse   Over Head Lace 2'   "W" Arms 10X   X to V Arms 10X   Proximal Shoulder Strengthening, Supine 12X each no rest breaks   Proximal Shoulder Strengthening, Seated 12X no rest breaks   Functional Reaching Activities   High Level Resistive clothespins completed seated to increase functional reaching ability. Pt was able to reach 15 inches although required VC to not lean and perform abduction once flexion became difficult.    Manual Therapy   Manual Therapy Myofascial release;Muscle Energy Technique   Manual therapy comments Manual therapy  completed prior to therapeutic exercise this date   Myofascial Release Myofascial release to left upper arm, trapezius, and scapularis regions to decrease pain and fascial restrictions and increase joint range of motion   Muscle Energy Technique Muscle energy technique completed to left anterior deltoid to relax tone and  increase ROM.                   OT Short Term Goals - 10/19/15 1623    OT SHORT TERM GOAL #1   Title Pt will be educated on HEP.    Time 4   Period Weeks   OT SHORT TERM GOAL #2   Title Pt will decrease pain in LUE to 5/10 or less to increase ability to complete daily tasks using LUE as assist.    Time 4   Period Weeks   OT SHORT TERM GOAL #3   Title Pt will decrease fascial restictions in the LUE to mod amounts or less to increase mobility during manual stretching.    Time 4   Period Weeks   OT SHORT TERM GOAL #4   Title Pt will increase P/ROM to [facility] to increase ability to use LUE as assist when donning shirts.    Time 4   Period Weeks   Status On-going   OT SHORT TERM GOAL #5   Title Pt will increase strength in LUE to 3+/5 to increase ability to use LUE as assist when preparing meals.    Time 4   Period Weeks   Status Partially Met           OT Long Term Goals - 10/19/15 1624    OT LONG TERM GOAL #1   Title Pt will return to prior level of functioning and independence in daily tasks.    Time 8   Period Weeks   Status On-going   OT LONG TERM GOAL #2   Title Pt will decrease pain in LUE to 2/10 or less to increase ability to complete functional tasks with LUE.    Time 8   Period Weeks   Status On-going   OT LONG TERM GOAL #3   Title Pt will decrease fascial restrictions in LUE to min amounts or less to increase mobility in the LUE during daily tasks.    Time 8   Period Weeks   Status On-going   OT LONG TERM GOAL #4   Title Pt will increase A/ROM to [facility] to increase ability to wash and roll her hair.    Time 8   Period Weeks   Status  On-going   OT LONG TERM GOAL #5   Title Pt will increase strength in LUE to 4/5 to increase ability to push her wheelbarrow when picking  up sticks in the yard.    Time 8   Period Weeks   Status On-going               Plan - 10/21/15 1509    Clinical Impression Statement A: Completed strengthening exercises supine and seated. Pt did well although complete abduction without weight to increase ability to perform proper form and technique.    Plan P: Continue to work on strengthening/AROM exercises to increase functional reaching ability needed to complete daily tasks.       Patient will benefit from skilled therapeutic intervention in order to improve the following deficits and impairments:  Decreased strength, Pain, Impaired UE functional use, Increased fascial restricitons, Decreased range of motion, Impaired flexibility  Visit Diagnosis: Stiffness of left shoulder, not elsewhere classified  Other symptoms and signs involving the musculoskeletal system    Problem List Patient Active Problem List   Diagnosis Date Noted  . At high risk for falls 07/05/2015  . Shoulder fracture, left 06/07/2015  . Closed fracture of orbital floor ([place]) 06/07/2015  . CKD (chronic kidney disease) stage 3, GFR 30-59 ml/min 05/24/2015  . Right kidney stone 02/22/2015  . Intertrochanteric fracture of left hip ([place]) 01/25/2014  . Hyperglycemia 03/25/2013  . MCI (mild cognitive impairment) 10/23/2012  . Esophageal dysphagia 08/31/2011  . Hyperlipemia 08/13/2007  . GERD 08/13/2007  . Osteoporosis 08/13/2007  . INTERMITTENT VERTIGO 08/13/2007    Ailene Ravel, OTR/L,CBIS  [phone]  10/21/2015, 3:21 PM  [place] [address] [place], [place], [address] Phone: [phone]   Fax:  [phone]  Name: Kristy Jarvis MRN: [id number] Date of Birth: [birth year]

## 2015-10-26 ENCOUNTER — Encounter (HOSPITAL_COMMUNITY): Payer: Self-pay | Admitting: Occupational Therapy

## 2015-10-26 ENCOUNTER — Ambulatory Visit (HOSPITAL_COMMUNITY): Payer: Medicare Other | Admitting: Occupational Therapy

## 2015-10-26 DIAGNOSIS — R29898 Other symptoms and signs involving the musculoskeletal system: Secondary | ICD-10-CM

## 2015-10-26 DIAGNOSIS — M25612 Stiffness of left shoulder, not elsewhere classified: Secondary | ICD-10-CM

## 2015-10-26 NOTE — Therapy (Signed)
[place], [place], [address] Phone: [phone]   Fax:  [phone]  Occupational Therapy Treatment  Patient Details  Name: Kristy Jarvis MRN: [id number] Date of Birth: [birth year] Referring Provider: Dr. Arther Abbott  Encounter Date: 10/26/2015      OT End of Session - 10/26/15 1607    Visit Number 14   Number of Visits 16   Date for OT Re-Evaluation 11/02/15   Authorization Type UHC Medicare   Authorization Time Period Before 18th visit   Authorization - Visit Number 14   Authorization - Number of Visits 18   OT Start Time 1432   OT Stop Time 1513   OT Time Calculation (min) 41 min   Activity Tolerance Patient tolerated treatment well   Behavior During Therapy [facility] for tasks assessed/performed      Past Medical History  Diagnosis Date  . Intermittent vertigo   . Renal colic   . Osteoporosis   . Hyperlipidemia   . GERD (gastroesophageal reflux disease)   . Diverticulosis of colon     by CT  . PONV (postoperative nausea and vomiting)   . Cataract 2013    right, no surgery at this time    Past Surgical History  Procedure Laterality Date  . Cataract extraction  2002    left eye   . Cholecystectomy  1989  . Abdominal hysterectomy    . Esophagogastroduodenoscopy  Jan  2007    prominent schatzki's ring, s/p 40F, small to moderate size hh  . Bunionectomy  06/27/2011    left , Dr Berline Lopes  . Eye surgery  2005 approx    cataract left  . Intramedullary (im) nail intertrochanteric Left 01/02/2014    Procedure: INTRAMEDULLARY (IM) NAIL INTERTROCHANTRIC;  Surgeon: Newt Minion, MD;  Location: [place];  Service: Orthopedics;  Laterality: Left;  . Hip surgery Left 12/20/2013    There were no vitals filed for this visit.      Subjective Assessment - 10/26/15 1432    Subjective  S: I don't believe this arm moves quite as well as the other one.    Currently in Pain? No/denies            [facility]  OT Assessment - 10/26/15 1432    Assessment   Diagnosis Left Shoulder Fracture   Precautions   Precautions None                  OT Treatments/Exercises (OP) - 10/26/15 1449    Exercises   Exercises Shoulder   Shoulder Exercises: Supine   Protraction PROM;5 reps;Strengthening;10 reps   Protraction Weight (lbs) 1   Horizontal ABduction PROM;5 reps;Strengthening;10 reps   Horizontal ABduction Weight (lbs) 1   External Rotation PROM;5 reps;Strengthening;10 reps   External Rotation Weight (lbs) 1   Internal Rotation PROM;5 reps;Strengthening;10 reps   Internal Rotation Weight (lbs) 1   Flexion PROM;5 reps;Strengthening;10 reps   Shoulder Flexion Weight (lbs) 1   ABduction PROM;5 reps;Strengthening;10 reps   Shoulder ABduction Weight (lbs) 1   Shoulder Exercises: Seated   Protraction Strengthening;10 reps   Protraction Weight (lbs) 1   Horizontal ABduction Strengthening;10 reps   Horizontal ABduction Weight (lbs) 1   External Rotation Strengthening;10 reps   External Rotation Weight (lbs) 1   Internal Rotation Strengthening;10 reps   Internal Rotation Weight (lbs) 1   Flexion Strengthening;10 reps   Flexion Weight (lbs) 1   Abduction Strengthening;10 reps   ABduction  Weight (lbs) 1   Shoulder Exercises: Standing   Other Standing Exercises PVC pipe slide; X15; LUE   Shoulder Exercises: ROM/Strengthening   Over Head Lace 2'   "W" Arms 10X   X to V Arms 10X   Proximal Shoulder Strengthening, Supine 12X each no rest breaks   Proximal Shoulder Strengthening, Seated 12X no rest breaks   Functional Reaching Activities   High Level Pt placed 10 cones onto middle shelf in peds cabinet. Pt with max difficulty reaching shelf, compensating by raising up onto tiptoes during placement.    Manual Therapy   Manual Therapy Myofascial release;Muscle Energy Technique   Manual therapy comments Manual therapy completed prior to therapeutic exercise this date   Myofascial Release  Myofascial release to left upper arm, trapezius, and scapularis regions to decrease pain and fascial restrictions and increase joint range of motion   Muscle Energy Technique Muscle energy technique completed to left anterior deltoid to relax tone and  increase ROM.                   OT Short Term Goals - 10/19/15 1623    OT SHORT TERM GOAL #1   Title Pt will be educated on HEP.    Time 4   Period Weeks   OT SHORT TERM GOAL #2   Title Pt will decrease pain in LUE to 5/10 or less to increase ability to complete daily tasks using LUE as assist.    Time 4   Period Weeks   OT SHORT TERM GOAL #3   Title Pt will decrease fascial restictions in the LUE to mod amounts or less to increase mobility during manual stretching.    Time 4   Period Weeks   OT SHORT TERM GOAL #4   Title Pt will increase P/ROM to [facility] to increase ability to use LUE as assist when donning shirts.    Time 4   Period Weeks   Status On-going   OT SHORT TERM GOAL #5   Title Pt will increase strength in LUE to 3+/5 to increase ability to use LUE as assist when preparing meals.    Time 4   Period Weeks   Status Partially Met           OT Long Term Goals - 10/19/15 1624    OT LONG TERM GOAL #1   Title Pt will return to prior level of functioning and independence in daily tasks.    Time 8   Period Weeks   Status On-going   OT LONG TERM GOAL #2   Title Pt will decrease pain in LUE to 2/10 or less to increase ability to complete functional tasks with LUE.    Time 8   Period Weeks   Status On-going   OT LONG TERM GOAL #3   Title Pt will decrease fascial restrictions in LUE to min amounts or less to increase mobility in the LUE during daily tasks.    Time 8   Period Weeks   Status On-going   OT LONG TERM GOAL #4   Title Pt will increase A/ROM to [facility] to increase ability to wash and roll her hair.    Time 8   Period Weeks   Status On-going   OT LONG TERM GOAL #5   Title Pt will increase strength in  LUE to 4/5 to increase ability to push her wheelbarrow when picking up sticks in the yard.    Time 8   Period  Weeks   Status On-going               Plan - 10/26/15 1607    Clinical Impression Statement A: Added 1# weight to abduction this session, as pt able to perform with proper form with verbal cuing. Completed functional reaching activity, max difficulty reaching middle shelf. Pt reports certain tasks, such as fixing her hair, are becoming easier to complete.    Rehab Potential Good   OT Frequency 2x / week   OT Duration 8 weeks   OT Treatment/Interventions Self-care/ADL training;Passive range of motion;Patient/family education;Cryotherapy;Moist Heat;Therapeutic exercise;Manual Therapy;Therapeutic activities   Plan P: Continue with functional reaching tasks, continue working on strengthening to improve ability to complete daily tasks. Resume missed exercises   Consulted and Agree with Plan of Care Patient      Patient will benefit from skilled therapeutic intervention in order to improve the following deficits and impairments:  Decreased strength, Pain, Impaired UE functional use, Increased fascial restricitons, Decreased range of motion, Impaired flexibility  Visit Diagnosis: Stiffness of left shoulder, not elsewhere classified  Other symptoms and signs involving the musculoskeletal system    Problem List Patient Active Problem List   Diagnosis Date Noted  . At high risk for falls 07/05/2015  . Shoulder fracture, left 06/07/2015  . Closed fracture of orbital floor ([place]) 06/07/2015  . CKD (chronic kidney disease) stage 3, GFR 30-59 ml/min 05/24/2015  . Right kidney stone 02/22/2015  . Intertrochanteric fracture of left hip ([place]) 01/25/2014  . Hyperglycemia 03/25/2013  . MCI (mild cognitive impairment) 10/23/2012  . Esophageal dysphagia 08/31/2011  . Hyperlipemia 08/13/2007  . GERD 08/13/2007  . Osteoporosis 08/13/2007  . INTERMITTENT VERTIGO 08/13/2007     Guadelupe Sabin, OTR/L  [phone]  10/26/2015, 4:10 PM  [place] [address] [place], [place], [address] Phone: [phone]   Fax:  [phone]  Name: Kristy Jarvis MRN: [id number] Date of Birth: Nov 27, 1920

## 2015-10-28 ENCOUNTER — Ambulatory Visit (HOSPITAL_COMMUNITY): Payer: Medicare Other | Admitting: Occupational Therapy

## 2015-10-28 ENCOUNTER — Encounter (HOSPITAL_COMMUNITY): Payer: Self-pay | Admitting: Occupational Therapy

## 2015-10-28 DIAGNOSIS — R29898 Other symptoms and signs involving the musculoskeletal system: Secondary | ICD-10-CM | POA: Diagnosis not present

## 2015-10-28 DIAGNOSIS — M25612 Stiffness of left shoulder, not elsewhere classified: Secondary | ICD-10-CM | POA: Diagnosis not present

## 2015-10-28 NOTE — Therapy (Signed)
[place], [place], [address] Phone: [phone]   Fax:  [phone]  Occupational Therapy Treatment  Patient Details  Name: Kristy Jarvis MRN: [id number] Date of Birth: September 18, 1920 Referring Provider: Dr. Arther Abbott  Encounter Date: 10/28/2015      OT End of Session - 10/28/15 1530    Visit Number 15   Number of Visits 16   Date for OT Re-Evaluation 11/02/15   Authorization Type [employer] Medicare   Authorization Time Period Before 18th visit   Authorization - Visit Number 15   Authorization - Number of Visits 18   OT Start Time 1431   OT Stop Time 1514   OT Time Calculation (min) 43 min   Activity Tolerance Patient tolerated treatment well   Behavior During Therapy [facility] for tasks assessed/performed      Past Medical History  Diagnosis Date  . Intermittent vertigo   . Renal colic   . Osteoporosis   . Hyperlipidemia   . GERD (gastroesophageal reflux disease)   . Diverticulosis of colon     by CT  . PONV (postoperative nausea and vomiting)   . Cataract 2013    right, no surgery at this time    Past Surgical History  Procedure Laterality Date  . Cataract extraction  2002    left eye   . Cholecystectomy  1989  . Abdominal hysterectomy    . Esophagogastroduodenoscopy  Jan  2007    prominent schatzki's ring, s/p 83F, small to moderate size hh  . Bunionectomy  06/27/2011    left , Dr Berline Lopes  . Eye surgery  2005 approx    cataract left  . Intramedullary (im) nail intertrochanteric Left 01/02/2014    Procedure: INTRAMEDULLARY (IM) NAIL INTERTROCHANTRIC;  Surgeon: Newt Minion, MD;  Location: [place];  Service: Orthopedics;  Laterality: Left;  . Hip surgery Left 12/20/2013    There were no vitals filed for this visit.      Subjective Assessment - 10/28/15 1433    Subjective  S: I got out and worked in the yard yesterday.    Currently in Pain? No/denies                      OT  Treatments/Exercises (OP) - 10/28/15 1433    Exercises   Exercises Shoulder   Shoulder Exercises: Supine   Protraction PROM;5 reps;Strengthening;10 reps   Protraction Weight (lbs) 1   Horizontal ABduction PROM;5 reps;Strengthening;10 reps   Horizontal ABduction Weight (lbs) 1   External Rotation PROM;5 reps;Strengthening;10 reps   External Rotation Weight (lbs) 1   Internal Rotation PROM;5 reps;Strengthening;10 reps   Internal Rotation Weight (lbs) 1   Flexion PROM;5 reps;Strengthening;10 reps   Shoulder Flexion Weight (lbs) 1   ABduction PROM;5 reps;Strengthening;10 reps   Shoulder ABduction Weight (lbs) 1   Shoulder Exercises: Seated   Protraction Strengthening;10 reps   Protraction Weight (lbs) 1   Horizontal ABduction Strengthening;10 reps   Horizontal ABduction Weight (lbs) 1   External Rotation Strengthening;10 reps   External Rotation Weight (lbs) 1   Internal Rotation Strengthening;10 reps   Internal Rotation Weight (lbs) 1   Flexion Strengthening;10 reps   Flexion Weight (lbs) 1   Abduction Strengthening;10 reps   ABduction Weight (lbs) 1   Shoulder Exercises: Standing   Other Standing Exercises PVC pipe slide; X15; LUE   Shoulder Exercises: ROM/Strengthening   UBE (Upper Arm Bike) Level 2 2' forward 2'  reverse   Over Head Lace 2' with 1# weight   X to V Arms 10X with 1# weight   Proximal Shoulder Strengthening, Supine 12X with 1# no rest breaks   Proximal Shoulder Strengthening, Seated 12X no rest breaks   Manual Therapy   Manual Therapy Myofascial release;Muscle Energy Technique   Manual therapy comments Manual therapy completed prior to therapeutic exercise this date   Myofascial Release Myofascial release to left upper arm, trapezius, and scapularis regions to decrease pain and fascial restrictions and increase joint range of motion   Muscle Energy Technique Muscle energy technique completed to left anterior deltoid to relax tone and  increase ROM.                    OT Short Term Goals - 10/19/15 1623    OT SHORT TERM GOAL #1   Title Pt will be educated on HEP.    Time 4   Period Weeks   OT SHORT TERM GOAL #2   Title Pt will decrease pain in LUE to 5/10 or less to increase ability to complete daily tasks using LUE as assist.    Time 4   Period Weeks   OT SHORT TERM GOAL #3   Title Pt will decrease fascial restictions in the LUE to mod amounts or less to increase mobility during manual stretching.    Time 4   Period Weeks   OT SHORT TERM GOAL #4   Title Pt will increase P/ROM to [facility] to increase ability to use LUE as assist when donning shirts.    Time 4   Period Weeks   Status On-going   OT SHORT TERM GOAL #5   Title Pt will increase strength in LUE to 3+/5 to increase ability to use LUE as assist when preparing meals.    Time 4   Period Weeks   Status Partially Met           OT Long Term Goals - 10/19/15 1624    OT LONG TERM GOAL #1   Title Pt will return to prior level of functioning and independence in daily tasks.    Time 8   Period Weeks   Status On-going   OT LONG TERM GOAL #2   Title Pt will decrease pain in LUE to 2/10 or less to increase ability to complete functional tasks with LUE.    Time 8   Period Weeks   Status On-going   OT LONG TERM GOAL #3   Title Pt will decrease fascial restrictions in LUE to min amounts or less to increase mobility in the LUE during daily tasks.    Time 8   Period Weeks   Status On-going   OT LONG TERM GOAL #4   Title Pt will increase A/ROM to [facility] to increase ability to wash and roll her hair.    Time 8   Period Weeks   Status On-going   OT LONG TERM GOAL #5   Title Pt will increase strength in LUE to 4/5 to increase ability to push her wheelbarrow when picking up sticks in the yard.    Time 8   Period Weeks   Status On-going               Plan - 10/28/15 1530    Clinical Impression Statement A: Resumed missed exercises, added 1# weight to x to v  arms and proximal shoulder strengthening in supine. Pt required verbal cuing for correct form  during exercises, verbal cuing on UBE for speed and direction.    Rehab Potential Good   OT Frequency 2x / week   OT Duration 8 weeks   OT Treatment/Interventions Self-care/ADL training;Passive range of motion;Patient/family education;Cryotherapy;Moist Heat;Therapeutic exercise;Manual Therapy;Therapeutic activities   Plan P: Reassessment & possible discharge   Consulted and Agree with Plan of Care Patient      Patient will benefit from skilled therapeutic intervention in order to improve the following deficits and impairments:  Decreased strength, Pain, Impaired UE functional use, Increased fascial restricitons, Decreased range of motion, Impaired flexibility  Visit Diagnosis: Stiffness of left shoulder, not elsewhere classified  Other symptoms and signs involving the musculoskeletal system    Problem List Patient Active Problem List   Diagnosis Date Noted  . At high risk for falls 07/05/2015  . Shoulder fracture, left 06/07/2015  . Closed fracture of orbital floor ([place]) 06/07/2015  . CKD (chronic kidney disease) stage 3, GFR 30-59 ml/min 05/24/2015  . Right kidney stone 02/22/2015  . Intertrochanteric fracture of left hip ([place]) 01/25/2014  . Hyperglycemia 03/25/2013  . MCI (mild cognitive impairment) 10/23/2012  . Esophageal dysphagia 08/31/2011  . Hyperlipemia 08/13/2007  . GERD 08/13/2007  . Osteoporosis 08/13/2007  . INTERMITTENT VERTIGO 08/13/2007    Guadelupe Sabin, OTR/L  [phone]  10/28/2015, 3:37 PM  [place] [address] [place], [place], [address] Phone: [phone]   Fax:  [phone]  Name: Kristy Jarvis MRN: [id number] Date of Birth: 1921-01-16

## 2015-11-02 ENCOUNTER — Ambulatory Visit (HOSPITAL_COMMUNITY): Payer: Medicare Other | Admitting: Occupational Therapy

## 2015-11-02 ENCOUNTER — Encounter (HOSPITAL_COMMUNITY): Payer: Self-pay | Admitting: Occupational Therapy

## 2015-11-02 DIAGNOSIS — M25612 Stiffness of left shoulder, not elsewhere classified: Secondary | ICD-10-CM

## 2015-11-02 DIAGNOSIS — R29898 Other symptoms and signs involving the musculoskeletal system: Secondary | ICD-10-CM | POA: Diagnosis not present

## 2015-11-02 NOTE — Therapy (Signed)
[place], [place], [address] Phone: [phone]   Fax:  [phone]  Occupational Therapy Reassessment, Treatment, Discharge Summary  Patient Details  Name: NAZ DENUNZIO MRN: [id number] Date of Birth: March 16, 1921 Referring Provider: Dr. Arther Abbott  Encounter Date: 11/02/2015      OT End of Session - 11/02/15 1610    Visit Number 16   Number of Visits 16   Date for OT Re-Evaluation 11/02/15   Authorization Type [employer] Medicare   Authorization Time Period Before 18th visit   Authorization - Visit Number 16   Authorization - Number of Visits 18   OT Start Time 1431   OT Stop Time 1514   OT Time Calculation (min) 43 min   Activity Tolerance Patient tolerated treatment well   Behavior During Therapy [facility] for tasks assessed/performed      Past Medical History  Diagnosis Date  . Intermittent vertigo   . Renal colic   . Osteoporosis   . Hyperlipidemia   . GERD (gastroesophageal reflux disease)   . Diverticulosis of colon     by CT  . PONV (postoperative nausea and vomiting)   . Cataract 2013    right, no surgery at this time    Past Surgical History  Procedure Laterality Date  . Cataract extraction  2002    left eye   . Cholecystectomy  1989  . Abdominal hysterectomy    . Esophagogastroduodenoscopy  Jan  2007    prominent schatzki's ring, s/p 40F, small to moderate size hh  . Bunionectomy  06/27/2011    left , Dr Berline Lopes  . Eye surgery  2005 approx    cataract left  . Intramedullary (im) nail intertrochanteric Left 01/02/2014    Procedure: INTRAMEDULLARY (IM) NAIL INTERTROCHANTRIC;  Surgeon: Newt Minion, MD;  Location: [place];  Service: Orthopedics;  Laterality: Left;  . Hip surgery Left 12/20/2013    There were no vitals filed for this visit.      Subjective Assessment - 11/02/15 1430    Subjective  S: I still need some help for those last rollers.    Currently in Pain? No/denies            [facility] OT Assessment - 11/02/15 1430    Assessment   Diagnosis Left Shoulder Fracture   Precautions   Precautions None   AROM   Overall AROM Comments Assessed seated, ER/IR adducted   AROM Assessment Site Shoulder   Right/Left Shoulder Left   Left Shoulder Flexion 95 Degrees  previous 90   Left Shoulder ABduction 94 Degrees  previous 84   Left Shoulder Internal Rotation 90 Degrees  same as previous   Left Shoulder External Rotation 51 Degrees  previous 67   PROM   Overall PROM Comments Assessed supine, ER/IR adducted   PROM Assessment Site Shoulder   Right/Left Shoulder Left   Left Shoulder Flexion 120 Degrees  previous 110   Left Shoulder ABduction 121 Degrees  previous 119   Left Shoulder Internal Rotation 90 Degrees  same as previous   Left Shoulder External Rotation 59 Degrees  55 previous   Strength   Overall Strength Comments Assessed seated, ER/IR adducted    Strength Assessment Site Shoulder   Right/Left Shoulder Left   Left Shoulder Flexion 3+/5  3-/5 previous   Left Shoulder ABduction 3+/5  3-/5 previous   Left Shoulder Internal Rotation 4/5  3+/5 previous   Left Shoulder External Rotation 4-/5  3+/5 previous                  OT Treatments/Exercises (OP) - 11/02/15 1433    Exercises   Exercises Shoulder   Shoulder Exercises: Supine   Protraction PROM;5 reps   Horizontal ABduction PROM;5 reps   External Rotation PROM;5 reps   Internal Rotation PROM;5 reps   Flexion PROM;5 reps   ABduction PROM;5 reps   Shoulder Exercises: Seated   Other Seated Exercises Red theraband strengthening exercises: horizontal abduction, adduction, ER/IR, PNF pattern exercises, 10X each   Shoulder Exercises: Standing   Other Standing Exercises PVC pipe slide; X15; LUE   Manual Therapy   Manual Therapy Myofascial release;Muscle Energy Technique   Manual therapy comments Manual therapy completed prior to therapeutic exercise this date   Myofascial  Release Myofascial release to left upper arm, trapezius, and scapularis regions to decrease pain and fascial restrictions and increase joint range of motion   Muscle Energy Technique Muscle energy technique completed to left anterior deltoid to relax tone and  increase ROM.                OT Education - 11/02/15 1610    Education provided Yes   Education Details red theraband strengthening exercises   Person(s) Educated Patient   Methods Explanation;Demonstration;Handout   Comprehension Verbalized understanding;Returned demonstration          OT Short Term Goals - 11/02/15 1456    OT SHORT TERM GOAL #1   Title Pt will be educated on HEP.    Time 4   Period Weeks   OT SHORT TERM GOAL #2   Title Pt will decrease pain in LUE to 5/10 or less to increase ability to complete daily tasks using LUE as assist.    Time 4   Period Weeks   OT SHORT TERM GOAL #3   Title Pt will decrease fascial restictions in the LUE to mod amounts or less to increase mobility during manual stretching.    Time 4   Period Weeks   OT SHORT TERM GOAL #4   Title Pt will increase P/ROM to [facility] to increase ability to use LUE as assist when donning shirts.    Time 4   Period Weeks   Status Partially Met   OT SHORT TERM GOAL #5   Title Pt will increase strength in LUE to 3+/5 to increase ability to use LUE as assist when preparing meals.    Time 4   Period Weeks   Status Achieved           OT Long Term Goals - 11/02/15 1457    OT LONG TERM GOAL #1   Title Pt will return to prior level of functioning and independence in daily tasks.    Time 8   Period Weeks   Status Achieved   OT LONG TERM GOAL #2   Title Pt will decrease pain in LUE to 2/10 or less to increase ability to complete functional tasks with LUE.    Time 8   Period Weeks   Status Achieved   OT LONG TERM GOAL #3   Title Pt will decrease fascial restrictions in LUE to min amounts or less to increase mobility in the LUE during daily  tasks.    Time 8   Period Weeks   Status Achieved   OT LONG TERM GOAL #4   Title Pt will increase A/ROM to [facility] to increase ability to wash and roll her hair.  Time 8   Period Weeks   Status Not Met   OT LONG TERM GOAL #5   Title Pt will increase strength in LUE to 4/5 to increase ability to push her wheelbarrow when picking up sticks in the yard.    Time 8   Period Weeks   Status Partially Met               Plan - 04-Nov-2015 1610    Clinical Impression Statement A: Reassessment completed this session, pt has met 4/5 STGs, 3/5 LTGs, and partially met 1/5 STG and 1/5 LTGs. Pt has made improvements in pain, range of motion, and strength during therapy sessions, reporting she is able to complete daily tasks independently. Pt provided with red theraband strengthening exercises for HEP, and is agreeable to discharge.    Rehab Potential Good   OT Frequency 2x / week   OT Duration 8 weeks   OT Treatment/Interventions Self-care/ADL training;Passive range of motion;Patient/family education;Cryotherapy;Moist Heat;Therapeutic exercise;Manual Therapy;Therapeutic activities   Plan P: Discharge pt.    OT Home Exercise Plan red theraband strengthening exercises   Consulted and Agree with Plan of Care Patient      Patient will benefit from skilled therapeutic intervention in order to improve the following deficits and impairments:  Decreased strength, Pain, Impaired UE functional use, Increased fascial restricitons, Decreased range of motion, Impaired flexibility  Visit Diagnosis: Stiffness of left shoulder, not elsewhere classified  Other symptoms and signs involving the musculoskeletal system      G-Codes - 11/04/2015 1612    Functional Assessment Tool Used FOTO score: 62/100 (38% impaired)   Functional Limitation Carrying, moving and handling objects   Carrying, Moving and Handling Objects Goal Status (P1025) At least 20 percent but less than 40 percent impaired, limited or restricted    Carrying, Moving and Handling Objects Discharge Status [id number]) At least 20 percent but less than 40 percent impaired, limited or restricted      Problem List Patient Active Problem List   Diagnosis Date Noted  . At high risk for falls 07/05/2015  . Shoulder fracture, left 06/07/2015  . Closed fracture of orbital floor ([place]) 06/07/2015  . CKD (chronic kidney disease) stage 3, GFR 30-59 ml/min 05/24/2015  . Right kidney stone 02/22/2015  . Intertrochanteric fracture of left hip ([place]) 01/25/2014  . Hyperglycemia 03/25/2013  . MCI (mild cognitive impairment) 10/23/2012  . Esophageal dysphagia 08/31/2011  . Hyperlipemia 08/13/2007  . GERD 08/13/2007  . Osteoporosis 08/13/2007  . INTERMITTENT VERTIGO 08/13/2007    Guadelupe Sabin, OTR/L  [phone]  2015/11/04, 4:14 PM  [place], [place], [address] Phone: [phone]   Fax:  [phone]  Name: EVERLYN FARABAUGH MRN: [id number] Date of Birth: [birth year]    OCCUPATIONAL THERAPY DISCHARGE SUMMARY  Visits from Start of Care: 16  Current functional level related to goals / functional outcomes: See above. Pt reports she is completing all ADL and household tasks independently.    Remaining deficits: Pt continues to have difficulty with overhead reaching, fixing her hair, and donning shirts without compensatory strategies.    Education / Equipment: Provided pt with red theraband strengthening exercises.   Plan: Patient agrees to discharge.  Patient goals were met. Patient is being discharged due to meeting the stated rehab goals.  ?????

## 2015-11-02 NOTE — Patient Instructions (Signed)
Strengthening: Chest Pull - Resisted   Hold Theraband in front of body with hands about shoulder width a part. Pull band a part and back together slowly. Repeat _10___ times. Complete __1__ set(s) per session.. Repeat __1-2__ session(s) per day.  http://orth.exer.us/926   Copyright  VHI. All rights reserved.   PNF Strengthening: Resisted   Standing with resistive band around each hand, bring right arm up and away, thumb back. Repeat _10___ times per set. Do ___1_ sets per session. Do __1-2__ sessions per day.                           Resisted External Rotation: in Neutral - Bilateral   Sit or stand, tubing in both hands, elbows at sides, bent to 90, forearms forward. Pinch shoulder blades together and rotate forearms out. Keep elbows at sides. Repeat _10___ times per set. Do __1__ sets per session. Do __1-2__ sessions per day.  http://orth.exer.us/966   Copyright  VHI. All rights reserved.   PNF Strengthening: Resisted   Standing, hold resistive band above head. Bring right arm down and out from side. Repeat _10___ times per set. Do __1__ sets per session. Do __1-2__ sessions per day.  http://orth.exer.us/922   Copyright  VHI. All rights reserved.   

## 2015-11-04 ENCOUNTER — Encounter (HOSPITAL_COMMUNITY): Payer: Medicare Other | Admitting: Occupational Therapy

## 2015-11-08 ENCOUNTER — Ambulatory Visit (INDEPENDENT_AMBULATORY_CARE_PROVIDER_SITE_OTHER): Payer: Medicare Other | Admitting: Orthopedic Surgery

## 2015-11-08 VITALS — BP 128/67 | HR 81 | Ht 60.0 in | Wt 100.6 lb

## 2015-11-08 DIAGNOSIS — S4292XD Fracture of left shoulder girdle, part unspecified, subsequent encounter for fracture with routine healing: Secondary | ICD-10-CM | POA: Diagnosis not present

## 2015-11-08 NOTE — Progress Notes (Signed)
Chief Complaint  Patient presents with  . Follow-up    Left proximal humerus fracture DOI 06/04/15    Follow-up after left proximal humerus fracture treated with sling-and-swathe and then physical therapy  The patient has mild discomfort in the left shoulder and stiffness  System review neurologically no tingling  Exam Physical Exam  Constitutional: She is oriented to person, place, and time. She appears well-developed and well-nourished. No distress.  Cardiovascular: Intact distal pulses. Right upper extremity and left upper extremity   Neurological: She is alert and oriented to person, place, and time. She exhibits normal muscle tone. Coordination normal.  Skin: Skin is warm and dry. No rash noted. She is not diaphoretic. No erythema. No pallor.  Psychiatric: She has a normal mood and affect. Her behavior is normal. Judgment and thought content normal.  Right shoulder flexion 80 external rotation 30 and abduction 100 passive range of motion  No tenderness to the proximal humerus and no gross deformities seen externally  Recommend release

## 2015-11-24 ENCOUNTER — Encounter: Payer: Self-pay | Admitting: Family Medicine

## 2015-11-24 ENCOUNTER — Ambulatory Visit (INDEPENDENT_AMBULATORY_CARE_PROVIDER_SITE_OTHER): Payer: Medicare Other | Admitting: Family Medicine

## 2015-11-24 ENCOUNTER — Emergency Department (HOSPITAL_COMMUNITY)
Admission: EM | Admit: 2015-11-24 | Discharge: 2015-11-24 | Disposition: A | Discharge status: Home / Self Care | Payer: Medicare Other | Attending: Emergency Medicine | Admitting: Emergency Medicine

## 2015-11-24 ENCOUNTER — Encounter (HOSPITAL_COMMUNITY): Payer: Self-pay | Admitting: Emergency Medicine

## 2015-11-24 ENCOUNTER — Emergency Department (HOSPITAL_COMMUNITY): Payer: Medicare Other

## 2015-11-24 VITALS — BP 140/72 | HR 80 | Temp 98.0°F | Resp 18 | Ht 60.0 in | Wt 100.0 lb

## 2015-11-24 DIAGNOSIS — R42 Dizziness and giddiness: Secondary | ICD-10-CM | POA: Diagnosis not present

## 2015-11-24 DIAGNOSIS — N201 Calculus of ureter: Secondary | ICD-10-CM | POA: Insufficient documentation

## 2015-11-24 DIAGNOSIS — Z79899 Other long term (current) drug therapy: Secondary | ICD-10-CM | POA: Diagnosis not present

## 2015-11-24 DIAGNOSIS — R1012 Left upper quadrant pain: Secondary | ICD-10-CM

## 2015-11-24 DIAGNOSIS — N132 Hydronephrosis with renal and ureteral calculous obstruction: Secondary | ICD-10-CM | POA: Diagnosis not present

## 2015-11-24 DIAGNOSIS — R112 Nausea with vomiting, unspecified: Secondary | ICD-10-CM

## 2015-11-24 DIAGNOSIS — R1032 Left lower quadrant pain: Secondary | ICD-10-CM | POA: Diagnosis present

## 2015-11-24 DIAGNOSIS — E785 Hyperlipidemia, unspecified: Secondary | ICD-10-CM | POA: Diagnosis not present

## 2015-11-24 LAB — CBC
HEMATOCRIT: 47.3 % — AB (ref 36.0–46.0)
HEMOGLOBIN: 16 g/dL — AB (ref 12.0–15.0)
MCH: 30.6 pg (ref 26.0–34.0)
MCHC: 33.8 g/dL (ref 30.0–36.0)
MCV: 90.4 fL (ref 78.0–100.0)
Platelets: 236 10*3/uL (ref 150–400)
RBC: 5.23 MIL/uL — ABNORMAL HIGH (ref 3.87–5.11)
RDW: 14.2 % (ref 11.5–15.5)
WBC: 10.2 10*3/uL (ref 4.0–10.5)

## 2015-11-24 LAB — COMPREHENSIVE METABOLIC PANEL
ALK PHOS: 73 U/L (ref 38–126)
ALT: 13 U/L — ABNORMAL LOW (ref 14–54)
ANION GAP: 11 (ref 5–15)
AST: 28 U/L (ref 15–41)
Albumin: 4.5 g/dL (ref 3.5–5.0)
BILIRUBIN TOTAL: 1 mg/dL (ref 0.3–1.2)
BUN: 21 mg/dL — ABNORMAL HIGH (ref 6–20)
CALCIUM: 10 mg/dL (ref 8.9–10.3)
CO2: 25 mmol/L (ref 22–32)
Chloride: 105 mmol/L (ref 101–111)
Creatinine, Ser: 1.09 mg/dL — ABNORMAL HIGH (ref 0.44–1.00)
GFR, EST AFRICAN AMERICAN: 49 mL/min — AB (ref >60)
GFR, EST NON AFRICAN AMERICAN: 43 mL/min — AB (ref >60)
GLUCOSE: 146 mg/dL — AB (ref 65–99)
POTASSIUM: 4 mmol/L (ref 3.5–5.1)
Sodium: 141 mmol/L (ref 135–145)
TOTAL PROTEIN: 7.8 g/dL (ref 6.5–8.1)

## 2015-11-24 LAB — URINALYSIS, ROUTINE W REFLEX MICROSCOPIC
BILIRUBIN URINE: NEGATIVE
Glucose, UA: NEGATIVE mg/dL
KETONES UR: 15 mg/dL — AB
LEUKOCYTES UA: NEGATIVE
NITRITE: NEGATIVE
PH: 7 (ref 5.0–8.0)
SPECIFIC GRAVITY, URINE: 1.01 (ref 1.005–1.030)

## 2015-11-24 LAB — URINE MICROSCOPIC-ADD ON

## 2015-11-24 LAB — LIPASE, BLOOD: Lipase: 27 U/L (ref 11–51)

## 2015-11-24 MED ORDER — FENTANYL CITRATE (PF) 100 MCG/2ML IJ SOLN
25.0000 ug | Freq: Once | INTRAMUSCULAR | Status: AC
  Administered 2015-11-24: 25 ug via INTRAVENOUS
  Filled 2015-11-24: qty 2

## 2015-11-24 MED ORDER — IOPAMIDOL (ISOVUE-300) INJECTION 61%
75.0000 mL | Freq: Once | INTRAVENOUS | Status: AC | PRN
  Administered 2015-11-24: 75 mL via INTRAVENOUS

## 2015-11-24 MED ORDER — HYDROCODONE-ACETAMINOPHEN 5-325 MG PO TABS: 2.0000 | ORAL_TABLET | ORAL | Status: DC | PRN

## 2015-11-24 MED ORDER — DIATRIZOATE MEGLUMINE & SODIUM 66-10 % PO SOLN
ORAL | Status: AC
  Filled 2015-11-24: qty 30

## 2015-11-24 MED ORDER — ONDANSETRON HCL 4 MG/2ML IJ SOLN
4.0000 mg | Freq: Once | INTRAMUSCULAR | Status: AC
  Administered 2015-11-24: 4 mg via INTRAMUSCULAR

## 2015-11-24 NOTE — Assessment & Plan Note (Signed)
1 day h/o acute onset LUQ pain, not noted to be radiating,constant and  rated at a 10, associated with nausea. Pt though she has voided is unable to describe urine . She has known kidney stone history. Guarding and tenderness on exam. Needs ED evaluation as she does have an acute abdomen on exam which is limited by her pain and overall debility. Zofran given for nausea

## 2015-11-24 NOTE — Assessment & Plan Note (Addendum)
3 ,month h/o increased frequency of light headedness and vertigo, almost daily, refer to ENT for evaluation, may also need cardiology eval will follow up on this

## 2015-11-24 NOTE — ED Provider Notes (Signed)
CSN: [id number]     Arrival date & time 11/24/15  1130 History  By signing my name below, I, Rayna Sexton, attest that this documentation has been prepared under the direction and in the presence of Davonna Belling, MD. Electronically Signed: Rayna Sexton, ED Scribe. 11/24/2015. 12:43 PM.   Chief Complaint  Patient presents with  . Emesis  . Abdominal Pain   The history is provided by the patient and a relative. No language interpreter was used.   HPI Comments: Kristy Jarvis is a 82 y.o. female who presents to the Emergency Department complaining of constant, moderate, LLQ pain x 7 hours. She reports associated n/v and decreased appetite. Her relative gave her 3x Advil this morning and she was given Zofran by her PCP and denies significant relief. She has a SHx including hysterectomy and cholecystectomy. Pt denies having a BM this morning and states she has been passing some gas today. She denies diarrhea and constipation.   Past Medical History  Diagnosis Date  . Intermittent vertigo   . Renal colic   . Osteoporosis   . Hyperlipidemia   . GERD (gastroesophageal reflux disease)   . Diverticulosis of colon     by CT  . PONV (postoperative nausea and vomiting)   . Cataract 2013    right, no surgery at this time   Past Surgical History  Procedure Laterality Date  . Cataract extraction  2002    left eye   . Cholecystectomy  1989  . Abdominal hysterectomy    . Esophagogastroduodenoscopy  Jan  2007    prominent schatzki's ring, s/p 68F, small to moderate size hh  . Bunionectomy  06/27/2011    left , Dr Berline Lopes  . Eye surgery  2005 approx    cataract left  . Intramedullary (im) nail intertrochanteric Left 01/02/2014    Procedure: INTRAMEDULLARY (IM) NAIL INTERTROCHANTRIC;  Surgeon: Newt Minion, MD;  Location: [facility];  Service: Orthopedics;  Laterality: Left;  . Hip surgery Left 12/20/2013   Family History  Problem Relation Age of Onset  . Heart attack Father   . Liver  cancer      family history   . Colon cancer Neg Hx   . Anesthesia problems Neg Hx   . Hypotension Neg Hx   . Malignant hyperthermia Neg Hx   . Pseudochol deficiency Neg Hx   . Diabetes Sister    Social History  Substance Use Topics  . Smoking status: Never Smoker   . Smokeless tobacco: None  . Alcohol Use: No   OB History    No data available     Review of Systems  Constitutional: Positive for appetite change.  Gastrointestinal: Positive for nausea, vomiting and abdominal pain. Negative for diarrhea and constipation.  All other systems reviewed and are negative.   Allergies  Alendronate sodium and Penicillins  Home Medications   Prior to Admission medications   Medication Sig Start Date End Date Taking? Authorizing Provider  calcium carbonate (TUMS EX) 750 MG chewable tablet Chew 2 tablets by mouth daily.    Yes Historical Provider, MD  Cholecalciferol (VITAMIN D) 400 UNITS capsule Take 400 Units by mouth daily.    Yes Historical Provider, MD  ibuprofen (ADVIL,MOTRIN) 200 MG tablet Take 600 mg by mouth once.   Yes Historical Provider, MD  lovastatin (MEVACOR) 40 MG tablet TAKE (1) TABLET BY MOUTH AT BEDTIME. 10/20/15  Yes Fayrene Helper, MD  omeprazole (PRILOSEC) 20 MG capsule TAKE (1) CAPSULE BY  MOUTH AT BEDTIME. 10/07/15  Yes Fayrene Helper, MD  ranitidine (ZANTAC) 150 MG tablet TAKE (1) TABLET BY MOUTH TWICE DAILY. 10/01/15  Yes Fayrene Helper, MD  Vitamin D, Ergocalciferol, (DRISDOL) 50000 units CAPS capsule TAKE 1 CAPSULE BY MOUTH ONCE A WEEK. 10/21/15  Yes Fayrene Helper, MD  HYDROcodone-acetaminophen (NORCO/VICODIN) 5-325 MG tablet Take 2 tablets by mouth every 4 (four) hours as needed. 11/24/15   Davonna Belling, MD   BP 156/62 mmHg  Pulse 62  Temp(Src) 98 F (36.7 C) (Oral)  Resp 14  Ht 5' (1.524 m)  Wt 95 lb (43.092 kg)  BMI 18.55 kg/m2  SpO2 98%    Physical Exam  Constitutional: She is oriented to person, place, and time. She appears  well-developed.  HENT:  Head: Normocephalic and atraumatic.  Eyes: EOM are normal.  Neck: Normal range of motion.  Cardiovascular: Normal rate.   Pulmonary/Chest: Effort normal and breath sounds normal. No respiratory distress. She has no wheezes. She has no rales.  Abdominal: Soft. She exhibits distension. There is tenderness. There is CVA tenderness.  Suprapubic to left sided abd tenderness with mild distension. Mild left sided CVA tenderness.   Musculoskeletal: Normal range of motion.  Neurological: She is alert and oriented to person, place, and time.  Skin: Skin is warm and dry.  Psychiatric: She has a normal mood and affect.  Nursing note and vitals reviewed.   ED Course  Procedures  DIAGNOSTIC STUDIES: Oxygen Saturation is 100% on RA, normal by my interpretation.    COORDINATION OF CARE: 12:39 PM Discussed next steps with pt. Pt verbalized understanding and is agreeable with the plan.   Labs Review Labs Reviewed  COMPREHENSIVE METABOLIC PANEL - Abnormal; Notable for the following:    Glucose, Bld 146 (*)    BUN 21 (*)    Creatinine, Ser 1.09 (*)    ALT 13 (*)    GFR calc non Af Amer 43 (*)    GFR calc Af Amer 49 (*)    All other components within normal limits  CBC - Abnormal; Notable for the following:    RBC 5.23 (*)    Hemoglobin 16.0 (*)    HCT 47.3 (*)    All other components within normal limits  URINALYSIS, ROUTINE W REFLEX MICROSCOPIC (NOT AT [facility]) - Abnormal; Notable for the following:    Hgb urine dipstick SMALL (*)    Ketones, ur 15 (*)    Protein, ur TRACE (*)    All other components within normal limits  URINE MICROSCOPIC-ADD ON - Abnormal; Notable for the following:    Squamous Epithelial / LPF 0-5 (*)    Bacteria, UA MANY (*)    All other components within normal limits  LIPASE, BLOOD    Imaging Review Ct Abdomen Pelvis W Contrast  11/24/2015  CLINICAL DATA:  Left-sided abdominal pain with nausea and vomiting since early this morning. EXAM: CT  ABDOMEN AND PELVIS WITH CONTRAST TECHNIQUE: Multidetector CT imaging of the abdomen and pelvis was performed using the standard protocol following bolus administration of intravenous contrast. CONTRAST:  42mL ISOVUE-300 IOPAMIDOL (ISOVUE-300) INJECTION 61% COMPARISON:  02/22/2015 FINDINGS: Lower chest:  Large hiatal hernia again demonstrated. Hepatobiliary: No masses or other significant abnormality. Prior cholecystectomy noted. No evidence of biliary dilatation. Pancreas: No mass, inflammatory changes, or other significant abnormality. Spleen: Within normal limits in size and appearance. Adrenals/Urinary Tract: Normal adrenal glands. New mild left hydronephrosis is seen due to 3 mm proximal left ureteral calculus.  Small less than 1 cm calculi again seen lower pole of right kidney however there is no evidence of right-sided hydronephrosis or ureteral calculi. No bladder calculi visualized. Stomach/Bowel: No evidence of obstruction, inflammatory process, or abnormal fluid collections. Sigmoid diverticulosis again demonstrated, however there is no evidence of diverticulitis. Vascular/Lymphatic: No pathologically enlarged lymph nodes. No evidence of abdominal aortic aneurysm. Aortic atherosclerosis noted. Reproductive: Prior hysterectomy noted. Adnexal regions are unremarkable in appearance. Other: None. Musculoskeletal: No suspicious bone lesions identified. Internal fixation hardware again seen within the left hip. Advanced degenerative lumbar spondylosis and scoliosis noted. IMPRESSION: Mild left hydronephrosis due to 3 mm proximal left ureteral calculus. Nonobstructive right renal calculi. Stable large hiatal hernia. Colonic diverticulosis. No radiographic evidence of diverticulitis. Aortic atherosclerosis noted. Electronically Signed   By: Earle Gell M.D.   On: 11/24/2015 16:30   I have personally reviewed and evaluated these images and lab results as part of my medical decision-making.   EKG  Interpretation None      MDM   Final diagnoses:  Left ureteral stone    Patient with emesis and abdominal pain. Found to have ureteral stone. History of same. Feels better after treatment. No infection. Will discharge home to follow-up with urology. I personally performed the services described in this documentation, which was scribed in my presence. The recorded information has been reviewed and is accurate.      Davonna Belling, MD 11/24/15 2154

## 2015-11-24 NOTE — Progress Notes (Signed)
   JASANI AKRIDGE     MRN: [id number]      DOB: 1920-08-30   HPI Ms. Barbarito is herewith a 1 day h/o acute left upper abdominal pain and nausea. Was in her ususal state of health the day before. Unable to state whether urine looks bloody. Does have kidney stones. Denies fever or chills or burning with urine. 3 month h/o nearly daily vertigo, not quite like prior episodes of vertigo but c/o light headedness/ imbalance  regularly  ROS Denies recent fever or chills. Denies sinus pressure, nasal congestion, ear pain or sore throat. Denies chest congestion, productive cough or wheezing. Denies chest pains, palpitations and leg swelling  Denies uncontrolled  joint pain, swelling and limitation in mobility. Denies headaches, seizures, numbness, or tingling. Denies depression, anxiety or insomnia. Denies skin break down or rash.   PE  BP 140/72 mmHg  Pulse 80  Temp(Src) 98 F (36.7 C)  Resp 18  Ht 5' (1.524 m)  Wt 100 lb (45.36 kg)  BMI 19.53 kg/m2  SpO2 98%  Patient alert , oriented, ill appearing , in pain, bending over holding upper abdomen and in no cardiopulmonary distress.  HEENT: No facial asymmetry, EOMI,   oropharynx pink and moist.  Neck adequate ROM though reduced  no JVD, no mass.  Chest: Clear to auscultation bilaterally.  CVS: S1, S2 no murmurs, no S3.Regular rate.  ABD: Soft, tender in LUQ with guarding, bowel sounds normal.   Ext: No edema  MS: Decreased  ROM spine, shoulders, hips and knees.  Skin: Intact, no ulcerations or rash noted.  Psych: Good eye contact, normal affect. Memory intact not anxious or depressed appearing.  CNS: CN 2-12 intact, power,  normal throughout.no focal deficits noted.No nystragmus   Assessment & Plan  Acute abdominal pain in left upper quadrant 1 day h/o acute onset LUQ pain, not noted to be radiating,constant and  rated at a 10, associated with nausea. Pt though she has voided is unable to describe urine . She has  known kidney stone history. Guarding and tenderness on exam. Needs ED evaluation as she does have an acute abdomen on exam which is limited by her pain and overall debility. Zofran given for nausea  Nausea and vomiting Acute onset nausea and vomit with onset of acute severe abdominal pain. Zofran administered IM in the office  Differential for acute presentation is renal colic or pancreatitis, needs ED eval. Direct contact made with ED Physician and pt taken to the Ed by her daughter , who accompanied her to the visit  Vertigo 3 ,month h/o increased frequency of light headedness and vertigo, almost daily, refer to ENT for evaluation, may also need cardiology eval will follow up on this

## 2015-11-24 NOTE — Patient Instructions (Signed)
Keep appointment August 1 as before  You  Need to go to the ED for furhter assesment of your abdominal pain , I have spoken to the ED Doc  Zofran 4 mg IM administered in the office for nausea  You are referred to ENT for 2 month h/o vertigo  Hope you feel better soon, I will check on you

## 2015-11-24 NOTE — ED Notes (Signed)
Patient complaining of vomiting and left sided lower abdominal pain since 0530 this morning. States she went to Dr [employer] office and was given "I think it was zofran" then sent to ER.

## 2015-11-24 NOTE — Assessment & Plan Note (Signed)
Acute onset nausea and vomit with onset of acute severe abdominal pain. Zofran administered IM in the office  Differential for acute presentation is renal colic or pancreatitis, needs ED eval. Direct contact made with ED Physician and pt taken to the Ed by her daughter , who accompanied her to the visit

## 2015-11-24 NOTE — Discharge Instructions (Signed)
Kidney Stones °Kidney stones (urolithiasis) are deposits that form inside your kidneys. The intense pain is caused by the stone moving through the urinary tract. When the stone moves, the ureter goes into spasm around the stone. The stone is usually passed in the urine.  °CAUSES  °· A disorder that makes certain neck glands produce too much parathyroid hormone (primary hyperparathyroidism). °· A buildup of uric acid crystals, similar to gout in your joints. °· Narrowing (stricture) of the ureter. °· A kidney obstruction present at birth (congenital obstruction). °· Previous surgery on the kidney or ureters. °· Numerous kidney infections. °SYMPTOMS  °· Feeling sick to your stomach (nauseous). °· Throwing up (vomiting). °· Blood in the urine (hematuria). °· Pain that usually spreads (radiates) to the groin. °· Frequency or urgency of urination. °DIAGNOSIS  °· Taking a history and physical exam. °· Blood or urine tests. °· CT scan. °· Occasionally, an examination of the inside of the urinary bladder (cystoscopy) is performed. °TREATMENT  °· Observation. °· Increasing your fluid intake. °· Extracorporeal shock wave lithotripsy--This is a noninvasive procedure that uses shock waves to break up kidney stones. °· Surgery may be needed if you have severe pain or persistent obstruction. There are various surgical procedures. Most of the procedures are performed with the use of small instruments. Only small incisions are needed to accommodate these instruments, so recovery time is minimized. °The size, location, and chemical composition are all important variables that will determine the proper choice of action for you. Talk to your health care provider to better understand your situation so that you will minimize the risk of injury to yourself and your kidney.  °HOME CARE INSTRUCTIONS  °· Drink enough water and fluids to keep your urine clear or pale yellow. This will help you to pass the stone or stone fragments. °· Strain  all urine through the provided strainer. Keep all particulate matter and stones for your health care provider to see. The stone causing the pain may be as small as a grain of salt. It is very important to use the strainer each and every time you pass your urine. The collection of your stone will allow your health care provider to analyze it and verify that a stone has actually passed. The stone analysis will often identify what you can do to reduce the incidence of recurrences. °· Only take over-the-counter or prescription medicines for pain, discomfort, or fever as directed by your health care provider. °· Keep all follow-up visits as told by your health care provider. This is important. °· Get follow-up X-rays if required. The absence of pain does not always mean that the stone has passed. It may have only stopped moving. If the urine remains completely obstructed, it can cause loss of kidney function or even complete destruction of the kidney. It is your responsibility to make sure X-rays and follow-ups are completed. Ultrasounds of the kidney can show blockages and the status of the kidney. Ultrasounds are not associated with any radiation and can be performed easily in a matter of minutes. °· Make changes to your daily diet as told by your health care provider. You may be told to: °¨ Limit the amount of salt that you eat. °¨ Eat 5 or more servings of fruits and vegetables each day. °¨ Limit the amount of meat, poultry, fish, and eggs that you eat. °· Collect a 24-hour urine sample as told by your health care provider. You may need to collect another urine sample every 6-12   months. °SEEK MEDICAL CARE IF: °· You experience pain that is progressive and unresponsive to any pain medicine you have been prescribed. °SEEK IMMEDIATE MEDICAL CARE IF:  °· Pain cannot be controlled with the prescribed medicine. °· You have a fever or shaking chills. °· The severity or intensity of pain increases over 18 hours and is not  relieved by pain medicine. °· You develop a new onset of abdominal pain. °· You feel faint or pass out. °· You are unable to urinate. °  °This information is not intended to replace advice given to you by your health care provider. Make sure you discuss any questions you have with your health care provider. °  °Document Released: 05/08/2005 Document Revised: 01/27/2015 Document Reviewed: 10/09/2012 °Elsevier Interactive Patient Education ©2016 [employer] ° °

## 2015-12-09 DIAGNOSIS — I739 Peripheral vascular disease, unspecified: Secondary | ICD-10-CM | POA: Diagnosis not present

## 2015-12-09 DIAGNOSIS — B351 Tinea unguium: Secondary | ICD-10-CM | POA: Diagnosis not present

## 2015-12-15 ENCOUNTER — Other Ambulatory Visit: Payer: Self-pay | Admitting: Urology

## 2015-12-15 ENCOUNTER — Ambulatory Visit (INDEPENDENT_AMBULATORY_CARE_PROVIDER_SITE_OTHER): Payer: Medicare Other | Admitting: Urology

## 2015-12-15 DIAGNOSIS — N201 Calculus of ureter: Secondary | ICD-10-CM

## 2015-12-16 ENCOUNTER — Ambulatory Visit (HOSPITAL_COMMUNITY): Payer: Medicare Other

## 2015-12-16 ENCOUNTER — Ambulatory Visit (HOSPITAL_COMMUNITY)
Admission: RE | Admit: 2015-12-16 | Discharge: 2015-12-16 | Disposition: A | Discharge status: Home / Self Care | Payer: Medicare Other | Source: Ambulatory Visit | Attending: Urology | Admitting: Urology

## 2015-12-16 DIAGNOSIS — N201 Calculus of ureter: Secondary | ICD-10-CM

## 2015-12-16 DIAGNOSIS — E785 Hyperlipidemia, unspecified: Secondary | ICD-10-CM | POA: Diagnosis not present

## 2015-12-16 DIAGNOSIS — R7309 Other abnormal glucose: Secondary | ICD-10-CM | POA: Diagnosis not present

## 2015-12-16 DIAGNOSIS — N200 Calculus of kidney: Secondary | ICD-10-CM | POA: Insufficient documentation

## 2015-12-16 DIAGNOSIS — R739 Hyperglycemia, unspecified: Secondary | ICD-10-CM | POA: Diagnosis not present

## 2015-12-16 DIAGNOSIS — E559 Vitamin D deficiency, unspecified: Secondary | ICD-10-CM | POA: Diagnosis not present

## 2015-12-16 LAB — CBC
HEMATOCRIT: 42.5 % (ref 35.0–45.0)
Hemoglobin: 14.3 g/dL (ref 11.7–15.5)
MCH: 29.8 pg (ref 27.0–33.0)
MCHC: 33.6 g/dL (ref 32.0–36.0)
MCV: 88.5 fL (ref 80.0–100.0)
MPV: 10.8 fL (ref 7.5–12.5)
Platelets: 240 10*3/uL (ref 140–400)
RBC: 4.8 MIL/uL (ref 3.80–5.10)
RDW: 14.4 % (ref 11.0–15.0)
WBC: 6 10*3/uL (ref 3.8–10.8)

## 2015-12-16 LAB — COMPREHENSIVE METABOLIC PANEL
ALBUMIN: 4.1 g/dL (ref 3.6–5.1)
ALK PHOS: 64 U/L (ref 33–130)
ALT: 10 U/L (ref 6–29)
AST: 20 U/L (ref 10–35)
BUN: 13 mg/dL (ref 7–25)
CHLORIDE: 106 mmol/L (ref 98–110)
CO2: 27 mmol/L (ref 20–31)
Calcium: 9.6 mg/dL (ref 8.6–10.4)
Creat: 1.03 mg/dL — ABNORMAL HIGH (ref 0.60–0.88)
Glucose, Bld: 98 mg/dL (ref 65–99)
POTASSIUM: 4 mmol/L (ref 3.5–5.3)
SODIUM: 142 mmol/L (ref 135–146)
Total Bilirubin: 0.7 mg/dL (ref 0.2–1.2)
Total Protein: 6.5 g/dL (ref 6.1–8.1)

## 2015-12-16 LAB — LIPID PANEL
CHOL/HDL RATIO: 2.3 ratio (ref <=5.0)
Cholesterol: 169 mg/dL (ref 125–200)
HDL: 74 mg/dL (ref >=46)
LDL CALC: 66 mg/dL (ref <130)
Triglycerides: 146 mg/dL (ref <150)
VLDL: 29 mg/dL (ref <30)

## 2015-12-16 LAB — TSH: TSH: 2.09 m[IU]/L

## 2015-12-17 LAB — VITAMIN D 25 HYDROXY (VIT D DEFICIENCY, FRACTURES): Vit D, 25-Hydroxy: 65 ng/mL (ref 30–100)

## 2015-12-21 ENCOUNTER — Encounter: Payer: Self-pay | Admitting: Family Medicine

## 2015-12-21 ENCOUNTER — Ambulatory Visit (INDEPENDENT_AMBULATORY_CARE_PROVIDER_SITE_OTHER): Payer: Medicare Other | Admitting: Family Medicine

## 2015-12-21 VITALS — BP 122/78 | HR 85 | Resp 16 | Ht 60.0 in | Wt 101.1 lb

## 2015-12-21 DIAGNOSIS — Z0000 Encounter for general adult medical examination without abnormal findings: Secondary | ICD-10-CM

## 2015-12-21 NOTE — Progress Notes (Signed)
Preventive Screening-Counseling & Management   Patient present here today for a Medicare annual wellness visit.   Current Problems (verified)   Medications Prior to Visit Allergies (verified)   PAST HISTORY  Family History- updated  Social History widowed, 1 child, lives alone, never smoked    Risk Factors  Current exercise habits:  Walks up and down her driveway and she will do leg lifts and squats at home   Dietary issues discussed: heart healthy diet, limits fried fatty foods and carbs   Cardiac risk factors:   Depression Screen  (Note: if answer to either of the following is "Yes", a more complete depression screening is indicated)   Over the past two weeks, have you felt down, depressed or hopeless? No  Over the past two weeks, have you felt little interest or pleasure in doing things? No  Have you lost interest or pleasure in daily life? No  Do you often feel hopeless? No  Do you cry easily over simple problems? No   Activities of Daily Living  In your present state of health, do you have any difficulty performing the following activities?  Driving?: hasn't drove in 6 months Managing money?: No Feeding yourself?:No Getting from bed to chair?:No Climbing a flight of stairs?:Not many  Preparing food and eating?:No Bathing or showering?:No Getting dressed?:No Getting to the toilet?:No Using the toilet?:No Moving around from place to place?: uses claw cane   Fall Risk Assessment In the past year have you fallen or had a near fall?:Once in January and broke her shoulder Are you currently taking any medications that make you dizziness?:No   Hearing Difficulties: No Do you often ask people to speak up or repeat themselves?: sometimes Do you experience ringing or noises in your ears?:No Do you have difficulty understanding soft or whispered voices?:sometimes  Cognitive Testing  Alert? Yes Normal Appearance?Yes  Oriented to person? Yes Place? Yes  Time? Yes   Displays appropriate judgment?Yes  Can read the correct time from a watch face? yes Are you having problems remembering things?No  Advanced Directives have been discussed with the patient?Yes, patient has living will     List the Names of Other Physician/Practitioners you currently use: Updated   Indicate any recent Medical Services you may have received from other than [facility] providers in the past year (date may be approximate).     Medicare Attestation  I have personally reviewed:  The patient's medical and social history  Their use of alcohol, tobacco or illicit drugs  Their current medications and supplements  The patient's functional ability including ADLs,fall risks, home safety risks, cognitive, and hearing and visual impairment  Diet and physical activities  Evidence for depression or mood disorders  The patient's weight, height, BMI, and visual acuity have been recorded in the chart. I have made referrals, counseling, and provided education to the patient based on review of the above and I have provided the patient with a written personalized care plan for preventive services.    Physical Exam BP 122/78 (BP Location: Left Arm, Patient Position: Sitting, Cuff Size: Normal)   Pulse 85   Resp 16   Ht 5' (1.524 m)   Wt 101 lb 1.9 oz (45.9 kg)   SpO2 96%   BMI 19.75 kg/m    Assessment & Plan:  Medicare annual wellness visit, subsequent Annual exam as documented. Counseling done  re healthy lifestyle involving commitment to 150 minutes exercise per week, heart healthy diet, and attaining healthy weight.The importance of  adequate sleep also discussed. Regular seat belt use and home safety, is also discussed. Changes in health habits are decided on by the patient with goals and time frames  set for achieving them. Immunization and cancer screening needs are specifically addressed at this visit.

## 2015-12-21 NOTE — Patient Instructions (Addendum)
F/u in early December, call if you need me before  Pls check the seniors group   Safe trip to [place]  Excellent labs  No med changes  Pls be careful not to fall, as you are doing  Thank you  for choosing [facility]. We consider it a privelige to serve you.  Delivering excellent health care in a caring and  compassionate way is our goal.  Partnering with you,  so that together we can achieve this goal is our strategy.

## 2015-12-25 ENCOUNTER — Encounter: Payer: Self-pay | Admitting: Family Medicine

## 2015-12-25 NOTE — Assessment & Plan Note (Signed)

## 2015-12-29 ENCOUNTER — Ambulatory Visit: Payer: Medicare Other | Admitting: Urology

## 2016-01-04 ENCOUNTER — Other Ambulatory Visit: Payer: Self-pay

## 2016-01-04 MED ORDER — LOVASTATIN 40 MG PO TABS: ORAL_TABLET | ORAL | 1 refills | Status: DC

## 2016-01-06 ENCOUNTER — Other Ambulatory Visit: Payer: Self-pay | Admitting: Family Medicine

## 2016-01-12 ENCOUNTER — Ambulatory Visit (INDEPENDENT_AMBULATORY_CARE_PROVIDER_SITE_OTHER): Payer: Medicare Other | Admitting: Urology

## 2016-01-12 DIAGNOSIS — N201 Calculus of ureter: Secondary | ICD-10-CM

## 2016-02-09 ENCOUNTER — Other Ambulatory Visit: Payer: Self-pay | Admitting: Urology

## 2016-02-09 DIAGNOSIS — N201 Calculus of ureter: Secondary | ICD-10-CM

## 2016-02-17 ENCOUNTER — Ambulatory Visit (INDEPENDENT_AMBULATORY_CARE_PROVIDER_SITE_OTHER): Payer: Medicare Other | Admitting: Otolaryngology

## 2016-02-17 DIAGNOSIS — R42 Dizziness and giddiness: Secondary | ICD-10-CM | POA: Diagnosis not present

## 2016-02-17 DIAGNOSIS — H6123 Impacted cerumen, bilateral: Secondary | ICD-10-CM | POA: Diagnosis not present

## 2016-02-17 DIAGNOSIS — H903 Sensorineural hearing loss, bilateral: Secondary | ICD-10-CM

## 2016-02-22 ENCOUNTER — Ambulatory Visit (HOSPITAL_COMMUNITY)
Admission: RE | Admit: 2016-02-22 | Discharge: 2016-02-22 | Disposition: A | Discharge status: Home / Self Care | Payer: Medicare Other | Source: Ambulatory Visit | Attending: Urology | Admitting: Urology

## 2016-02-22 DIAGNOSIS — N200 Calculus of kidney: Secondary | ICD-10-CM | POA: Insufficient documentation

## 2016-02-22 DIAGNOSIS — N201 Calculus of ureter: Secondary | ICD-10-CM

## 2016-02-22 DIAGNOSIS — N202 Calculus of kidney with calculus of ureter: Secondary | ICD-10-CM | POA: Diagnosis not present

## 2016-02-29 ENCOUNTER — Ambulatory Visit (HOSPITAL_COMMUNITY)
Admission: RE | Admit: 2016-02-29 | Discharge: 2016-02-29 | Disposition: A | Discharge status: Home / Self Care | Payer: Medicare Other | Source: Ambulatory Visit | Attending: Urology | Admitting: Urology

## 2016-02-29 DIAGNOSIS — N202 Calculus of kidney with calculus of ureter: Secondary | ICD-10-CM | POA: Diagnosis not present

## 2016-02-29 DIAGNOSIS — N201 Calculus of ureter: Secondary | ICD-10-CM | POA: Diagnosis present

## 2016-02-29 DIAGNOSIS — N200 Calculus of kidney: Secondary | ICD-10-CM | POA: Diagnosis not present

## 2016-03-08 ENCOUNTER — Other Ambulatory Visit: Payer: Self-pay | Admitting: Family Medicine

## 2016-03-15 ENCOUNTER — Ambulatory Visit (INDEPENDENT_AMBULATORY_CARE_PROVIDER_SITE_OTHER): Payer: Medicare Other | Admitting: Urology

## 2016-03-15 DIAGNOSIS — N200 Calculus of kidney: Secondary | ICD-10-CM | POA: Diagnosis not present

## 2016-03-21 ENCOUNTER — Other Ambulatory Visit: Payer: Self-pay | Admitting: Family Medicine

## 2016-04-10 ENCOUNTER — Other Ambulatory Visit: Payer: Self-pay | Admitting: Family Medicine

## 2016-04-10 ENCOUNTER — Telehealth: Payer: Self-pay | Admitting: Family Medicine

## 2016-04-10 NOTE — Telephone Encounter (Signed)
Kristy Jarvis is calling asking if we can refill her mothers medications for as long as they can be refilled, she is having to call everytime one runs out because they have no refills on them

## 2016-04-11 ENCOUNTER — Other Ambulatory Visit: Payer: Self-pay

## 2016-04-11 MED ORDER — VITAMIN D (ERGOCALCIFEROL) 1.25 MG (50000 UNIT) PO CAPS: 50000.0000 [IU] | ORAL_CAPSULE | ORAL | 5 refills | Status: DC

## 2016-04-11 MED ORDER — LOVASTATIN 40 MG PO TABS: ORAL_TABLET | ORAL | 1 refills | Status: DC

## 2016-04-11 MED ORDER — OMEPRAZOLE 20 MG PO CPDR: DELAYED_RELEASE_CAPSULE | ORAL | 5 refills | Status: DC

## 2016-04-11 MED ORDER — RANITIDINE HCL 150 MG PO TABS: ORAL_TABLET | ORAL | 5 refills | Status: DC

## 2016-04-11 NOTE — Telephone Encounter (Signed)
meds refilled for 6 months

## 2016-04-12 DIAGNOSIS — B351 Tinea unguium: Secondary | ICD-10-CM | POA: Diagnosis not present

## 2016-04-12 DIAGNOSIS — I739 Peripheral vascular disease, unspecified: Secondary | ICD-10-CM | POA: Diagnosis not present

## 2016-04-25 ENCOUNTER — Ambulatory Visit: Payer: Medicare Other | Admitting: Family Medicine

## 2016-06-15 ENCOUNTER — Telehealth: Payer: Self-pay | Admitting: Family Medicine

## 2016-06-15 NOTE — Telephone Encounter (Signed)
Message left regarding need to schedule AWV.

## 2016-07-13 ENCOUNTER — Other Ambulatory Visit: Payer: Self-pay | Admitting: Family Medicine

## 2016-07-13 NOTE — Telephone Encounter (Signed)
Vit d level 65 on 7 [phone] 17   Do you want to reorder?

## 2016-07-14 NOTE — Telephone Encounter (Signed)
pls do not refill vit D

## 2016-07-17 ENCOUNTER — Ambulatory Visit: Payer: Medicare Other | Admitting: Family Medicine

## 2016-07-20 ENCOUNTER — Ambulatory Visit (INDEPENDENT_AMBULATORY_CARE_PROVIDER_SITE_OTHER): Payer: Medicare Other

## 2016-07-20 ENCOUNTER — Encounter: Payer: Self-pay | Admitting: Family Medicine

## 2016-07-20 ENCOUNTER — Ambulatory Visit (INDEPENDENT_AMBULATORY_CARE_PROVIDER_SITE_OTHER): Payer: Medicare Other | Admitting: Family Medicine

## 2016-07-20 VITALS — BP 128/82 | HR 75 | Temp 98.6°F | Ht 60.0 in | Wt 103.0 lb

## 2016-07-20 VITALS — BP 128/82 | HR 75 | Resp 15 | Ht 60.0 in | Wt 103.0 lb

## 2016-07-20 DIAGNOSIS — Z0000 Encounter for general adult medical examination without abnormal findings: Secondary | ICD-10-CM

## 2016-07-20 DIAGNOSIS — Z9181 History of falling: Secondary | ICD-10-CM | POA: Diagnosis not present

## 2016-07-20 DIAGNOSIS — M810 Age-related osteoporosis without current pathological fracture: Secondary | ICD-10-CM

## 2016-07-20 DIAGNOSIS — E785 Hyperlipidemia, unspecified: Secondary | ICD-10-CM

## 2016-07-20 DIAGNOSIS — E559 Vitamin D deficiency, unspecified: Secondary | ICD-10-CM | POA: Diagnosis not present

## 2016-07-20 DIAGNOSIS — K219 Gastro-esophageal reflux disease without esophagitis: Secondary | ICD-10-CM | POA: Diagnosis not present

## 2016-07-20 NOTE — Patient Instructions (Addendum)
Annual physical exam in September  Fasting lipid, cmp, vit D due   It is important that you exercise regularly at least 30 minutes 5 times a week. If you develop chest pain, have severe difficulty breathing, or feel very tired, stop exercising immediately and seek medical attention   Thank you  for choosing [facility]. We consider it a privelige to serve you.  Delivering excellent health care in a caring and  compassionate way is our goal.  Partnering with you,  so that together we can achieve this goal is our strategy.

## 2016-07-20 NOTE — Patient Instructions (Addendum)
Kristy Jarvis , Thank you for taking time to come for your Medicare Wellness Visit. I appreciate your ongoing commitment to your health goals. Please review the following plan we discussed and let me know if I can assist you in the future.   Screening recommendations: You are due for a repeat bone density scan and a Flu shot, if you change your mind and want either of these please call our office.   Abnormal screenings: None  Patient concerns: None  Nurse concerns: None  Next appt: Follow up in 1 year for your annual wellness visit with me.  Please bring a copy of your Living Will to your next appointment. If you can not find it I have provided you with a blank copy today. Please fill this out and have notarized and bring a copy to office.    Preventive Care 81 Years and Older, Female Preventive care refers to lifestyle choices and visits with your health care provider that can promote health and wellness. What does preventive care include?  A yearly physical exam. This is also called an annual well check.  Dental exams once or twice a year.  Routine eye exams. Ask your health care provider how often you should have your eyes checked.  Personal lifestyle choices, including:  Daily care of your teeth and gums.  Regular physical activity.  Eating a healthy diet.  Avoiding tobacco and drug use.  Limiting alcohol use.  Taking low-dose aspirin every day.  Taking vitamin and mineral supplements as recommended by your health care provider. What happens during an annual well check? The services and screenings done by your health care provider during your annual well check will depend on your age, overall health, lifestyle risk factors, and family history of disease. Counseling  Your health care provider may ask you questions about your:  Alcohol use.  Tobacco use.  Drug use.  Emotional well-being.  Home and relationship well-being.  Sexual activity.  Eating  habits.  History of falls.  Memory and ability to understand (cognition).  Work and work Statistician.  Reproductive health. Screening  You may have the following tests or measurements:  Height, weight, and BMI.  Blood pressure.  Lipid and cholesterol levels. These may be checked every 5 years, or more frequently if you are over 48 years old.  Skin check.  Lung cancer screening. You may have this screening every year starting at age 86 if you have a 30-pack-year history of smoking and currently smoke or have quit within the past 15 years.  Fecal occult blood test (FOBT) of the stool. You may have this test every year starting at age 39.  Flexible sigmoidoscopy or colonoscopy. You may have a sigmoidoscopy every 5 years or a colonoscopy every 10 years starting at age 56.  Hepatitis C blood test.  Diabetes screening. This is done by checking your blood sugar (glucose) after you have not eaten for a while (fasting). You may have this done every 1-3 years.  Bone density scan. This is done to screen for osteoporosis. You may have this done starting at age 11.  Mammogram. This may be done every 1-2 years. Talk to your health care provider about how often you should have regular mammograms. Talk with your health care provider about your test results, treatment options, and if necessary, the need for more tests. Vaccines  Your health care provider may recommend certain vaccines, such as:  Influenza vaccine. This is recommended every year.  Tetanus, diphtheria, and acellular  pertussis (Tdap, Td) vaccine. You may need a Td booster every 10 years.  Zoster vaccine. You may need this after age 53.  Pneumococcal 13-valent conjugate (PCV13) vaccine. One dose is recommended after age 86.  Pneumococcal polysaccharide (PPSV23) vaccine. One dose is recommended after age 72.  Talk to your health care provider about which screenings and vaccines you need and how often you need them. This  information is not intended to replace advice given to you by your health care provider. Make sure you discuss any questions you have with your health care provider. Document Released: 06/04/2015 Document Revised: 01/26/2016 Document Reviewed: 03/09/2015 Elsevier Interactive Patient Education  2017 [employer].

## 2016-07-20 NOTE — Progress Notes (Signed)
Subjective:   Kristy Jarvis is a 83 y.o. female who presents for Medicare Annual (Subsequent) preventive examination.  Cardiac Risk Factors include: advanced age (>70men, >89 women);dyslipidemia     Objective:     Vitals: BP 128/82   Pulse 75   Temp 98.6 F (37 C) (Oral)   Ht 5' (1.524 m)   Wt 103 lb (46.7 kg)   SpO2 98%   BMI 20.12 kg/m   Body mass index is 20.12 kg/m.   Tobacco History  Smoking Status  . Never Smoker  Smokeless Tobacco  . Never Used     Counseling given: Not Answered   Past Medical History:  Diagnosis Date  . Cataract 2013   right, no surgery at this time  . Diverticulosis of colon    by CT  . GERD (gastroesophageal reflux disease)   . Hyperlipidemia   . Intermittent vertigo   . Osteoporosis   . PONV (postoperative nausea and vomiting)   . Renal colic    Past Surgical History:  Procedure Laterality Date  . ABDOMINAL HYSTERECTOMY    . BUNIONECTOMY  06/27/2011   left , Dr Berline Lopes  . CATARACT EXTRACTION  2002   left eye   . CHOLECYSTECTOMY  1989  . ESOPHAGOGASTRODUODENOSCOPY  Jan  2007   prominent schatzki's ring, s/p 59F, small to moderate size hh  . EYE SURGERY  2005 approx   cataract left  . HIP SURGERY Left 12/20/2013  . INTRAMEDULLARY (IM) NAIL INTERTROCHANTERIC Left 01/02/2014   Procedure: INTRAMEDULLARY (IM) NAIL INTERTROCHANTRIC;  Surgeon: Newt Minion, MD;  Location: [facility];  Service: Orthopedics;  Laterality: Left;   Family History  Problem Relation Age of Onset  . Heart attack Father   . Diabetes Sister   . Diabetes Sister   . Diabetes Sister   . Parkinson's disease Sister   . Liver cancer      family history   . Heart disease Brother   . Prostate cancer Brother   . Colon cancer Neg Hx   . Anesthesia problems Neg Hx   . Hypotension Neg Hx   . Malignant hyperthermia Neg Hx   . Pseudochol deficiency Neg Hx    History  Sexual Activity  . Sexual activity: No    Outpatient Encounter Prescriptions as of  07/20/2016  Medication Sig  . calcium carbonate (TUMS EX) 750 MG chewable tablet Chew 2 tablets by mouth daily.   Marland Kitchen lovastatin (MEVACOR) 40 MG tablet TAKE (1) TABLET BY MOUTH AT BEDTIME.  . Melatonin 300 MCG TABS Take 1 tablet by mouth at bedtime.  Marland Kitchen omeprazole (PRILOSEC) 20 MG capsule TAKE (1) CAPSULE BY MOUTH AT BEDTIME.  . ranitidine (ZANTAC) 150 MG tablet TAKE (1) TABLET BY MOUTH TWICE DAILY.  Marland Kitchen Vitamin D, Ergocalciferol, (DRISDOL) 50000 units CAPS capsule TAKE 1 CAPSULE BY MOUTH ONCE A WEEK.   No facility-administered encounter medications on file as of 07/20/2016.     Activities of Daily Living In your present state of health, do you have any difficulty performing the following activities: 07/20/2016  Hearing? Y  Vision? N  Difficulty concentrating or making decisions? Y  Walking or climbing stairs? Y  Dressing or bathing? N  Doing errands, shopping? Y  Preparing Food and eating ? N  Using the Toilet? N  In the past six months, have you accidently leaked urine? N  Do you have problems with loss of bowel control? N  Managing your Medications? Y  Managing your Finances? [name]  Housekeeping or managing your Housekeeping? N  Some recent data might be hidden    Patient Care Team: Fayrene Helper, MD as PCP - General Daneil Dolin, MD (Gastroenterology) Cleon Gustin, MD as Consulting Physician (Urology) Cristal Deer, DPM as Attending Physician (Podiatry) Carole Civil, MD as Consulting Physician (Orthopedic Surgery) Leta Baptist, MD as Consulting Physician (Otolaryngology)    Assessment:    Exercise Activities and Dietary recommendations Current Exercise Habits: Home exercise routine, Type of exercise: stretching;walking (walks when weather permits), Time (Minutes): 15, Frequency (Times/Week): 5, Weekly Exercise (Minutes/Week): 75, Intensity: Mild, Exercise limited by: None identified  Goals    . Increase water intake          Recommend increasing water intake to 5-6  glasses a day.      Fall Risk Fall Risk  07/20/2016 07/20/2016 12/21/2015 05/05/2015 12/03/2014  Falls in the past year? No Yes Yes No Yes  Number falls in past yr: - 1 1 - -  Injury with Fall? - Yes Yes - -   Depression Screen PHQ 2/9 Scores 07/20/2016 07/20/2016 05/05/2015 07/30/2013  PHQ - 2 Score 0 0 0 0  PHQ- 9 Score - - - 0     Cognitive Function: Normal MMSE - Mini Mental State Exam 07/20/2016 07/13/2014  Orientation to time 3 5  Orientation to Place 5 5  Registration 3 3  Attention/ Calculation 5 5  Recall 1 2  Language- name 2 objects 2 2  Language- repeat 1 1  Language- follow 3 step command 3 3  Language- read & follow direction 1 1  Write a sentence 1 1  Copy design 1 1  Total score 26 29        Immunization History  Administered Date(s) Administered  . Influenza Split 02/22/2011, 02/14/2012  . Influenza,inj,Quad PF,36+ Mos 03/05/2013, 03/09/2014, 03/09/2015  . Pneumococcal Conjugate-13 07/13/2014  . Pneumococcal Polysaccharide-23 09/12/2010  . Td 09/17/2008   Screening Tests Health Maintenance  Topic Date Due  . INFLUENZA VACCINE  08/19/2016 (Originally 12/21/2015)  . TETANUS/TDAP  09/18/2018  . DEXA SCAN  Completed  . PNA vac Low Risk Adult  Completed      Plan:  I have personally reviewed and addressed the Medicare Annual Wellness questionnaire and have noted the following in the patient's chart:  A. Medical and social history B. Use of alcohol, tobacco or illicit drugs  C. Current medications and supplements D. Functional ability and status E.  Nutritional status F.  Physical activity G. Advance directives H. List of other physicians I.  Hospitalizations, surgeries, and ER visits in previous 12 months J.  [place] to include cognitive, depression, and falls L. Referrals and appointments - none  In addition, I have reviewed and discussed with patient certain preventive protocols, quality metrics, and best practice recommendations. A written  personalized care plan for preventive services as well as general preventive health recommendations were provided to patient.  Signed,   Stormy Fabian, LPN Lead Nurse Health Advisor

## 2016-07-22 NOTE — Assessment & Plan Note (Signed)
Hyperlipidemia:Low fat diet discussed and encouraged.   Lipid Panel  Lab Results  Component Value Date   CHOL 169 12/16/2015   HDL 74 12/16/2015   LDLCALC 66 12/16/2015   LDLDIRECT 85 06/17/2012   TRIG 146 12/16/2015   CHOLHDL 2.3 12/16/2015     Updated lab needed at/ before next visit.

## 2016-07-22 NOTE — Assessment & Plan Note (Signed)
Controlled, no change in medication  

## 2016-07-22 NOTE — Assessment & Plan Note (Signed)
No falls since last visit, home safety and fall reduction reviewed briefly

## 2016-07-22 NOTE — Progress Notes (Signed)
   Kristy Jarvis     MRN: [id number]      DOB: 15-Apr-1921   HPI Kristy Jarvis is here for follow up and re-evaluation of chronic medical conditions, medication management and review of any available recent lab and radiology data.  Preventive health is updated, specifically   Immunization.    There are no new concerns.  There are no specific complaints   ROS Denies recent fever or chills. Denies sinus pressure, nasal congestion, ear pain or sore throat. Denies chest congestion, productive cough or wheezing. Denies chest pains, palpitations and leg swelling Denies abdominal pain, nausea, vomiting,diarrhea or constipation.   Denies dysuria, frequency, hesitancy or incontinence. C/o  limitation in mobility.Depends on cane, no falls since last visit Denies headaches, seizures, numbness, or tingling. Denies depression, anxiety or insomnia. Denies skin break down or rash.   PE  BP 128/82   Pulse 75   Resp 15   Ht 5' (1.524 m)   Wt 103 lb (46.7 kg)   SpO2 98%   BMI 20.12 kg/m   Patient alert and oriented and in no cardiopulmonary distress.  HEENT: No facial asymmetry, EOMI,   oropharynx pink and moist.  Neck decreased ROM no JVD, no mass.  Chest: Clear to auscultation bilaterally.  CVS: S1, S2 no murmurs, no S3.Regular rate.  ABD: Soft non tender.   Ext: No edema  MS: Decreased ROM spine, shoulders, hips and knees.  Skin: Intact, no ulcerations or rash noted.  Psych: Good eye contact, normal affect. Memory intact not anxious or depressed appearing.  CNS: CN 2-12 intact, power,  normal throughout.no focal deficits noted.   Assessment & Plan Hyperlipemia Hyperlipidemia:Low fat diet discussed and encouraged.   Lipid Panel  Lab Results  Component Value Date   CHOL 169 12/16/2015   HDL 74 12/16/2015   LDLCALC 66 12/16/2015   LDLDIRECT 85 06/17/2012   TRIG 146 12/16/2015   CHOLHDL 2.3 12/16/2015     Updated lab needed at/ before next  visit.   GERD Controlled, no change in medication   Osteoporosis Continue current meds, encouraged regular weight bearing physical activity also.  At high risk for falls No falls since last visit, home safety and fall reduction reviewed briefly

## 2016-07-22 NOTE — Assessment & Plan Note (Signed)
Continue current meds, encouraged regular weight bearing physical activity also.

## 2016-07-24 IMAGING — MR MR HEAD W/O CM
6 of 10 series · 21 of 48 positions shown · non-contrast
Comparison: Head CT 06/04/2015

CLINICAL DATA: Falling recently with trauma to the head.

EXAM:
MRI HEAD WITHOUT CONTRAST
TECHNIQUE: Multiplanar, multiecho pulse sequences of the brain and surrounding
structures were obtained without intravenous contrast.

[Series 5: T1 · sagittal · 5.0mm · 0.39mm/px · 2 of 21 slices shown (1 of 2)]
[im 1/21]
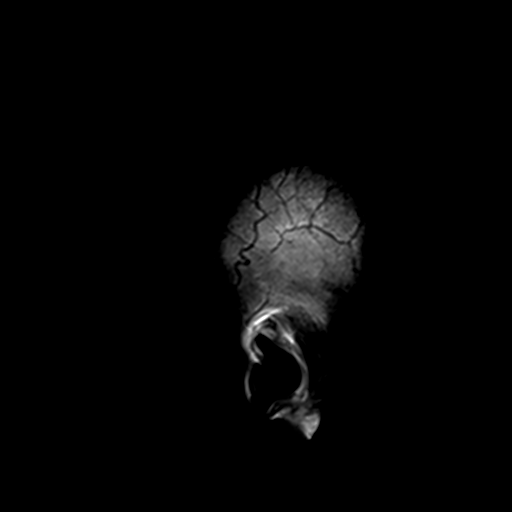
[im 21/21]
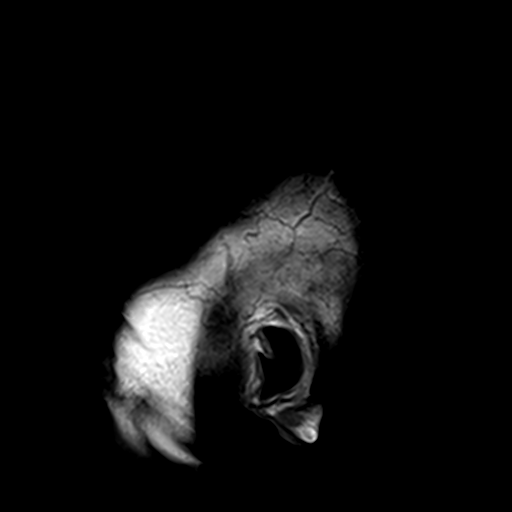

[Series 6: T2 · axial · 5.0mm · 0.44mm/px · z∈[-41,+100]mm · 3 of 23 slices shown (1 of 2)]
[im 1/23]
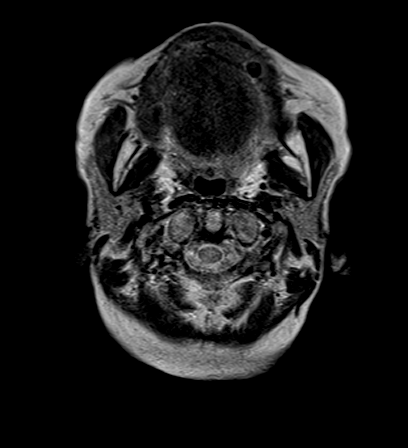
[im 12/23]
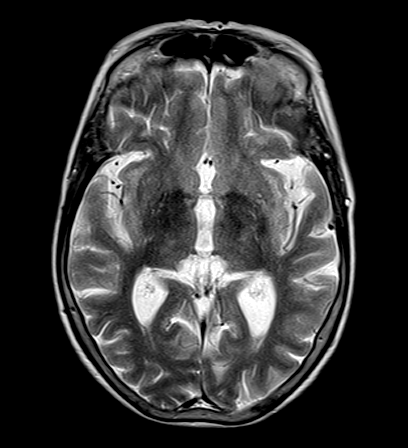
[im 23/23]
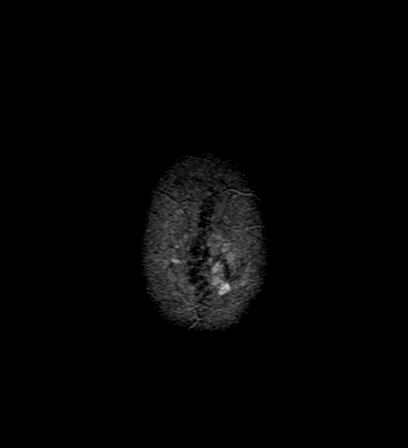

[Series 7: FLAIR · axial · 5.0mm · 0.32mm/px · z∈[-40,+101]mm · 3 of 23 slices shown]
[im 1/23]
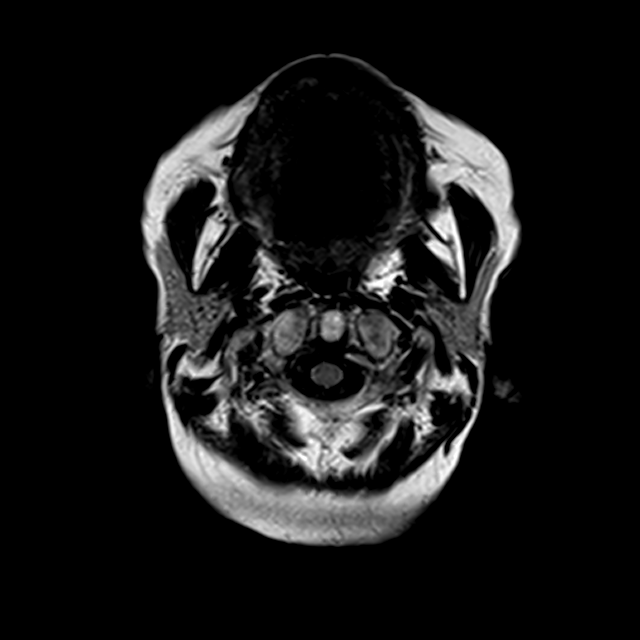
[im 12/23]
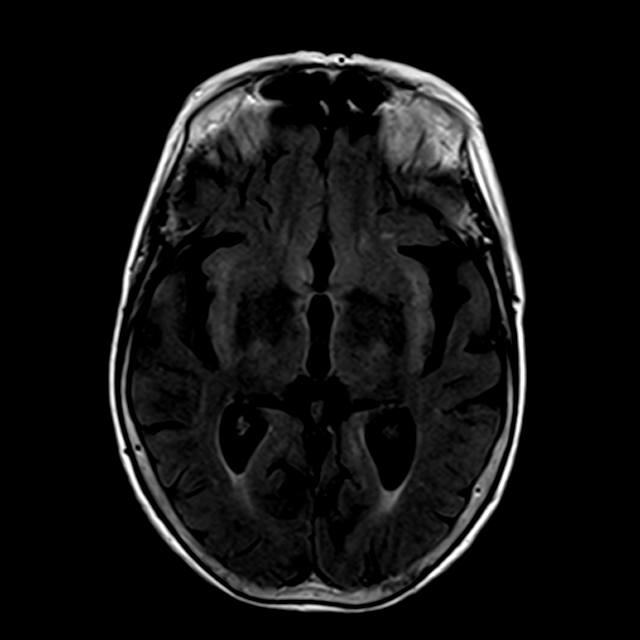
[im 23/23]
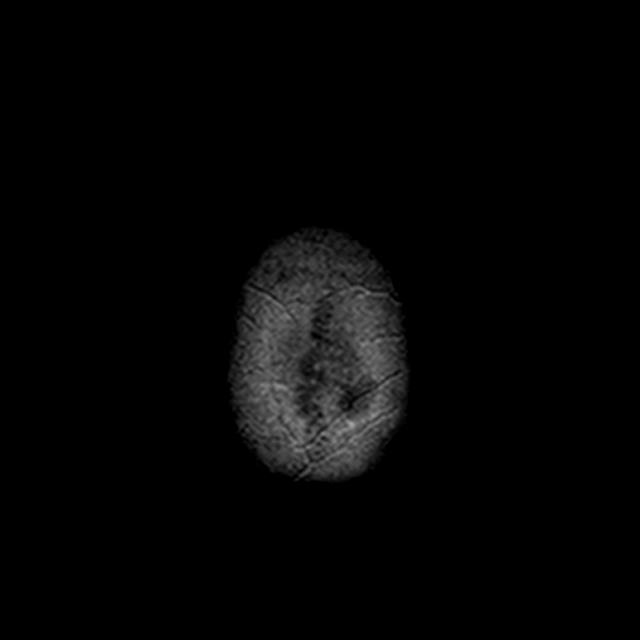

[Series 8: T1 · axial · 2.0mm · 0.39mm/px · z∈[-28,+132]mm · 8 of 82 slices shown (2 of 2)]
[im 1/82]
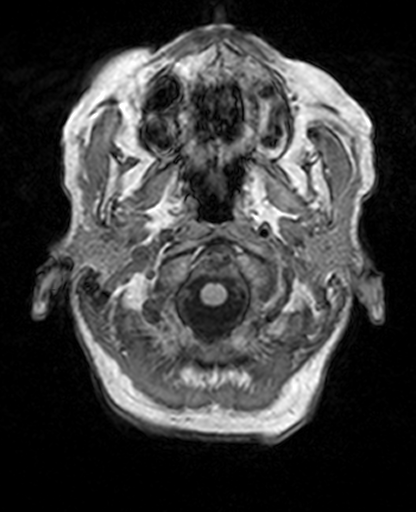
[im 10/82]
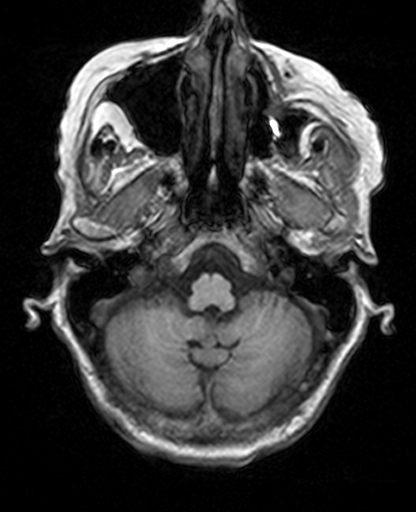
[im 28/82]
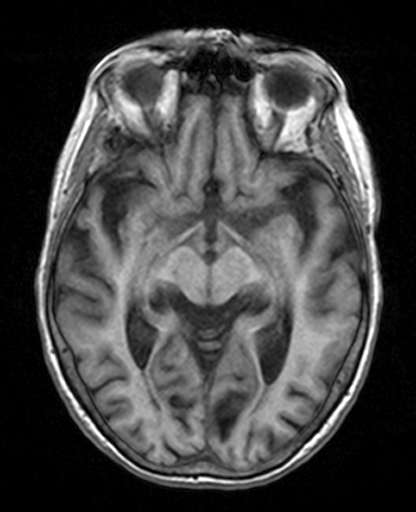
[im 37/82]
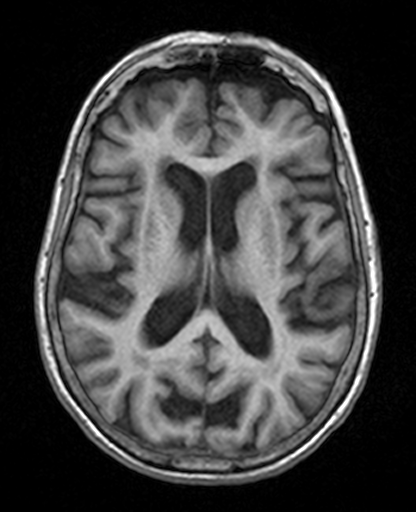
[im 46/82]
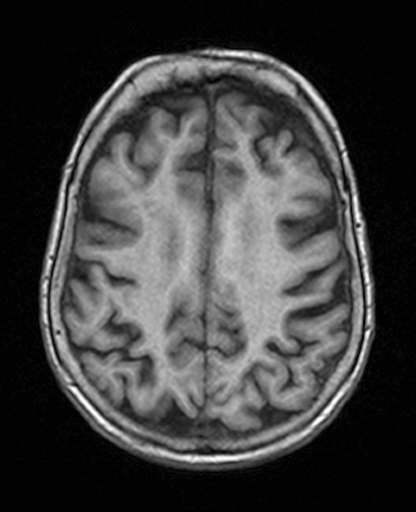
[im 55/82]
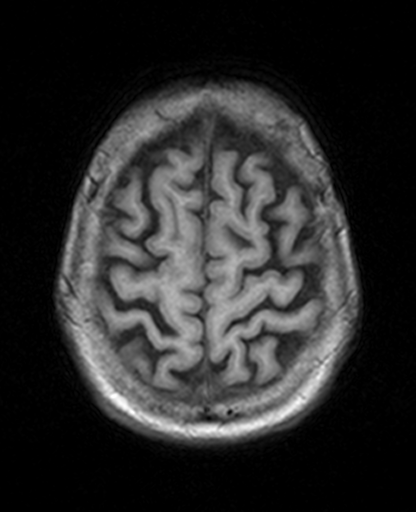
[im 73/82]
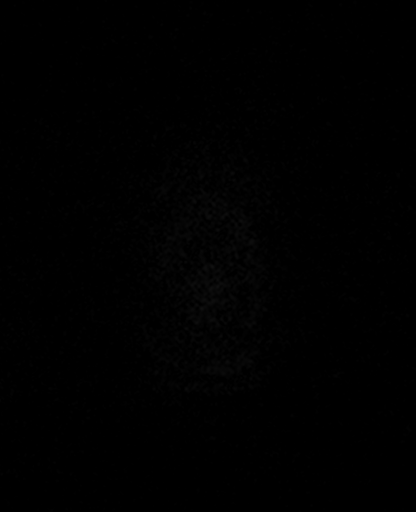
[im 82/82]
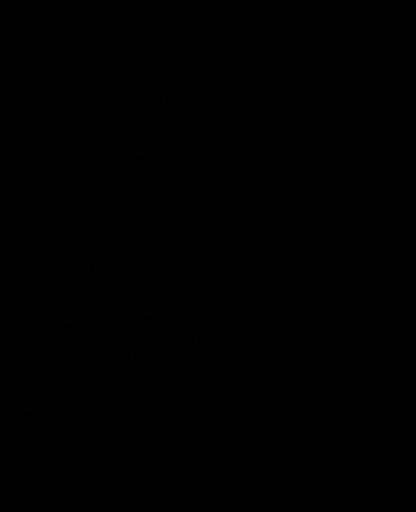

[Series 9: trauma axial · axial · 5.0mm · 0.39mm/px · z∈[-41,+29]mm · 2 of 23 slices shown]
[im 1/23]
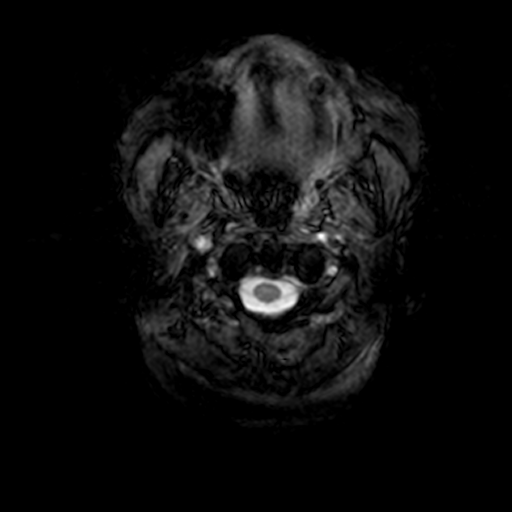
[im 12/23]
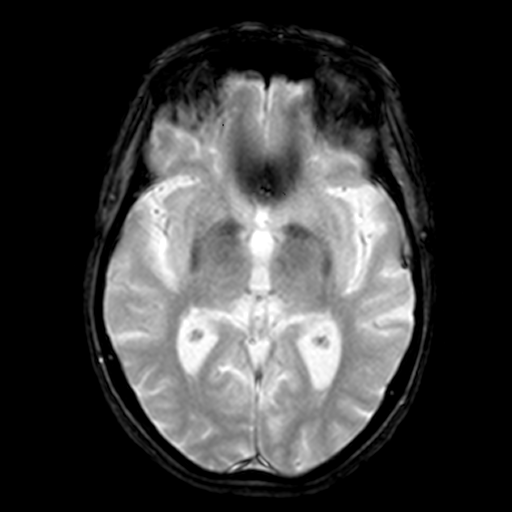

[Series 10: T2 · coronal · 5.0mm · 0.40mm/px · 3 of 28 slices shown (2 of 2)]
[im 1/28]
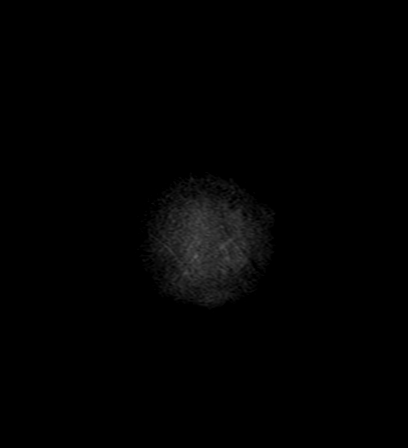
[im 14/28]
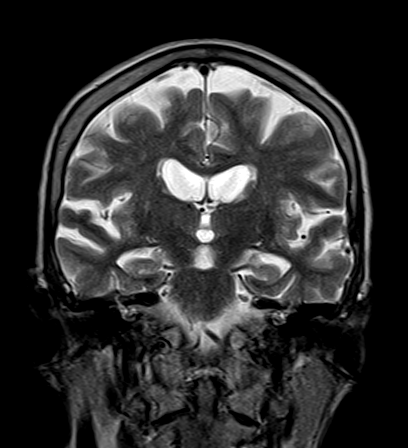
[im 28/28]
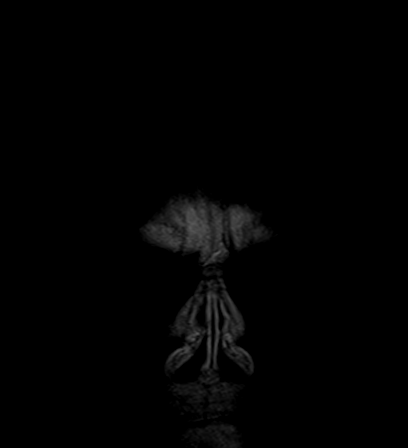

[21 of 48 positions shown; findings below may reference images not displayed]

FINDINGS: Diffusion imaging does not show any acute or subacute infarction.
The brainstem and cerebellum are normal. The cerebral hemispheres
show age related volume loss with mild chronic small-vessel disease
of the deep white matter, less than often seen and persons of this
age. No cortical or large vessel territory infarction. No mass
lesion, hemorrhage, hydrocephalus or extra-axial collection. No
pituitary mass. Displaced facial fractures on the left re-
demonstrated, apparently sustained 3 weeks ago.
IMPRESSION: No acute or reversible intracranial finding. Atrophy and chronic
small vessel disease, typical for age.

Displaced facial fractures on the left, not primarily or completely
evaluated on this study. Apparently these were acute 3 weeks ago.

## 2016-08-03 DIAGNOSIS — E559 Vitamin D deficiency, unspecified: Secondary | ICD-10-CM | POA: Diagnosis not present

## 2016-08-03 DIAGNOSIS — E785 Hyperlipidemia, unspecified: Secondary | ICD-10-CM | POA: Diagnosis not present

## 2016-08-03 LAB — COMPREHENSIVE METABOLIC PANEL
ALBUMIN: 4.3 g/dL (ref 3.6–5.1)
ALK PHOS: 66 U/L (ref 33–130)
ALT: 11 U/L (ref 6–29)
AST: 22 U/L (ref 10–35)
BILIRUBIN TOTAL: 0.8 mg/dL (ref 0.2–1.2)
BUN: 17 mg/dL (ref 7–25)
CALCIUM: 10.2 mg/dL (ref 8.6–10.4)
CO2: 27 mmol/L (ref 20–31)
Chloride: 104 mmol/L (ref 98–110)
Creat: 1.15 mg/dL — ABNORMAL HIGH (ref 0.60–0.88)
GLUCOSE: 99 mg/dL (ref 65–99)
Potassium: 4.5 mmol/L (ref 3.5–5.3)
Sodium: 144 mmol/L (ref 135–146)
TOTAL PROTEIN: 6.8 g/dL (ref 6.1–8.1)

## 2016-08-03 LAB — LIPID PANEL
CHOLESTEROL: 175 mg/dL (ref <200)
HDL: 80 mg/dL (ref >50)
LDL CALC: 64 mg/dL (ref <100)
Total CHOL/HDL Ratio: 2.2 Ratio (ref <5.0)
Triglycerides: 155 mg/dL — ABNORMAL HIGH (ref <150)
VLDL: 31 mg/dL — AB (ref <30)

## 2016-08-04 ENCOUNTER — Other Ambulatory Visit: Payer: Self-pay | Admitting: Family Medicine

## 2016-08-04 ENCOUNTER — Encounter: Payer: Self-pay | Admitting: Family Medicine

## 2016-08-04 LAB — VITAMIN D 25 HYDROXY (VIT D DEFICIENCY, FRACTURES): VIT D 25 HYDROXY: 72 ng/mL (ref 30–100)

## 2016-08-22 ENCOUNTER — Other Ambulatory Visit: Payer: Self-pay | Admitting: Urology

## 2016-08-22 DIAGNOSIS — N200 Calculus of kidney: Secondary | ICD-10-CM

## 2016-08-29 ENCOUNTER — Ambulatory Visit (HOSPITAL_COMMUNITY)
Admission: RE | Admit: 2016-08-29 | Discharge: 2016-08-29 | Disposition: A | Discharge status: Home / Self Care | Payer: Medicare Other | Source: Ambulatory Visit | Attending: Urology | Admitting: Urology

## 2016-08-29 DIAGNOSIS — N183 Chronic kidney disease, stage 3 (moderate): Secondary | ICD-10-CM | POA: Diagnosis not present

## 2016-08-29 DIAGNOSIS — N200 Calculus of kidney: Secondary | ICD-10-CM

## 2016-12-17 ENCOUNTER — Emergency Department (HOSPITAL_COMMUNITY): Payer: Medicare Other

## 2016-12-17 ENCOUNTER — Observation Stay (HOSPITAL_COMMUNITY)
Admission: EM | Admit: 2016-12-17 | Discharge: 2016-12-19 | Disposition: A | Discharge status: Home / Self Care | Payer: Medicare Other | Attending: Internal Medicine | Admitting: Internal Medicine

## 2016-12-17 ENCOUNTER — Encounter (HOSPITAL_COMMUNITY): Payer: Self-pay | Admitting: Emergency Medicine

## 2016-12-17 DIAGNOSIS — K219 Gastro-esophageal reflux disease without esophagitis: Secondary | ICD-10-CM | POA: Diagnosis not present

## 2016-12-17 DIAGNOSIS — E785 Hyperlipidemia, unspecified: Secondary | ICD-10-CM | POA: Diagnosis present

## 2016-12-17 DIAGNOSIS — I517 Cardiomegaly: Secondary | ICD-10-CM | POA: Diagnosis not present

## 2016-12-17 DIAGNOSIS — I129 Hypertensive chronic kidney disease with stage 1 through stage 4 chronic kidney disease, or unspecified chronic kidney disease: Secondary | ICD-10-CM | POA: Insufficient documentation

## 2016-12-17 DIAGNOSIS — Z79899 Other long term (current) drug therapy: Secondary | ICD-10-CM | POA: Diagnosis not present

## 2016-12-17 DIAGNOSIS — N183 Chronic kidney disease, stage 3 unspecified: Secondary | ICD-10-CM | POA: Diagnosis present

## 2016-12-17 DIAGNOSIS — R42 Dizziness and giddiness: Secondary | ICD-10-CM | POA: Diagnosis not present

## 2016-12-17 DIAGNOSIS — N184 Chronic kidney disease, stage 4 (severe): Secondary | ICD-10-CM | POA: Diagnosis present

## 2016-12-17 DIAGNOSIS — I10 Essential (primary) hypertension: Secondary | ICD-10-CM | POA: Diagnosis present

## 2016-12-17 LAB — CBC
HEMATOCRIT: 48.9 % — AB (ref 36.0–46.0)
HEMOGLOBIN: 16.4 g/dL — AB (ref 12.0–15.0)
MCH: 30.4 pg (ref 26.0–34.0)
MCHC: 33.5 g/dL (ref 30.0–36.0)
MCV: 90.7 fL (ref 78.0–100.0)
Platelets: 225 10*3/uL (ref 150–400)
RBC: 5.39 MIL/uL — ABNORMAL HIGH (ref 3.87–5.11)
RDW: 13.8 % (ref 11.5–15.5)
WBC: 6.8 10*3/uL (ref 4.0–10.5)

## 2016-12-17 LAB — BASIC METABOLIC PANEL
ANION GAP: 11 (ref 5–15)
BUN: 18 mg/dL (ref 6–20)
CALCIUM: 10.1 mg/dL (ref 8.9–10.3)
CHLORIDE: 108 mmol/L (ref 101–111)
CO2: 26 mmol/L (ref 22–32)
Creatinine, Ser: 1.15 mg/dL — ABNORMAL HIGH (ref 0.44–1.00)
GFR calc Af Amer: 46 mL/min — ABNORMAL LOW (ref >60)
GFR calc non Af Amer: 40 mL/min — ABNORMAL LOW (ref >60)
GLUCOSE: 116 mg/dL — AB (ref 65–99)
Potassium: 3.8 mmol/L (ref 3.5–5.1)
Sodium: 145 mmol/L (ref 135–145)

## 2016-12-17 LAB — URINALYSIS, ROUTINE W REFLEX MICROSCOPIC
Bilirubin Urine: NEGATIVE
Glucose, UA: NEGATIVE mg/dL
Ketones, ur: NEGATIVE mg/dL
Leukocytes, UA: NEGATIVE
Nitrite: NEGATIVE
PROTEIN: NEGATIVE mg/dL
SPECIFIC GRAVITY, URINE: 1.004 — AB (ref 1.005–1.030)
pH: 8 (ref 5.0–8.0)

## 2016-12-17 LAB — CBG MONITORING, ED: GLUCOSE-CAPILLARY: 112 mg/dL — AB (ref 65–99)

## 2016-12-17 MED ORDER — ONDANSETRON HCL 4 MG/2ML IJ SOLN
4.0000 mg | Freq: Four times a day (QID) | INTRAMUSCULAR | Status: DC | PRN
  Administered 2016-12-18: 4 mg via INTRAVENOUS
  Filled 2016-12-17: qty 2

## 2016-12-17 MED ORDER — POTASSIUM CHLORIDE IN NACL 20-0.9 MEQ/L-% IV SOLN
INTRAVENOUS | Status: DC
  Administered 2016-12-17 – 2016-12-18 (×3): via INTRAVENOUS

## 2016-12-17 MED ORDER — PANTOPRAZOLE SODIUM 40 MG PO TBEC
40.0000 mg | DELAYED_RELEASE_TABLET | Freq: Every day | ORAL | Status: DC
  Administered 2016-12-17 – 2016-12-18 (×2): 40 mg via ORAL
  Filled 2016-12-17 (×2): qty 1

## 2016-12-17 MED ORDER — HYDRALAZINE HCL 20 MG/ML IJ SOLN
10.0000 mg | INTRAMUSCULAR | Status: DC | PRN
  Administered 2016-12-17: 10 mg via INTRAVENOUS
  Filled 2016-12-17: qty 1

## 2016-12-17 MED ORDER — ENOXAPARIN SODIUM 30 MG/0.3ML ~~LOC~~ SOLN
30.0000 mg | SUBCUTANEOUS | Status: DC
  Administered 2016-12-17 – 2016-12-18 (×2): 30 mg via SUBCUTANEOUS
  Filled 2016-12-17 (×2): qty 0.3

## 2016-12-17 MED ORDER — CALCIUM CARBONATE ANTACID 500 MG PO CHEW
2.0000 | CHEWABLE_TABLET | Freq: Every day | ORAL | Status: DC
  Administered 2016-12-17 – 2016-12-19 (×3): 400 mg via ORAL
  Filled 2016-12-17 (×5): qty 2

## 2016-12-17 MED ORDER — MECLIZINE HCL 12.5 MG PO TABS
25.0000 mg | ORAL_TABLET | Freq: Three times a day (TID) | ORAL | Status: DC
  Administered 2016-12-17 – 2016-12-19 (×6): 25 mg via ORAL
  Filled 2016-12-17 (×6): qty 2

## 2016-12-17 MED ORDER — PRAVASTATIN SODIUM 40 MG PO TABS
40.0000 mg | ORAL_TABLET | Freq: Every day | ORAL | Status: DC
  Administered 2016-12-17 – 2016-12-18 (×2): 40 mg via ORAL
  Filled 2016-12-17 (×2): qty 1

## 2016-12-17 MED ORDER — SODIUM CHLORIDE 0.9 % IV BOLUS (SEPSIS)
1000.0000 mL | Freq: Once | INTRAVENOUS | Status: AC
  Administered 2016-12-17: 1000 mL via INTRAVENOUS

## 2016-12-17 MED ORDER — ONDANSETRON HCL 4 MG/2ML IJ SOLN
4.0000 mg | Freq: Once | INTRAMUSCULAR | Status: AC
  Administered 2016-12-17: 4 mg via INTRAVENOUS
  Filled 2016-12-17: qty 2

## 2016-12-17 MED ORDER — FAMOTIDINE 20 MG PO TABS
20.0000 mg | ORAL_TABLET | Freq: Two times a day (BID) | ORAL | Status: DC
  Administered 2016-12-17 – 2016-12-19 (×4): 20 mg via ORAL
  Filled 2016-12-17 (×4): qty 1

## 2016-12-17 MED ORDER — ACETAMINOPHEN 325 MG PO TABS: 650.0000 mg | ORAL_TABLET | Freq: Four times a day (QID) | ORAL | Status: DC | PRN

## 2016-12-17 MED ORDER — ONDANSETRON HCL 4 MG PO TABS: 4.0000 mg | ORAL_TABLET | Freq: Four times a day (QID) | ORAL | Status: DC | PRN

## 2016-12-17 MED ORDER — ACETAMINOPHEN 650 MG RE SUPP: 650.0000 mg | Freq: Four times a day (QID) | RECTAL | Status: DC | PRN

## 2016-12-17 NOTE — ED Triage Notes (Signed)
Patient c/o dizziness that started on Thursday. Per patient worse when standing up and moving. Patient states "feels like room is spinning." Denies any headache or chest pain. Generalized weakness reported. Family denies noting any facial drooping or slurred speech. Nausea x1 day reported but denies any vomiting, diarrhea, or fevers. Per family patient appears short of breath with exertion. Patient denies any shortness of breath.

## 2016-12-17 NOTE — ED Notes (Signed)
Report given to Sandra RN

## 2016-12-17 NOTE — ED Provider Notes (Signed)
[facility] DEPT Provider Note   CSN: [id number] Arrival date & time: 12/17/16  1102     History   Chief Complaint Chief Complaint  Patient presents with  . Dizziness    HPI Kristy Jarvis is a 83 y.o. female.  Pt presents to the ED today with dizziness that has been going on since 7/26.  Pt has the sensation of the room spinning.  It is worse when she moves her head.  She does have a hx of vertigo.  Her daughter has been giving her meclizine, but that has just made her tired.  It has not helped the dizziness.  The daughter has also been pushing fluids in case it was from dehydration.  No change in sx.  Pt denies any pain.  Daughter has noticed some sob with walking.      Past Medical History:  Diagnosis Date  . Cataract 2013   right, no surgery at this time  . Diverticulosis of colon    by CT  . GERD (gastroesophageal reflux disease)   . Hyperlipidemia   . Intermittent vertigo   . Osteoporosis   . PONV (postoperative nausea and vomiting)   . Renal colic     Patient Active Problem List   Diagnosis Date Noted  . Vertigo 12/17/2016  . At high risk for falls 07/05/2015  . Closed fracture of orbital floor ([place]) 06/07/2015  . CKD (chronic kidney disease) stage 3, GFR 30-59 ml/min 05/24/2015  . Right kidney stone 02/22/2015  . Hyperglycemia 03/25/2013  . MCI (mild cognitive impairment) 10/23/2012  . Hyperlipemia 08/13/2007  . GERD 08/13/2007  . Osteoporosis 08/13/2007  . INTERMITTENT VERTIGO 08/13/2007    Past Surgical History:  Procedure Laterality Date  . ABDOMINAL HYSTERECTOMY    . BUNIONECTOMY  06/27/2011   left , Dr Berline Lopes  . CATARACT EXTRACTION  2002   left eye   . CHOLECYSTECTOMY  1989  . ESOPHAGOGASTRODUODENOSCOPY  Jan  2007   prominent schatzki's ring, s/p 53F, small to moderate size hh  . EYE SURGERY  2005 approx   cataract left  . HIP SURGERY Left 12/20/2013  . INTRAMEDULLARY (IM) NAIL INTERTROCHANTERIC Left 01/02/2014   Procedure:  INTRAMEDULLARY (IM) NAIL INTERTROCHANTRIC;  Surgeon: Newt Minion, MD;  Location: [facility];  Service: Orthopedics;  Laterality: Left;    OB History    No data available       Home Medications    Prior to Admission medications   Medication Sig Start Date End Date Taking? Authorizing Provider  calcium carbonate (TUMS EX) 750 MG chewable tablet Chew 2 tablets by mouth daily.    Yes [provider]  Cholecalciferol (VITAMIN D PO) Take 1 tablet by mouth daily.   Yes [provider]  lovastatin (MEVACOR) 40 MG tablet TAKE (1) TABLET BY MOUTH AT BEDTIME. 04/11/16  Yes Fayrene Helper, MD  Melatonin 300 MCG TABS Take 1 tablet by mouth at bedtime.   Yes [provider]  omeprazole (PRILOSEC) 20 MG capsule TAKE (1) CAPSULE BY MOUTH AT BEDTIME. 07/14/16  Yes Fayrene Helper, MD  ranitidine (ZANTAC) 150 MG tablet TAKE (1) TABLET BY MOUTH TWICE DAILY. 07/14/16  Yes Fayrene Helper, MD    Family History Family History  Problem Relation Age of Onset  . Heart attack Father   . Diabetes Sister   . Diabetes Sister   . Diabetes Sister   . Parkinson's disease Sister   . Liver cancer Unknown  family history   . Heart disease Brother   . Prostate cancer Brother   . Colon cancer Neg Hx   . Anesthesia problems Neg Hx   . Hypotension Neg Hx   . Malignant hyperthermia Neg Hx   . Pseudochol deficiency Neg Hx     Social History Social History  Substance Use Topics  . Smoking status: Never Smoker  . Smokeless tobacco: Never Used  . Alcohol use No     Allergies   Alendronate sodium and Penicillins   Review of Systems Review of Systems  Neurological: Positive for dizziness.  All other systems reviewed and are negative.    Physical Exam Updated Vital Signs BP (!) 159/78 (BP Location: Right Arm)   Pulse (!) 55   Resp 16   Ht 5' (1.524 m)   Wt 45.4 kg (100 lb)   SpO2 100%   BMI 19.53 kg/m   Physical Exam  Constitutional: She is oriented  to person, place, and time. She appears well-developed and well-nourished.  HENT:  Head: Normocephalic and atraumatic.  Right Ear: External ear normal.  Left Ear: External ear normal.  Nose: Nose normal.  Mouth/Throat: Oropharynx is clear and moist.  Eyes: Pupils are equal, round, and reactive to light. Conjunctivae and EOM are normal.  Neck: Normal range of motion. Neck supple.  Cardiovascular: Normal rate, regular rhythm, normal heart sounds and intact distal pulses.   Pulmonary/Chest: Effort normal and breath sounds normal.  Abdominal: Soft. Bowel sounds are normal.  Musculoskeletal: Normal range of motion.  Neurological: She is alert and oriented to person, place, and time.  Skin: Skin is warm. Capillary refill takes less than 2 seconds.  Psychiatric: She has a normal mood and affect. Her behavior is normal. Judgment and thought content normal.  Nursing note and vitals reviewed.    ED Treatments / Results  Labs (all labs ordered are listed, but only abnormal results are displayed) Labs Reviewed  BASIC METABOLIC PANEL - Abnormal; Notable for the following:       Result Value   Glucose, Bld 116 (*)    Creatinine, Ser 1.15 (*)    GFR calc non Af Amer 40 (*)    GFR calc Af Amer 46 (*)    All other components within normal limits  CBC - Abnormal; Notable for the following:    RBC 5.39 (*)    Hemoglobin 16.4 (*)    HCT 48.9 (*)    All other components within normal limits  URINALYSIS, ROUTINE W REFLEX MICROSCOPIC - Abnormal; Notable for the following:    Color, Urine STRAW (*)    Specific Gravity, Urine 1.004 (*)    Hgb urine dipstick SMALL (*)    Bacteria, UA RARE (*)    Squamous Epithelial / LPF 0-5 (*)    All other components within normal limits  CBG MONITORING, ED - Abnormal; Notable for the following:    Glucose-Capillary 112 (*)    All other components within normal limits    EKG  EKG Interpretation  Date/Time:  Sunday December 17 2016 11:11:12 EDT Ventricular  Rate:  55 PR Interval:    QRS Duration: 98 QT Interval:  467 QTC Calculation: 447 R Axis:   41 Text Interpretation:  Sinus rhythm Low voltage, precordial leads Baseline wander in lead(s) I V1 Confirmed by Isla Pence [id number]) on 12/17/2016 11:18:26 AM       Radiology Dg Chest 1 View  Result Date: 12/17/2016 CLINICAL DATA:  Dizziness for several hours. EXAM:  CHEST 1 VIEW COMPARISON:  Chest radiograph 01/02/2014.  Abdominal CT 11/24/2015. FINDINGS: 1148 hours. Stable cardiomegaly, aortic atherosclerosis and moderate size hiatal hernia. The lungs are clear. There is no pleural effusion or pneumothorax. There is posttraumatic deformity of the left humeral head from previous fracture. No acute osseous findings are evident. Surgical clips are present in the right upper quadrant of the abdomen. IMPRESSION: No acute cardiopulmonary process. Cardiomegaly and moderate hiatal hernia. Electronically Signed   By: Richardean Sale M.D.   On: 12/17/2016 12:10   Ct Head Wo Contrast  Result Date: 12/17/2016 CLINICAL DATA:  Complains of dizziness. EXAM: CT HEAD WITHOUT CONTRAST TECHNIQUE: Contiguous axial images were obtained from the base of the skull through the vertex without intravenous contrast. COMPARISON:  06/25/2015 FINDINGS: Brain: Patchy areas of low attenuation throughout the subcortical and periventricular white matter identified compatible with chronic microvascular disease. There is prominence of the sulci and ventricles compatible with brain atrophy. Vascular: No hyperdense vessel or unexpected calcification. Skull: The calvarium appears intact. Sinuses/Orbits: Chronic deformity involving the anterior wall of the left maxillary sinus identified. The sinuses are otherwise clear. Other: None IMPRESSION: 1. No acute intracranial abnormalities. 2. Chronic small vessel ischemic disease and brain atrophy. Electronically Signed   By: Kerby Moors M.D.   On: 12/17/2016 12:04    Procedures Procedures  (including critical care time)  Medications Ordered in ED Medications  sodium chloride 0.9 % bolus 1,000 mL (0 mLs Intravenous Stopped 12/17/16 1331)  ondansetron (ZOFRAN) injection 4 mg (4 mg Intravenous Given 12/17/16 1150)     Initial Impression / Assessment and Plan / ED Course  I have reviewed the triage vital signs and the nursing notes.  Pertinent labs & imaging results that were available during my care of the patient were reviewed by me and considered in my medical decision making (see chart for details).    Pt feeling better after 1L NS, but she is still very dizzy when she stands up.  She was not able to stand long enough for orthostatics.  As pt is still unable to get up and walk, I spoke with Dr. Roderic Palau (triad) to bring pt in the hospital.   Final Clinical Impressions(s) / ED Diagnoses   Final diagnoses:  Dizziness    New Prescriptions New Prescriptions   No medications on file     Isla Pence, MD 12/17/16 1354

## 2016-12-17 NOTE — H&P (Signed)
History and Physical    [name] NWG:[id number] DOB: 04/23/1921 DOA: 12/17/2016  PCP: Fayrene Helper, MD  Patient coming from: Home  I have personally briefly reviewed patient's old medical records in [facility]  Chief Complaint: Dizziness  HPI: Kristy Jarvis is a 83 y.o. female with medical history significant of GERD, hypertension, hyperlipidemia presented to the hospital with complaints of dizziness. Symptoms began approximately 3 days ago. They've been persistent. She is having trouble ambulating due to dizziness. She has had nausea but no vomiting. Dizziness is worse when she stands and moves her head. She's not had any chest pain or shortness of breath. No fever or cough. No diarrhea or dysuria. She's not been on any new medications. She reports by mouth intake has been normal for her.  ED Course: Hemoglobin is mildly elevated at 16. Creatinine appeared to be at baseline at 1.1. EKG was nonacute. CT head did not show any acute findings. Chest x-ray does not show any significant findings. She received a liter of IV fluids with some improvement of her symptoms. She's been referred for observation.  Review of Systems: As per HPI otherwise 10 point review of systems negative.   Past Medical History:  Diagnosis Date  . Cataract 2013   right, no surgery at this time  . Diverticulosis of colon    by CT  . GERD (gastroesophageal reflux disease)   . Hyperlipidemia   . Intermittent vertigo   . Osteoporosis   . PONV (postoperative nausea and vomiting)   . Renal colic     Past Surgical History:  Procedure Laterality Date  . ABDOMINAL HYSTERECTOMY    . BUNIONECTOMY  06/27/2011   left , Dr Berline Lopes  . CATARACT EXTRACTION  2002   left eye   . CHOLECYSTECTOMY  1989  . ESOPHAGOGASTRODUODENOSCOPY  Jan  2007   prominent schatzki's ring, s/p 41F, small to moderate size hh  . EYE SURGERY  2005 approx   cataract left  . HIP SURGERY Left 12/20/2013  . INTRAMEDULLARY  (IM) NAIL INTERTROCHANTERIC Left 01/02/2014   Procedure: INTRAMEDULLARY (IM) NAIL INTERTROCHANTRIC;  Surgeon: Newt Minion, MD;  Location: [facility];  Service: Orthopedics;  Laterality: Left;     reports that she has never smoked. She has never used smokeless tobacco. She reports that she does not drink alcohol or use drugs.  Allergies  Allergen Reactions  . Alendronate Sodium Other (See Comments)    Chest Pain, Fatigue   . Penicillins Rash        Family History  Problem Relation Age of Onset  . Heart attack Father   . Diabetes Sister   . Diabetes Sister   . Diabetes Sister   . Parkinson's disease Sister   . Liver cancer Unknown        family history   . Heart disease Brother   . Prostate cancer Brother   . Colon cancer Neg Hx   . Anesthesia problems Neg Hx   . Hypotension Neg Hx   . Malignant hyperthermia Neg Hx   . Pseudochol deficiency Neg Hx     Prior to Admission medications   Medication Sig Start Date End Date Taking? Authorizing Provider  calcium carbonate (TUMS EX) 750 MG chewable tablet Chew 2 tablets by mouth daily.    Yes [provider]  Cholecalciferol (VITAMIN D PO) Take 1 tablet by mouth daily.   Yes [provider]  lovastatin (MEVACOR) 40 MG tablet TAKE (1) TABLET BY  MOUTH AT BEDTIME. 04/11/16  Yes Fayrene Helper, MD  Melatonin 300 MCG TABS Take 1 tablet by mouth at bedtime.   Yes [provider]  omeprazole (PRILOSEC) 20 MG capsule TAKE (1) CAPSULE BY MOUTH AT BEDTIME. 07/14/16  Yes Fayrene Helper, MD  ranitidine (ZANTAC) 150 MG tablet TAKE (1) TABLET BY MOUTH TWICE DAILY. 07/14/16  Yes Fayrene Helper, MD    Physical Exam: Vitals:   12/17/16 1110 12/17/16 1318 12/17/16 1559  BP:  (!) 159/78 (!) 150/85  Pulse:  (!) 55 76  Resp:  16 (!) 22  Temp:   98.1 F (36.7 C)  TempSrc:   Oral  SpO2:  100% 96%  Weight: 45.4 kg (100 lb)  44.5 kg (98 lb)  Height: 5' (1.524 m)  5' (1.524 m)    Constitutional: NAD, calm,  comfortable Vitals:   12/17/16 1110 12/17/16 1318 12/17/16 1559  BP:  (!) 159/78 (!) 150/85  Pulse:  (!) 55 76  Resp:  16 (!) 22  Temp:   98.1 F (36.7 C)  TempSrc:   Oral  SpO2:  100% 96%  Weight: 45.4 kg (100 lb)  44.5 kg (98 lb)  Height: 5' (1.524 m)  5' (1.524 m)   Eyes: PERRL, lids and conjunctivae normal ENMT: Mucous membranes are moist. Posterior pharynx clear of any exudate or lesions.Normal dentition.  Neck: normal, supple, no masses, no thyromegaly Respiratory: clear to auscultation bilaterally, no wheezing, no crackles. Normal respiratory effort. No accessory muscle use.  Cardiovascular: Regular rate and rhythm, no murmurs / rubs / gallops. No extremity edema. 2+ pedal pulses. No carotid bruits.  Abdomen: no tenderness, no masses palpated. No hepatosplenomegaly. Bowel sounds positive.  Musculoskeletal: no clubbing / cyanosis. No joint deformity upper and lower extremities. Good ROM, no contractures. Normal muscle tone.  Skin: no rashes, lesions, ulcers. No induration Neurologic: CN 2-12 grossly intact. Sensation intact, DTR normal. Strength 5/5 in all 4.  Psychiatric: Normal judgment and insight. Alert and oriented x 3. Normal mood.   Labs on Admission: I have personally reviewed following labs and imaging studies  CBC:  Recent Labs Lab 12/17/16 1113  WBC 6.8  HGB 16.4*  HCT 48.9*  MCV 90.7  PLT 989   Basic Metabolic Panel:  Recent Labs Lab 12/17/16 1113  NA 145  K 3.8  CL 108  CO2 26  GLUCOSE 116*  BUN 18  CREATININE 1.15*  CALCIUM 10.1   GFR: Estimated Creatinine Clearance: 21.5 mL/min (A) (by C-G formula based on SCr of 1.15 mg/dL (H)). Liver Function Tests: No results for input(s): AST, ALT, ALKPHOS, BILITOT, PROT, ALBUMIN in the last 168 hours. No results for input(s): LIPASE, AMYLASE in the last 168 hours. No results for input(s): AMMONIA in the last 168 hours. Coagulation Profile: No results for input(s): INR, PROTIME in the last 168  hours. Cardiac Enzymes: No results for input(s): CKTOTAL, CKMB, CKMBINDEX, TROPONINI in the last 168 hours. BNP (last 3 results) No results for input(s): PROBNP in the last 8760 hours. HbA1C: No results for input(s): HGBA1C in the last 72 hours. CBG:  Recent Labs Lab 12/17/16 1141  GLUCAP 112*   Lipid Profile: No results for input(s): CHOL, HDL, LDLCALC, TRIG, CHOLHDL, LDLDIRECT in the last 72 hours. Thyroid Function Tests: No results for input(s): TSH, T4TOTAL, FREET4, T3FREE, THYROIDAB in the last 72 hours. Anemia Panel: No results for input(s): VITAMINB12, FOLATE, FERRITIN, TIBC, IRON, RETICCTPCT in the last 72 hours. Urine analysis:    Component  Value Date/Time   COLORURINE STRAW (A) 12/17/2016 1225   APPEARANCEUR CLEAR 12/17/2016 1225   LABSPEC 1.004 (L) 12/17/2016 1225   PHURINE 8.0 12/17/2016 1225   GLUCOSEU NEGATIVE 12/17/2016 1225   HGBUR SMALL (A) 12/17/2016 1225   HGBUR negative 09/17/2008 0936   BILIRUBINUR NEGATIVE 12/17/2016 1225   BILIRUBINUR small 06/07/2015 1648   KETONESUR NEGATIVE 12/17/2016 1225   PROTEINUR NEGATIVE 12/17/2016 1225   UROBILINOGEN 0.2 06/07/2015 1648   UROBILINOGEN 0.2 02/22/2015 1911   NITRITE NEGATIVE 12/17/2016 1225   LEUKOCYTESUR NEGATIVE 12/17/2016 1225    Radiological Exams on Admission: Dg Chest 1 View  Result Date: 12/17/2016 CLINICAL DATA:  Dizziness for several hours. EXAM: CHEST 1 VIEW COMPARISON:  Chest radiograph 01/02/2014.  Abdominal CT 11/24/2015. FINDINGS: 1148 hours. Stable cardiomegaly, aortic atherosclerosis and moderate size hiatal hernia. The lungs are clear. There is no pleural effusion or pneumothorax. There is posttraumatic deformity of the left humeral head from previous fracture. No acute osseous findings are evident. Surgical clips are present in the right upper quadrant of the abdomen. IMPRESSION: No acute cardiopulmonary process. Cardiomegaly and moderate hiatal hernia. Electronically Signed   By: Richardean Sale M.D.   On: 12/17/2016 12:10   Ct Head Wo Contrast  Result Date: 12/17/2016 CLINICAL DATA:  Complains of dizziness. EXAM: CT HEAD WITHOUT CONTRAST TECHNIQUE: Contiguous axial images were obtained from the base of the skull through the vertex without intravenous contrast. COMPARISON:  06/25/2015 FINDINGS: Brain: Patchy areas of low attenuation throughout the subcortical and periventricular white matter identified compatible with chronic microvascular disease. There is prominence of the sulci and ventricles compatible with brain atrophy. Vascular: No hyperdense vessel or unexpected calcification. Skull: The calvarium appears intact. Sinuses/Orbits: Chronic deformity involving the anterior wall of the left maxillary sinus identified. The sinuses are otherwise clear. Other: None IMPRESSION: 1. No acute intracranial abnormalities. 2. Chronic small vessel ischemic disease and brain atrophy. Electronically Signed   By: Kerby Moors M.D.   On: 12/17/2016 12:04    EKG: Independently reviewed. Sinus rhythm without acute changes.  Assessment/Plan Active Problems:   Hyperlipemia   GERD   CKD (chronic kidney disease) stage 3, GFR 30-59 ml/min   Vertigo   HTN (hypertension)     1. Dizziness/vertigo. Possibly related to dehydration as well as positional vertigo. Continue the patient on IV hydration overnight. Continue on meclizine. Will get physical therapy evaluation in a.m. to assess vestibular apparatus. If her dizziness persists despite hydration, will need to consider MRI brain to rule out posterior circulation CVA. 2. GERD. Continue on PPI and H2 blockers 3. Hypertension. She does not appear to be on any antihypertensives. Will start on when necessary hydralazine for now. 4. Chronic kidney disease stage III. Creatinine is at baseline at 1.1. 5. Hyperlipidemia. Continue statin  DVT prophylaxis: Lovenox Code Status: Full code Family Communication: Discussed with daughter at the  bedside Disposition Plan: Discharge home once improved Consults called:  Admission status: Observation   Jaonna Word MD [facility] Pager [phone]  If 7PM-7AM, please contact night-coverage [email or web address] Password [facility]  12/17/2016, 4:34 PM

## 2016-12-18 ENCOUNTER — Observation Stay (HOSPITAL_COMMUNITY): Payer: Medicare Other

## 2016-12-18 DIAGNOSIS — E785 Hyperlipidemia, unspecified: Secondary | ICD-10-CM | POA: Diagnosis not present

## 2016-12-18 DIAGNOSIS — I10 Essential (primary) hypertension: Secondary | ICD-10-CM | POA: Diagnosis not present

## 2016-12-18 DIAGNOSIS — M6281 Muscle weakness (generalized): Secondary | ICD-10-CM | POA: Diagnosis not present

## 2016-12-18 DIAGNOSIS — N183 Chronic kidney disease, stage 3 (moderate): Secondary | ICD-10-CM | POA: Diagnosis not present

## 2016-12-18 DIAGNOSIS — R42 Dizziness and giddiness: Secondary | ICD-10-CM | POA: Diagnosis not present

## 2016-12-18 LAB — BASIC METABOLIC PANEL
Anion gap: 10 (ref 5–15)
BUN: 15 mg/dL (ref 6–20)
CHLORIDE: 111 mmol/L (ref 101–111)
CO2: 20 mmol/L — AB (ref 22–32)
CREATININE: 1.03 mg/dL — AB (ref 0.44–1.00)
Calcium: 8.9 mg/dL (ref 8.9–10.3)
GFR calc non Af Amer: 45 mL/min — ABNORMAL LOW (ref >60)
GFR, EST AFRICAN AMERICAN: 53 mL/min — AB (ref >60)
Glucose, Bld: 104 mg/dL — ABNORMAL HIGH (ref 65–99)
POTASSIUM: 4.3 mmol/L (ref 3.5–5.1)
Sodium: 141 mmol/L (ref 135–145)

## 2016-12-18 LAB — CBC
HEMATOCRIT: 41.4 % (ref 36.0–46.0)
HEMOGLOBIN: 13.7 g/dL (ref 12.0–15.0)
MCH: 30 pg (ref 26.0–34.0)
MCHC: 33.1 g/dL (ref 30.0–36.0)
MCV: 90.8 fL (ref 78.0–100.0)
PLATELETS: 220 10*3/uL (ref 150–400)
RBC: 4.56 MIL/uL (ref 3.87–5.11)
RDW: 14 % (ref 11.5–15.5)
WBC: 8 10*3/uL (ref 4.0–10.5)

## 2016-12-18 MED ORDER — METOCLOPRAMIDE HCL 5 MG/ML IJ SOLN
5.0000 mg | Freq: Four times a day (QID) | INTRAMUSCULAR | Status: DC
  Administered 2016-12-18 – 2016-12-19 (×3): 5 mg via INTRAVENOUS
  Filled 2016-12-18 (×4): qty 2

## 2016-12-18 MED ORDER — ONDANSETRON HCL 4 MG/2ML IJ SOLN
4.0000 mg | Freq: Once | INTRAMUSCULAR | Status: AC
  Administered 2016-12-18: 4 mg via INTRAVENOUS
  Filled 2016-12-18: qty 2

## 2016-12-18 MED ORDER — ONDANSETRON HCL 4 MG/2ML IJ SOLN: 4.0000 mg | Freq: Four times a day (QID) | INTRAMUSCULAR | Status: DC

## 2016-12-18 MED ORDER — LISINOPRIL 10 MG PO TABS
10.0000 mg | ORAL_TABLET | Freq: Every day | ORAL | Status: DC
  Administered 2016-12-18 – 2016-12-19 (×2): 10 mg via ORAL
  Filled 2016-12-18 (×2): qty 1

## 2016-12-18 MED ORDER — POLYVINYL ALCOHOL 1.4 % OP SOLN
1.0000 [drp] | OPHTHALMIC | Status: DC | PRN
  Administered 2016-12-18: 1 [drp] via OPHTHALMIC
  Filled 2016-12-18: qty 15

## 2016-12-18 MED ORDER — LORAZEPAM 0.5 MG PO TABS
0.2500 mg | ORAL_TABLET | Freq: Two times a day (BID) | ORAL | Status: DC
  Administered 2016-12-18 – 2016-12-19 (×2): 0.25 mg via ORAL
  Filled 2016-12-18 (×2): qty 1

## 2016-12-18 NOTE — Evaluation (Signed)
Physical Therapy Evaluation Patient Details Name: SEE BEHARRY MRN: [id number] DOB: 12-Jun-1920 Today's Date: 12/18/2016   History of Present Illness  Kristy Jarvis is a 83yo white female who comes to [facility] on 7/29 c sustained dizziness, nausea, associated with standing, AMB, and head movements. She reports poor tolerance to watching TV as it aggravates her dizziness. PMH: Lt hip ORIF (2015)., HLD, HTN. At baseline pt lives alone, amb limited community distances with QC, no falls history in past 6 months. Daughter at bedside reports patient has had episodes similar to this nearly every month over the past 6 months. She adds that patient chronically has had episodic vertigo and nasuea to a higher degree over the years, but feels as though this is much different and symptoms fluctuate.   Clinical Impression  Pt admitted with above diagnosis. Pt currently with functional limitations due to the deficits listed below (see "PT Problem List"). Upon entry, the patient is received semirecumbent in bed, daughter present.The pt is awake and agreeable to participate. No acute distress noted at this time, pt reporting no dizziness in supine. Functional mobility assessment demonstrates marked increase in dizziness, nausea, and vertigo with supine-> sitting EOB with noted right beating nystagmus at end range right occular gaze (absent in supine), initially resolving by 50% after 60s seated, but progressively worsening during gait trial to doorway. Symptom improve with return to supine. Orthostatics are negative, 189/97 mmHg standing. Fine motor coordination appears to be WNL AEB serial digital opposition bilat. Pt demonstrates dysmetria with Left sided finger to nose and Left sided finger to distal targets, with intentional tremor, RUE is WNL. Tremulous movement of the tongue is noted throughout, but daughter reports this as baseline. Pt denies any acute to sub-acute changes to hearing, tinnitus, etc.Gait to door  way and back to bed is progressively more provocative with wide based gait and eventual inability to maintain balance without physical assistance. Pt will benefit from skilled PT intervention to increase independence and safety with basic mobility in preparation for discharge to the venue listed below.       Follow Up Recommendations Outpatient PT    Equipment Recommendations  Rolling walker with 5" wheels    Recommendations for Other Services       Precautions / Restrictions Precautions Precaution Comments: no score available  Restrictions Weight Bearing Restrictions: No      Mobility  Bed Mobility Overal bed mobility: Modified Independent             General bed mobility comments: increased dizziness upon sitting, Right beating nystagmus at end range Right Gaze, resolves by 50% within 60sec, but nausea remains  Transfers Overall transfer level: Needs assistance Equipment used: Quad cane Transfers: Sit to/from Stand Sit to Stand: Supervision         General transfer comment: increased dizziness and nausea, reports of room spinning worsenign shortly after standing, progressively worse with AMB   Ambulation/Gait Ambulation/Gait assistance: Min assist;Mod assist Ambulation Distance (Feet): 20 Feet Assistive device: Quad cane       General Gait Details: bed to doorway requires minGuard assist, but return trip is with greater ataxia, falls anxiety, and instability, req physical assist avoid LOB to floor   Stairs            Wheelchair Mobility    Modified Rankin (Stroke Patients Only)       Balance Overall balance assessment: Needs assistance         Standing balance support: During functional activity;Single extremity supported  Standing balance-Leahy Scale: Zero                               Pertinent Vitals/Pain Pain Assessment: No/denies pain    Home Living Family/patient expects to be discharged to:: Private residence Living  Arrangements: Alone Available Help at Discharge: Family Type of Home: House Home Access: Stairs to enter Entrance Stairs-Rails: Right;Left;Can reach both Technical brewer of Steps: 3 Home Layout: One level Home Equipment: Cane - quad      Prior Function Level of Independence: Independent with assistive device(s)         Comments: daughter assists with transportation, groceries, etc. No specific limitations with accessing community distances, but she does not go out frequently      Hand Dominance   Dominant Hand: Right    Extremity/Trunk Assessment   Upper Extremity Assessment Upper Extremity Assessment: LUE deficits/detail LUE Deficits / Details: difficulty with finger to nose and distal end-range targets as well; serial digital opposition is within normal limits.     Lower Extremity Assessment Lower Extremity Assessment: Generalized weakness;Overall [facility] for tasks assessed    Cervical / Trunk Assessment Cervical / Trunk Assessment: Normal  Communication   Communication: No difficulties  Cognition Arousal/Alertness: Awake/alert Behavior During Therapy: WFL for tasks assessed/performed Overall Cognitive Status: Within Functional Limits for tasks assessed                                        General Comments      Exercises     Assessment/Plan    PT Assessment Patient needs continued PT services  PT Problem List Decreased mobility;Decreased coordination;Cardiopulmonary status limiting activity;Decreased balance;Decreased activity tolerance       PT Treatment Interventions DME instruction;Gait training;Stair training;Functional mobility training;Therapeutic activities;Therapeutic exercise;Balance training;Neuromuscular re-education    PT Goals (Current goals can be found in the Care Plan section)  Acute Rehab PT Goals Patient Stated Goal: decrease dizziness  PT Goal Formulation: With patient Time For Goal Achievement:  01/01/17 Potential to Achieve Goals: Fair    Frequency Min 2X/week   Barriers to discharge Decreased caregiver support      Co-evaluation               AM-PAC PT "6 Clicks" Daily Activity  Outcome Measure Difficulty turning over in bed (including adjusting bedclothes, sheets and blankets)?: A Lot Difficulty moving from lying on back to sitting on the side of the bed? : A Lot Difficulty sitting down on and standing up from a chair with arms (e.g., wheelchair, bedside commode, etc,.)?: A Lot Help needed moving to and from a bed to chair (including a wheelchair)?: A Lot Help needed walking in hospital room?: Total Help needed climbing 3-5 steps with a railing? : Total 6 Click Score: 10    End of Session Equipment Utilized During Treatment: Gait belt Activity Tolerance: Treatment limited secondary to medical complications (Comment) (worsening nausea, ataxia, and vertigo with activity) Patient left: in bed Nurse Communication: Mobility status PT Visit Diagnosis: Unsteadiness on feet (R26.81);Muscle weakness (generalized) (M62.81);Other abnormalities of gait and mobility (R26.89)    Time: 2683-4196 PT Time Calculation (min) (ACUTE ONLY): 28 min   Charges:   PT Evaluation $PT Eval Moderate Complexity: 1 Mod     PT G Codes:   PT G-Codes **NOT FOR INPATIENT CLASS** Functional Assessment Tool Used: AM-PAC 6  Clicks Basic Mobility;Clinical judgement Functional Limitation: Changing and maintaining body position Changing and Maintaining Body Position Current Status [phone]): At least 60 percent but less than 80 percent impaired, limited or restricted Changing and Maintaining Body Position Goal Status (V1427): At least 60 percent but less than 80 percent impaired, limited or restricted    12:07 PM, 12/18/16 Etta Grandchild, PT, DPT Physical Therapist - [place] [phone] (Office)    Selin Eisler C 12/18/2016, 11:56 AM

## 2016-12-18 NOTE — Care Management Obs Status (Signed)
[place] NOTIFICATION   Patient Details  Name: Kristy Jarvis MRN: [id number] Date of Birth: [birth year]   Medicare Observation Status Notification Given:  Yes    Sherald Barge, RN 12/18/2016, 12:13 PM

## 2016-12-18 NOTE — Care Management Note (Signed)
Case Management Note  Patient Details  Name: Kristy Jarvis MRN: [id number] Date of Birth: Nov 07, 1920  Subjective/Objective:                  Admitted with vertigo. Pt is from home, lives alone and has daughter who is very supportive. Pt is ind with ADL's pta. She uses a cane with ambulation pta. PT has recommended OP PT and RW. Pt and daughter requests HH due to pts vertigo it makes it difficult for pt to leave the home. They request [facility] for DME and [place] services. They are aware [place] has 48hrs to make first visit.   Action/Plan: Plan for DC home with [place] and RW. [facility] rep, Vaughan Basta, aware of referral and will obtain pt info from chart and deliver DME to pt room prior to DC. MD made aware [place] orders need to be placed prior to DC. Potential for DC home today or tomorrow.   Expected Discharge Date:     12/18/2016             Expected Discharge Plan:  [place]  In-House Referral:  NA  Discharge planning Services  CM Consult  Post Acute Care Choice:  Home Health, Durable Medical Equipment Choice offered to:  Patient, Adult Children  DME Arranged:  [place] rolling DME Agency:  [place]:  PT [place]:  [place]  Status of Service:  In process, will continue to follow  Sherald Barge, RN 12/18/2016, 1:55 PM

## 2016-12-18 NOTE — Progress Notes (Signed)
PROGRESS NOTE    Kristy Jarvis  VQM:[id number] DOB: [birth year] DOA: 12/17/2016 PCP: Fayrene Helper, MD    Brief Narrative:  83 year old female admitted with intractable dizziness and vertigo. She has associated nausea. She is unable to stand. Symptoms started approximately 3 days prior to admission. MRI is negative for stroke. She is undergoing further treatments for symptom management. Discharge home when she is able to ambulate.   Assessment & Plan:   Active Problems:   Hyperlipemia   GERD   CKD (chronic kidney disease) stage 3, GFR 30-59 ml/min   Vertigo   HTN (hypertension)   Dizziness   1. Dizziness/vertigo. Patient's symptoms have persisted since admission. MRI brain was negative for acute infarct. I suspect that her symptoms may be related to more peripheral vertigo. She is on meclizine. Will add Reglan. Also add low-dose Ativan. At this point her vertigo is severe enough that she is unable to walk. We will continue these treatments and reevaluate in a.m . 2. GERD. Continue on PPI and H2 blockers 3. Hypertension. Blood pressures remain elevated. Started on lisinopril. Continue when necessary hydralazine 4. Chronic kidney disease stage III. Creatinine is at baseline at 1.1. 5. Hyperlipidemia. Continue statin   DVT prophylaxis: lovenox Code Status: full code Family Communication: discussed with daughter Disposition Plan: discharge home once improved   Consultants:     Procedures:     Antimicrobials:      Subjective: Continues to have dizziness and is unable to walk.  Objective: Vitals:   12/17/16 1930 12/17/16 2111 12/18/16 0531 12/18/16 1400  BP: (!) 155/78 (!) 148/64 (!) 158/56 (!) 145/48  Pulse: 78 85 81 78  Resp:  20 20 18   Temp:  98.8 F (37.1 C) 98.7 F (37.1 C) 98.4 F (36.9 C)  TempSrc:  Oral Oral Oral  SpO2:  96% 94% 95%  Weight:      Height:        Intake/Output Summary (Last 24 hours) at 12/18/16 1740 Last data filed at  12/18/16 1700  Gross per 24 hour  Intake          1738.33 ml  Output             1400 ml  Net           338.33 ml   Filed Weights   12/17/16 1110 12/17/16 1559  Weight: 45.4 kg (100 lb) 44.5 kg (98 lb)    Examination:  General exam: Appears calm and comfortable  Respiratory system: Clear to auscultation. Respiratory effort normal. Cardiovascular system: S1 & S2 heard, RRR. No JVD, murmurs, rubs, gallops or clicks. No pedal edema. Gastrointestinal system: Abdomen is nondistended, soft and nontender. No organomegaly or masses felt. Normal bowel sounds heard. Central nervous system: Alert and oriented. No focal neurological deficits. Extremities: Symmetric 5 x 5 power. Skin: No rashes, lesions or ulcers Psychiatry: Judgement and insight appear normal. Mood & affect appropriate.     Data Reviewed: I have personally reviewed following labs and imaging studies  CBC:  Recent Labs Lab 12/17/16 1113 12/18/16 0627  WBC 6.8 8.0  HGB 16.4* 13.7  HCT 48.9* 41.4  MCV 90.7 90.8  PLT 225 932   Basic Metabolic Panel:  Recent Labs Lab 12/17/16 1113 12/18/16 0627  NA 145 141  K 3.8 4.3  CL 108 111  CO2 26 20*  GLUCOSE 116* 104*  BUN 18 15  CREATININE 1.15* 1.03*  CALCIUM 10.1 8.9   GFR: Estimated Creatinine Clearance: 24 mL/min (  A) (by C-G formula based on SCr of 1.03 mg/dL (H)). Liver Function Tests: No results for input(s): AST, ALT, ALKPHOS, BILITOT, PROT, ALBUMIN in the last 168 hours. No results for input(s): LIPASE, AMYLASE in the last 168 hours. No results for input(s): AMMONIA in the last 168 hours. Coagulation Profile: No results for input(s): INR, PROTIME in the last 168 hours. Cardiac Enzymes: No results for input(s): CKTOTAL, CKMB, CKMBINDEX, TROPONINI in the last 168 hours. BNP (last 3 results) No results for input(s): PROBNP in the last 8760 hours. HbA1C: No results for input(s): HGBA1C in the last 72 hours. CBG:  Recent Labs Lab 12/17/16 1141    GLUCAP 112*   Lipid Profile: No results for input(s): CHOL, HDL, LDLCALC, TRIG, CHOLHDL, LDLDIRECT in the last 72 hours. Thyroid Function Tests: No results for input(s): TSH, T4TOTAL, FREET4, T3FREE, THYROIDAB in the last 72 hours. Anemia Panel: No results for input(s): VITAMINB12, FOLATE, FERRITIN, TIBC, IRON, RETICCTPCT in the last 72 hours. Sepsis Labs: No results for input(s): PROCALCITON, LATICACIDVEN in the last 168 hours.  No results found for this or any previous visit (from the past 240 hour(s)).       Radiology Studies: Dg Chest 1 View  Result Date: 12/17/2016 CLINICAL DATA:  Dizziness for several hours. EXAM: CHEST 1 VIEW COMPARISON:  Chest radiograph 01/02/2014.  Abdominal CT 11/24/2015. FINDINGS: 1148 hours. Stable cardiomegaly, aortic atherosclerosis and moderate size hiatal hernia. The lungs are clear. There is no pleural effusion or pneumothorax. There is posttraumatic deformity of the left humeral head from previous fracture. No acute osseous findings are evident. Surgical clips are present in the right upper quadrant of the abdomen. IMPRESSION: No acute cardiopulmonary process. Cardiomegaly and moderate hiatal hernia. Electronically Signed   By: Richardean Sale M.D.   On: 12/17/2016 12:10   Ct Head Wo Contrast  Result Date: 12/17/2016 CLINICAL DATA:  Complains of dizziness. EXAM: CT HEAD WITHOUT CONTRAST TECHNIQUE: Contiguous axial images were obtained from the base of the skull through the vertex without intravenous contrast. COMPARISON:  06/25/2015 FINDINGS: Brain: Patchy areas of low attenuation throughout the subcortical and periventricular white matter identified compatible with chronic microvascular disease. There is prominence of the sulci and ventricles compatible with brain atrophy. Vascular: No hyperdense vessel or unexpected calcification. Skull: The calvarium appears intact. Sinuses/Orbits: Chronic deformity involving the anterior wall of the left maxillary  sinus identified. The sinuses are otherwise clear. Other: None IMPRESSION: 1. No acute intracranial abnormalities. 2. Chronic small vessel ischemic disease and brain atrophy. Electronically Signed   By: Kerby Moors M.D.   On: 12/17/2016 12:04   Mr Brain Wo Contrast  Result Date: 12/18/2016 CLINICAL DATA:  Vertigo EXAM: MRI HEAD WITHOUT CONTRAST TECHNIQUE: Multiplanar, multiecho pulse sequences of the brain and surrounding structures were obtained without intravenous contrast. COMPARISON:  CT head 12/17/2016, MRI head 06/25/2015 FINDINGS: Brain: Moderately severe atrophy. Negative for acute infarct. Mild chronic microvascular ischemic change in the white matter. Negative for hemorrhage mass or edema. No fluid collection or shift of the midline structures Vascular: Normal arterial flow void Skull and upper cervical spine: Negative Sinuses/Orbits: Negative Other: None IMPRESSION: No acute intracranial abnormality. Atrophy and chronic microvascular ischemic change in the white matter. Stable since 2017. Electronically Signed   By: Franchot Gallo M.D.   On: 12/18/2016 13:44        Scheduled Meds: . calcium carbonate  2 tablet Oral Daily  . enoxaparin (LOVENOX) injection  30 mg Subcutaneous Q24H  . famotidine  20  mg Oral BID  . lisinopril  10 mg Oral Daily  . LORazepam  0.25 mg Oral BID  . meclizine  25 mg Oral TID  . metoCLOPramide (REGLAN) injection  5 mg Intravenous Q6H  . pantoprazole  40 mg Oral QHS  . pravastatin  40 mg Oral q1800   Continuous Infusions: . 0.9 % NaCl with KCl 20 mEq / L 100 mL/hr at 12/18/16 1702     LOS: 0 days    Time spent: 62mins    MEMON,JEHANZEB, MD [facility] Pager [phone]  If 7PM-7AM, please contact night-coverage [email or web address] Password [email or web address] 12/18/2016, 5:40 PM

## 2016-12-19 ENCOUNTER — Telehealth: Payer: Self-pay | Admitting: Family Medicine

## 2016-12-19 ENCOUNTER — Other Ambulatory Visit: Payer: Self-pay | Admitting: Family Medicine

## 2016-12-19 DIAGNOSIS — R42 Dizziness and giddiness: Secondary | ICD-10-CM | POA: Diagnosis not present

## 2016-12-19 DIAGNOSIS — I10 Essential (primary) hypertension: Secondary | ICD-10-CM | POA: Diagnosis not present

## 2016-12-19 DIAGNOSIS — E785 Hyperlipidemia, unspecified: Secondary | ICD-10-CM | POA: Diagnosis not present

## 2016-12-19 DIAGNOSIS — N183 Chronic kidney disease, stage 3 (moderate): Secondary | ICD-10-CM | POA: Diagnosis not present

## 2016-12-19 MED ORDER — MECLIZINE HCL 25 MG PO TABS
25.0000 mg | ORAL_TABLET | Freq: Three times a day (TID) | ORAL | 0 refills | Status: DC
Start: 2016-12-19 — End: 2017-01-23

## 2016-12-19 MED ORDER — METOCLOPRAMIDE HCL 5 MG PO TABS: 5.0000 mg | ORAL_TABLET | Freq: Three times a day (TID) | ORAL | 1 refills | Status: DC | PRN

## 2016-12-19 MED ORDER — LISINOPRIL 10 MG PO TABS: 10.0000 mg | ORAL_TABLET | Freq: Every day | ORAL | 1 refills | Status: DC

## 2016-12-19 NOTE — Care Management Note (Signed)
Case Management Note  Patient Details  Name: Kristy Jarvis MRN: [id number] Date of Birth: Mar 31, 1921   Expected Discharge Date:  12/19/16               Expected Discharge Plan:  [place]  In-House Referral:  NA  Discharge planning Services  CM Consult  Post Acute Care Choice:  Home Health, Durable Medical Equipment Choice offered to:  Patient, Adult Children  DME Arranged:  Walker rolling DME Agency:  [place] Arranged:  PT [facility] Agency:  [place]  Status of Service:  Completed, signed off  Additional Comments: Discharged home today. [facility] rep, Vaughan Basta, made aware.   Sherald Barge, RN 12/19/2016, 1:45 PM

## 2016-12-19 NOTE — Telephone Encounter (Signed)
Please get urgent eNT appt for this pt recently hospitalized with vertigo, when you speak with her about appt , let her know I am concerned, she should also have appt for f/u with me next Tuesday, I asked Chelsea to schedule that one

## 2016-12-19 NOTE — Progress Notes (Signed)
Discharge instructions read to patient and her family,  Both verbalized understanding of instructions. Discharged to home with family

## 2016-12-19 NOTE — Telephone Encounter (Signed)
Patient has appt with you Tuesday Aug 7th appt with Dr. Benjamine Mola for Vertigo this Thursday Aug 2nd @ 4pm Rodena Piety (daughter) is aware of appt date and time for both appts.

## 2016-12-19 NOTE — Discharge Summary (Signed)
Physician Discharge Summary  SOLE LENGACHER FGH:[id number] DOB: 08-13-1920 DOA: 12/17/2016  PCP: Fayrene Helper, MD  Admit date: 12/17/2016 Discharge date: 12/19/2016  Admitted From: home Disposition:  home  Recommendations for Outpatient Follow-up:  1. Follow up with PCP in 1-2 weeks 2. Please obtain BMP/CBC in one week 3. Patient started on lisinopril for HTN 4. Follow up with ENT for vertigo  Home Health:HHPT Equipment/Devices:  Discharge Condition: stable CODE STATUS: full Diet recommendation: Heart Healthy   Brief/Interim Summary: 83 year old female admitted with intractable dizziness and vertigo. She has associated nausea. She is unable to stand. Symptoms started approximately 3 days prior to admission. MRI is negative for stroke. She is undergoing further treatments for symptom management.   Discharge Diagnoses:  Active Problems:   Hyperlipemia   GERD   CKD (chronic kidney disease) stage 3, GFR 30-59 ml/min   Vertigo   HTN (hypertension)   Dizziness  1. Dizziness/vertigo. MRI brain was negative for acute infarct. I suspect that her symptoms may be related to more peripheral vertigo. She is on meclizine and Reglan with improvement of symptoms. Follow up with ENT for further management. 2. GERD. Continue on PPI and H2 blockers 3. Hypertension. Blood pressure improved with lisinopril.  4. Chronic kidney disease stage III. Creatinine is at baseline at 1.1. 5. Hyperlipidemia. Continue statin  Discharge Instructions  Discharge Instructions    Diet - low sodium heart healthy    Complete by:  As directed    Increase activity slowly    Complete by:  As directed      Allergies as of 12/19/2016      Reactions   Alendronate Sodium Other (See Comments)   Chest Pain, Fatigue    Penicillins Rash         Medication List    TAKE these medications   calcium carbonate 750 MG chewable tablet Commonly known as:  TUMS EX Chew 2 tablets by mouth daily.    lisinopril 10 MG tablet Commonly known as:  PRINIVIL,ZESTRIL Take 1 tablet (10 mg total) by mouth daily.   lovastatin 40 MG tablet Commonly known as:  MEVACOR TAKE (1) TABLET BY MOUTH AT BEDTIME.   meclizine 25 MG tablet Commonly known as:  ANTIVERT Take 1 tablet (25 mg total) by mouth 3 (three) times daily.   Melatonin 300 MCG Tabs Take 1 tablet by mouth at bedtime.   metoCLOPramide 5 MG tablet Commonly known as:  REGLAN Take 1 tablet (5 mg total) by mouth every 8 (eight) hours as needed for nausea.   omeprazole 20 MG capsule Commonly known as:  PRILOSEC TAKE (1) CAPSULE BY MOUTH AT BEDTIME.   ranitidine 150 MG tablet Commonly known as:  ZANTAC TAKE (1) TABLET BY MOUTH TWICE DAILY.   VITAMIN D PO Take 1 tablet by mouth daily.            Durable Medical Equipment        Start     Ordered   12/18/16 1213  For home use only DME Walker rolling  Once    Question:  Patient needs a walker to treat with the following condition  Answer:  Vertigo   12/18/16 1213     Follow-up Information    [facility].   Contact information: [place] [phone]          Allergies  Allergen Reactions  . Alendronate Sodium Other (See Comments)    Chest Pain, Fatigue   .  Penicillins Rash        Consultations:     Procedures/Studies: Dg Chest 1 View  Result Date: 12/17/2016 CLINICAL DATA:  Dizziness for several hours. EXAM: CHEST 1 VIEW COMPARISON:  Chest radiograph 01/02/2014.  Abdominal CT 11/24/2015. FINDINGS: 1148 hours. Stable cardiomegaly, aortic atherosclerosis and moderate size hiatal hernia. The lungs are clear. There is no pleural effusion or pneumothorax. There is posttraumatic deformity of the left humeral head from previous fracture. No acute osseous findings are evident. Surgical clips are present in the right upper quadrant of the abdomen. IMPRESSION: No acute cardiopulmonary process.  Cardiomegaly and moderate hiatal hernia. Electronically Signed   By: Richardean Sale M.D.   On: 12/17/2016 12:10   Ct Head Wo Contrast  Result Date: 12/17/2016 CLINICAL DATA:  Complains of dizziness. EXAM: CT HEAD WITHOUT CONTRAST TECHNIQUE: Contiguous axial images were obtained from the base of the skull through the vertex without intravenous contrast. COMPARISON:  06/25/2015 FINDINGS: Brain: Patchy areas of low attenuation throughout the subcortical and periventricular white matter identified compatible with chronic microvascular disease. There is prominence of the sulci and ventricles compatible with brain atrophy. Vascular: No hyperdense vessel or unexpected calcification. Skull: The calvarium appears intact. Sinuses/Orbits: Chronic deformity involving the anterior wall of the left maxillary sinus identified. The sinuses are otherwise clear. Other: None IMPRESSION: 1. No acute intracranial abnormalities. 2. Chronic small vessel ischemic disease and brain atrophy. Electronically Signed   By: Kerby Moors M.D.   On: 12/17/2016 12:04   Mr Brain Wo Contrast  Result Date: 12/18/2016 CLINICAL DATA:  Vertigo EXAM: MRI HEAD WITHOUT CONTRAST TECHNIQUE: Multiplanar, multiecho pulse sequences of the brain and surrounding structures were obtained without intravenous contrast. COMPARISON:  CT head 12/17/2016, MRI head 06/25/2015 FINDINGS: Brain: Moderately severe atrophy. Negative for acute infarct. Mild chronic microvascular ischemic change in the white matter. Negative for hemorrhage mass or edema. No fluid collection or shift of the midline structures Vascular: Normal arterial flow void Skull and upper cervical spine: Negative Sinuses/Orbits: Negative Other: None IMPRESSION: No acute intracranial abnormality. Atrophy and chronic microvascular ischemic change in the white matter. Stable since 2017. Electronically Signed   By: Franchot Gallo M.D.   On: 12/18/2016 13:44      Subjective: Dizziness improving.  Nausea improving  Discharge Exam: Vitals:   12/18/16 1400 12/18/16 2142  BP: (!) 145/48 (!) 142/54  Pulse: 78 75  Resp: 18 18  Temp: 98.4 F (36.9 C) 98.3 F (36.8 C)   Vitals:   12/17/16 2111 12/18/16 0531 12/18/16 1400 12/18/16 2142  BP: (!) 148/64 (!) 158/56 (!) 145/48 (!) 142/54  Pulse: 85 81 78 75  Resp: 20 20 18 18   Temp: 98.8 F (37.1 C) 98.7 F (37.1 C) 98.4 F (36.9 C) 98.3 F (36.8 C)  TempSrc: Oral Oral Oral Oral  SpO2: 96% 94% 95% 94%  Weight:      Height:        General: Pt is alert, awake, not in acute distress Cardiovascular: RRR, S1/S2 +, no rubs, no gallops Respiratory: CTA bilaterally, no wheezing, no rhonchi Abdominal: Soft, NT, ND, bowel sounds + Extremities: no edema, no cyanosis    The results of significant diagnostics from this hospitalization (including imaging, microbiology, ancillary and laboratory) are listed below for reference.     Microbiology: No results found for this or any previous visit (from the past 240 hour(s)).   Labs: BNP (last 3 results) No results for input(s): BNP in the last 8760 hours. Basic Metabolic  Panel:  Recent Labs Lab 12/17/16 1113 12/18/16 0627  NA 145 141  K 3.8 4.3  CL 108 111  CO2 26 20*  GLUCOSE 116* 104*  BUN 18 15  CREATININE 1.15* 1.03*  CALCIUM 10.1 8.9   Liver Function Tests: No results for input(s): AST, ALT, ALKPHOS, BILITOT, PROT, ALBUMIN in the last 168 hours. No results for input(s): LIPASE, AMYLASE in the last 168 hours. No results for input(s): AMMONIA in the last 168 hours. CBC:  Recent Labs Lab 12/17/16 1113 12/18/16 0627  WBC 6.8 8.0  HGB 16.4* 13.7  HCT 48.9* 41.4  MCV 90.7 90.8  PLT 225 220   Cardiac Enzymes: No results for input(s): CKTOTAL, CKMB, CKMBINDEX, TROPONINI in the last 168 hours. BNP: Invalid input(s): POCBNP CBG:  Recent Labs Lab 12/17/16 1141  GLUCAP 112*   D-Dimer No results for input(s): DDIMER in the last 72 hours. Hgb A1c No results for  input(s): HGBA1C in the last 72 hours. Lipid Profile No results for input(s): CHOL, HDL, LDLCALC, TRIG, CHOLHDL, LDLDIRECT in the last 72 hours. Thyroid function studies No results for input(s): TSH, T4TOTAL, T3FREE, THYROIDAB in the last 72 hours.  Invalid input(s): FREET3 Anemia work up No results for input(s): VITAMINB12, FOLATE, FERRITIN, TIBC, IRON, RETICCTPCT in the last 72 hours. Urinalysis    Component Value Date/Time   COLORURINE STRAW (A) 12/17/2016 1225   APPEARANCEUR CLEAR 12/17/2016 1225   LABSPEC 1.004 (L) 12/17/2016 1225   PHURINE 8.0 12/17/2016 1225   GLUCOSEU NEGATIVE 12/17/2016 1225   HGBUR SMALL (A) 12/17/2016 1225   HGBUR negative 09/17/2008 0936   BILIRUBINUR NEGATIVE 12/17/2016 1225   BILIRUBINUR small 06/07/2015 1648   KETONESUR NEGATIVE 12/17/2016 1225   PROTEINUR NEGATIVE 12/17/2016 1225   UROBILINOGEN 0.2 06/07/2015 1648   UROBILINOGEN 0.2 02/22/2015 1911   NITRITE NEGATIVE 12/17/2016 1225   LEUKOCYTESUR NEGATIVE 12/17/2016 1225   Sepsis Labs Invalid input(s): PROCALCITONIN,  WBC,  LACTICIDVEN Microbiology No results found for this or any previous visit (from the past 240 hour(s)).   Time coordinating discharge: Over 30 minutes  SIGNED:   Kathie Dike, MD  [facility] 12/19/2016, 10:50 AM Pager   If 7PM-7AM, please contact night-coverage [email or web address] Password [email or web address]

## 2016-12-20 ENCOUNTER — Telehealth: Payer: Self-pay

## 2016-12-20 ENCOUNTER — Telehealth: Payer: Self-pay | Admitting: Family Medicine

## 2016-12-20 DIAGNOSIS — N183 Chronic kidney disease, stage 3 (moderate): Secondary | ICD-10-CM | POA: Diagnosis not present

## 2016-12-20 DIAGNOSIS — Z7982 Long term (current) use of aspirin: Secondary | ICD-10-CM | POA: Diagnosis not present

## 2016-12-20 DIAGNOSIS — K219 Gastro-esophageal reflux disease without esophagitis: Secondary | ICD-10-CM | POA: Diagnosis not present

## 2016-12-20 DIAGNOSIS — I129 Hypertensive chronic kidney disease with stage 1 through stage 4 chronic kidney disease, or unspecified chronic kidney disease: Secondary | ICD-10-CM | POA: Diagnosis not present

## 2016-12-20 DIAGNOSIS — E785 Hyperlipidemia, unspecified: Secondary | ICD-10-CM | POA: Diagnosis not present

## 2016-12-20 DIAGNOSIS — R42 Dizziness and giddiness: Secondary | ICD-10-CM | POA: Diagnosis not present

## 2016-12-20 NOTE — Telephone Encounter (Signed)
Transition Care Management Follow-up Telephone Call   Date discharged? 12/19/2016   How have you been since you were released from the hospital? Feeling much better is taking it easy. No further dizziness or nausea since she has been home.   Do you understand why you were in the hospital? yes   Do you understand the discharge instructions? yes   Where were you discharged to? Home   Items Reviewed:  Medications reviewed: yes  Allergies reviewed: yes  Dietary changes reviewed: yes  Referrals reviewed: yes   Functional Questionnaire:   Activities of Daily Living (ADLs):   She states they are independent in the following: bathing and hygiene, feeding, continence, grooming, toileting and dressing States they require assistance with the following: ambulation   Any transportation issues/concerns?: no   Any patient concerns? no   Confirmed importance and date/time of follow-up visits scheduled yes  Provider Appointment booked with Dr. Moshe Cipro on 12/26/2016 at 2:20 pm.  Confirmed with patient if condition begins to worsen call PCP or go to the ER.  Patient was given the office number and encouraged to call back with question or concerns.  : yes

## 2016-12-20 NOTE — Telephone Encounter (Signed)
appt is already sched with eNT in am pls hold, I let advanced know

## 2016-12-20 NOTE — Telephone Encounter (Signed)
Ronalee Belts, physical therapist from [facility], called and left message on nurse line regarding patient. He saw patient today, she was referred for vertigo. He wants to see her twice a week for three weeks. He wants to make sure Dr Moshe Cipro will sign these orders.   Callback# [phone]

## 2016-12-21 ENCOUNTER — Ambulatory Visit (INDEPENDENT_AMBULATORY_CARE_PROVIDER_SITE_OTHER): Payer: Medicare Other | Admitting: Otolaryngology

## 2016-12-21 DIAGNOSIS — R42 Dizziness and giddiness: Secondary | ICD-10-CM

## 2016-12-21 DIAGNOSIS — H903 Sensorineural hearing loss, bilateral: Secondary | ICD-10-CM | POA: Diagnosis not present

## 2016-12-22 DIAGNOSIS — Z7982 Long term (current) use of aspirin: Secondary | ICD-10-CM | POA: Diagnosis not present

## 2016-12-22 DIAGNOSIS — R42 Dizziness and giddiness: Secondary | ICD-10-CM | POA: Diagnosis not present

## 2016-12-22 DIAGNOSIS — E785 Hyperlipidemia, unspecified: Secondary | ICD-10-CM | POA: Diagnosis not present

## 2016-12-22 DIAGNOSIS — K219 Gastro-esophageal reflux disease without esophagitis: Secondary | ICD-10-CM | POA: Diagnosis not present

## 2016-12-22 DIAGNOSIS — I129 Hypertensive chronic kidney disease with stage 1 through stage 4 chronic kidney disease, or unspecified chronic kidney disease: Secondary | ICD-10-CM | POA: Diagnosis not present

## 2016-12-22 DIAGNOSIS — N183 Chronic kidney disease, stage 3 (moderate): Secondary | ICD-10-CM | POA: Diagnosis not present

## 2016-12-22 NOTE — Telephone Encounter (Signed)
Ronalee Belts from [facility] called again regarding patient. He states that he calleda few days ago to start PT on patient after hospitalization for vertigo. He spoke to Dr. Moshe Cipro who advised him to wait because patient was to see ENT. Ronalee Belts tells me that per patient and family, ENT did not see any inner ear problems. He is calling to follow up with Dr. Moshe Cipro to see if he can do PT on patient now. He states he can easily do balance exercises and skip the vestibular part. He needs an answer today if possible Callback# [phone]. Ok leave detailed message.

## 2016-12-22 NOTE — Telephone Encounter (Signed)
Twice weekly physical therapy for 3  Weeks , verbal order given

## 2016-12-26 ENCOUNTER — Encounter: Payer: Self-pay | Admitting: Family Medicine

## 2016-12-26 ENCOUNTER — Telehealth: Payer: Self-pay

## 2016-12-26 ENCOUNTER — Ambulatory Visit (INDEPENDENT_AMBULATORY_CARE_PROVIDER_SITE_OTHER): Payer: Medicare Other | Admitting: Family Medicine

## 2016-12-26 VITALS — BP 120/60 | HR 80 | Temp 97.0°F | Resp 16 | Ht 60.0 in | Wt 98.8 lb

## 2016-12-26 DIAGNOSIS — I10 Essential (primary) hypertension: Secondary | ICD-10-CM | POA: Diagnosis not present

## 2016-12-26 DIAGNOSIS — Z09 Encounter for follow-up examination after completed treatment for conditions other than malignant neoplasm: Secondary | ICD-10-CM

## 2016-12-26 DIAGNOSIS — R42 Dizziness and giddiness: Secondary | ICD-10-CM

## 2016-12-26 DIAGNOSIS — N183 Chronic kidney disease, stage 3 unspecified: Secondary | ICD-10-CM

## 2016-12-26 DIAGNOSIS — R0989 Other specified symptoms and signs involving the circulatory and respiratory systems: Secondary | ICD-10-CM

## 2016-12-26 DIAGNOSIS — Z7982 Long term (current) use of aspirin: Secondary | ICD-10-CM | POA: Diagnosis not present

## 2016-12-26 DIAGNOSIS — K219 Gastro-esophageal reflux disease without esophagitis: Secondary | ICD-10-CM | POA: Diagnosis not present

## 2016-12-26 DIAGNOSIS — E785 Hyperlipidemia, unspecified: Secondary | ICD-10-CM | POA: Diagnosis not present

## 2016-12-26 DIAGNOSIS — I129 Hypertensive chronic kidney disease with stage 1 through stage 4 chronic kidney disease, or unspecified chronic kidney disease: Secondary | ICD-10-CM | POA: Diagnosis not present

## 2016-12-26 MED ORDER — LISINOPRIL 5 MG PO TABS: 5.0000 mg | ORAL_TABLET | Freq: Every day | ORAL | 3 refills | Status: DC

## 2016-12-26 NOTE — Patient Instructions (Addendum)
F/ U  As before , call if you need me sooner  CBC, chem 7  Today   You are referred for carotid doppler and to see the cardiologist we will call with appts  Cut back meclizine one twice daily for today and tomorrow, then one daily for next 2 days then stop if able  New lower dose of blood pressure medicine  Is to be started tomorrow, lisinopril 5 mg one daily ( you were taking 10 mg one daily)  [facility] is very good for you

## 2016-12-26 NOTE — Telephone Encounter (Signed)
carotid dopplar scheduled for mon 01/01/17 at 1:30. Patients daughter aware

## 2016-12-27 LAB — CBC
HEMATOCRIT: 44.9 % (ref 35.0–45.0)
HEMOGLOBIN: 14.8 g/dL (ref 11.7–15.5)
MCH: 30 pg (ref 27.0–33.0)
MCHC: 33 g/dL (ref 32.0–36.0)
MCV: 91.1 fL (ref 80.0–100.0)
MPV: 11.6 fL (ref 7.5–12.5)
Platelets: 270 10*3/uL (ref 140–400)
RBC: 4.93 MIL/uL (ref 3.80–5.10)
RDW: 14 % (ref 11.0–15.0)
WBC: 7.1 10*3/uL (ref 3.8–10.8)

## 2016-12-27 LAB — COMPLETE METABOLIC PANEL WITH GFR
ALBUMIN: 4 g/dL (ref 3.6–5.1)
ALT: 11 U/L (ref 6–29)
AST: 23 U/L (ref 10–35)
Alkaline Phosphatase: 73 U/L (ref 33–130)
BUN: 23 mg/dL (ref 7–25)
CALCIUM: 9.9 mg/dL (ref 8.6–10.4)
CHLORIDE: 108 mmol/L (ref 98–110)
CO2: 22 mmol/L (ref 20–32)
CREATININE: 1.52 mg/dL — AB (ref 0.60–0.88)
GFR, Est African American: 34 mL/min — ABNORMAL LOW (ref >=60)
GFR, Est Non African American: 29 mL/min — ABNORMAL LOW (ref >=60)
GLUCOSE: 107 mg/dL — AB (ref 65–99)
Potassium: 4.2 mmol/L (ref 3.5–5.3)
Sodium: 144 mmol/L (ref 135–146)
Total Bilirubin: 0.4 mg/dL (ref 0.2–1.2)
Total Protein: 6.5 g/dL (ref 6.1–8.1)

## 2016-12-28 DIAGNOSIS — Z7982 Long term (current) use of aspirin: Secondary | ICD-10-CM | POA: Diagnosis not present

## 2016-12-28 DIAGNOSIS — E785 Hyperlipidemia, unspecified: Secondary | ICD-10-CM | POA: Diagnosis not present

## 2016-12-28 DIAGNOSIS — K219 Gastro-esophageal reflux disease without esophagitis: Secondary | ICD-10-CM | POA: Diagnosis not present

## 2016-12-28 DIAGNOSIS — N183 Chronic kidney disease, stage 3 (moderate): Secondary | ICD-10-CM | POA: Diagnosis not present

## 2016-12-28 DIAGNOSIS — I129 Hypertensive chronic kidney disease with stage 1 through stage 4 chronic kidney disease, or unspecified chronic kidney disease: Secondary | ICD-10-CM | POA: Diagnosis not present

## 2016-12-28 DIAGNOSIS — R42 Dizziness and giddiness: Secondary | ICD-10-CM | POA: Diagnosis not present

## 2016-12-29 ENCOUNTER — Telehealth: Payer: Self-pay

## 2016-12-29 DIAGNOSIS — N183 Chronic kidney disease, stage 3 unspecified: Secondary | ICD-10-CM

## 2016-12-29 NOTE — Telephone Encounter (Signed)
-----  Message from Fayrene Helper, MD sent at 12/28/2016 12:34 PM EDT ----- Pls let her daughter , Rodena Piety, know that pt is not anemic, blood count is good Kidney function is n not as good as in the past, not totally clear why, hopefully reducing dose of blood pressure medication will help this. Please ensure Mom drinks enough water . Needs repeat chem 7 and eGFR , non fasting in 2 weeks pls advise and order, thanks!  ?? pls ask

## 2016-12-31 NOTE — Progress Notes (Signed)
   Kristy Jarvis     MRN: [id number]      DOB: [birth year]   HPI Kristy Jarvis is here for follow up of recent hospitalization from 7/29 to 12/19/2016 , when she was admitted with intractable vertigo and nausea. Brain imaging was negative for an cute event, and her subsequent ENT follow up is negative for vestibular cause of the vertigo. I have advised her daughter to taper the antivert over the next several days as able, and she is currently receiving in home physical therapy to work with balance. Nausea has resolved , appetite is returning gradually, shew does c/o some light headedness when she stands, denies overt spinning sensation most of the time ROS See HPI  Denies recent fever or chills. Denies sinus pressure, nasal congestion, ear pain or sore throat. Denies chest congestion, productive cough or wheezing. Denies chest pains, palpitations and leg swelling Denies abdominal pain, nausea, vomiting,diarrhea or constipation.   Denies dysuria, frequency, hesitancy or incontinence. Denies skin break down or rash.   PE  BP 120/60 (BP Location: Left Arm, Patient Position: Sitting, Cuff Size: Normal)   Pulse 80   Temp (!) 97 F (36.1 C) (Other (Comment))   Resp 16   Ht 5' (1.524 m)   Wt 98 lb 12 oz (44.8 kg)   SpO2 97%   BMI 19.29 kg/m   Patient alert and oriented and in no cardiopulmonary distress.  HEENT: No facial asymmetry, EOMI,   oropharynx pink and moist.  Neck supple no JVD, no mass.no nystagmus. Carotid Bruit present Chest: Clear to auscultation bilaterally.  CVS: S1, S2 no murmurs, no S3.Regular rate.  ABD: Soft non tender.   Ext: No edema  MS: Adequate though reduced  ROM spine, shoulders, hips and knees.  Skin: Intact, no ulcerations or rash noted.  Psych: Good eye contact, normal affect. Memory intact not anxious or depressed appearing.  CNS: CN 2-12 intact, power,  normal throughout.no focal deficits noted.   [facility] discharge  follow-up Remains light headed, no acute intracranial pathology,eNT evaluation non revealing . Newly diagnosed with hypertension during hospitalization, , relatively hypotensive in office, medication dose reduced  wih close follow up Hospital tests reviewed with pt and daughter . Plan is to taper  Off the antivert as pt not deemed to have vestibular pathology per recent ENT eval Lab done at follow up reveals AKI, may be due to ACE, dose has been reduced and this is to be repeated   HTN (hypertension) Relatively hypotensive in office with light headedness, dose of ACE reduced   Light headedness Recurrent light headedness with hospitalization in 11/2016. Carotid doppler re ordered and cardiology to further evaluate

## 2016-12-31 NOTE — Assessment & Plan Note (Signed)
Relatively hypotensive in office with light headedness, dose of ACE reduced

## 2016-12-31 NOTE — Assessment & Plan Note (Signed)
Remains light headed, no acute intracranial pathology,eNT evaluation non revealing . Newly diagnosed with hypertension during hospitalization, , relatively hypotensive in office, medication dose reduced  wih close follow up Hospital tests reviewed with pt and daughter . Plan is to taper  Off the antivert as pt not deemed to have vestibular pathology per recent ENT eval Lab done at follow up reveals AKI, may be due to ACE, dose has been reduced and this is to be repeated

## 2016-12-31 NOTE — Assessment & Plan Note (Signed)
Recurrent light headedness with hospitalization in 11/2016. Carotid doppler re ordered and cardiology to further evaluate

## 2017-01-01 ENCOUNTER — Ambulatory Visit (HOSPITAL_COMMUNITY)
Admission: RE | Admit: 2017-01-01 | Discharge: 2017-01-01 | Disposition: A | Discharge status: Home / Self Care | Payer: Medicare Other | Source: Ambulatory Visit | Attending: Family Medicine | Admitting: Family Medicine

## 2017-01-01 ENCOUNTER — Ambulatory Visit (INDEPENDENT_AMBULATORY_CARE_PROVIDER_SITE_OTHER): Payer: Medicare Other

## 2017-01-01 DIAGNOSIS — R0989 Other specified symptoms and signs involving the circulatory and respiratory systems: Secondary | ICD-10-CM

## 2017-01-01 DIAGNOSIS — R42 Dizziness and giddiness: Secondary | ICD-10-CM | POA: Diagnosis not present

## 2017-01-01 DIAGNOSIS — Z111 Encounter for screening for respiratory tuberculosis: Secondary | ICD-10-CM

## 2017-01-01 DIAGNOSIS — I6523 Occlusion and stenosis of bilateral carotid arteries: Secondary | ICD-10-CM | POA: Insufficient documentation

## 2017-01-01 NOTE — Progress Notes (Signed)
PPD placed in left forearm and instructed to come back for reading wed afternoon

## 2017-01-02 DIAGNOSIS — R42 Dizziness and giddiness: Secondary | ICD-10-CM | POA: Diagnosis not present

## 2017-01-02 DIAGNOSIS — Z7982 Long term (current) use of aspirin: Secondary | ICD-10-CM | POA: Diagnosis not present

## 2017-01-02 DIAGNOSIS — K219 Gastro-esophageal reflux disease without esophagitis: Secondary | ICD-10-CM | POA: Diagnosis not present

## 2017-01-02 DIAGNOSIS — I129 Hypertensive chronic kidney disease with stage 1 through stage 4 chronic kidney disease, or unspecified chronic kidney disease: Secondary | ICD-10-CM | POA: Diagnosis not present

## 2017-01-02 DIAGNOSIS — E785 Hyperlipidemia, unspecified: Secondary | ICD-10-CM | POA: Diagnosis not present

## 2017-01-02 DIAGNOSIS — N183 Chronic kidney disease, stage 3 (moderate): Secondary | ICD-10-CM | POA: Diagnosis not present

## 2017-01-04 DIAGNOSIS — E785 Hyperlipidemia, unspecified: Secondary | ICD-10-CM | POA: Diagnosis not present

## 2017-01-04 DIAGNOSIS — K219 Gastro-esophageal reflux disease without esophagitis: Secondary | ICD-10-CM | POA: Diagnosis not present

## 2017-01-04 DIAGNOSIS — N183 Chronic kidney disease, stage 3 (moderate): Secondary | ICD-10-CM | POA: Diagnosis not present

## 2017-01-04 DIAGNOSIS — I129 Hypertensive chronic kidney disease with stage 1 through stage 4 chronic kidney disease, or unspecified chronic kidney disease: Secondary | ICD-10-CM | POA: Diagnosis not present

## 2017-01-04 DIAGNOSIS — Z7982 Long term (current) use of aspirin: Secondary | ICD-10-CM | POA: Diagnosis not present

## 2017-01-04 DIAGNOSIS — R42 Dizziness and giddiness: Secondary | ICD-10-CM | POA: Diagnosis not present

## 2017-01-08 ENCOUNTER — Other Ambulatory Visit: Payer: Self-pay | Admitting: Family Medicine

## 2017-01-08 NOTE — Telephone Encounter (Signed)
Seen [phone] 

## 2017-01-14 IMAGING — DX DG ABDOMEN 1V
2 series · 2 of 2 positions shown · non-contrast
Comparison: Renal ultrasound January 16, 2016

CLINICAL DATA: History of urinary tract stones

EXAM:
ABDOMEN - 1 VIEW

[abdomen kub (1 of 2)]
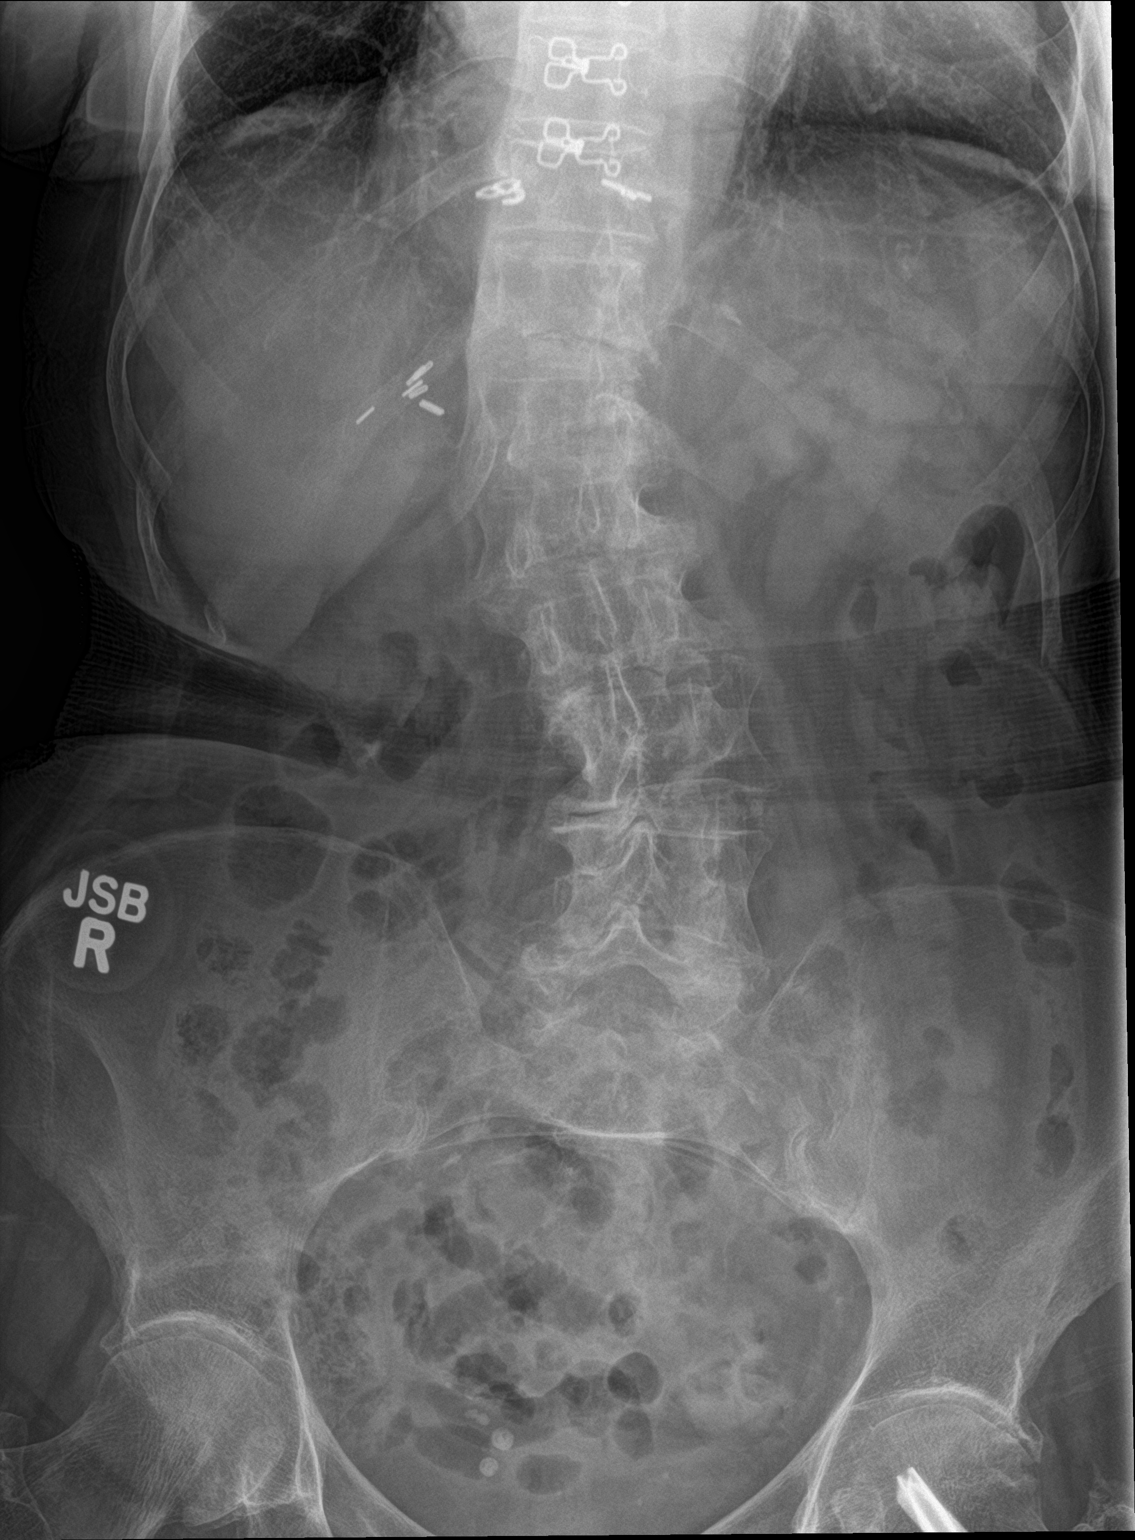

[abdomen kub (2 of 2)]
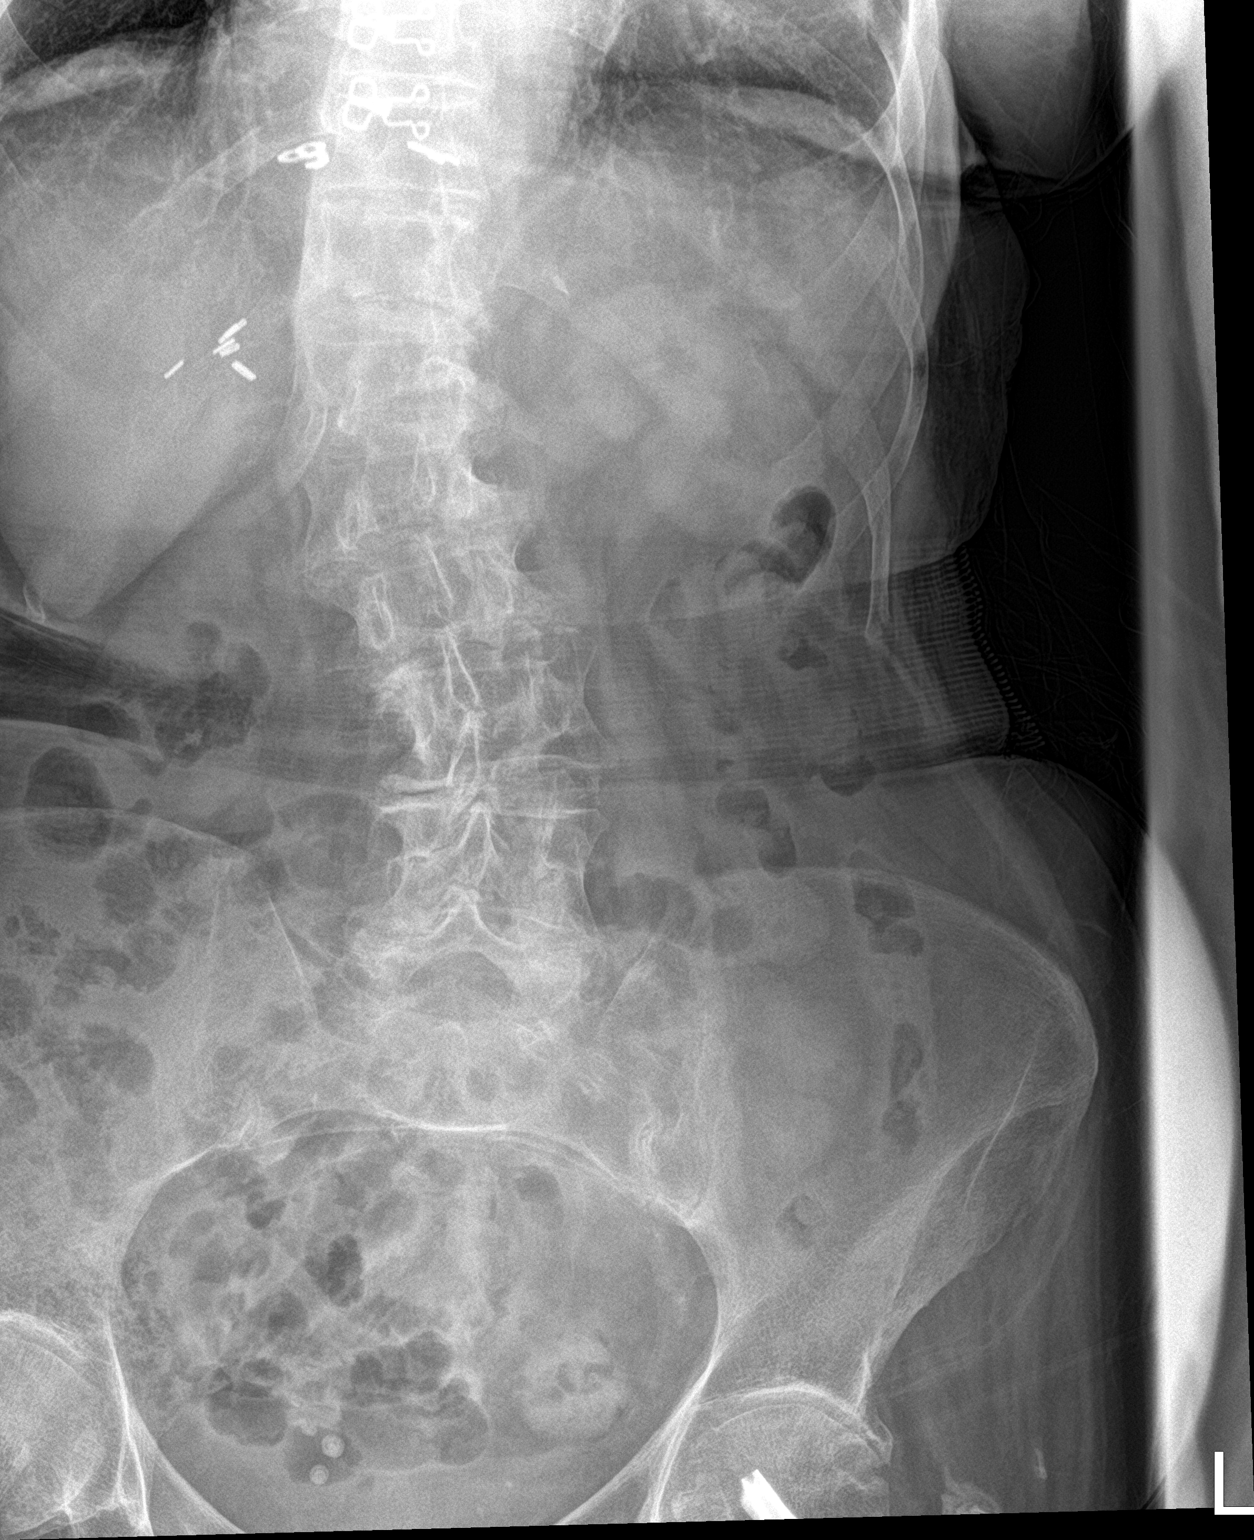

[2 of 2 positions shown; findings below may reference images not displayed]

FINDINGS: An 8 mm diameter lower pole stone was identified on today's
ultrasound. There is a radiodensity that projects over the lower
pole of the right kidney today corresponding to this finding. No
definite stones on the left are observed. No ureteral or bladder
stones are demonstrated. There are phleboliths within the pelvis.
There surgical clips in the gallbladder fossa. There is moderate S
shaped lumbar curvature.
IMPRESSION: 8 mm lower pole right-sided kidney stone demonstrated on today's
study which corresponds to the ultrasound finding. No definite
stones observed elsewhere.

## 2017-01-16 DIAGNOSIS — N183 Chronic kidney disease, stage 3 (moderate): Secondary | ICD-10-CM | POA: Diagnosis not present

## 2017-01-17 ENCOUNTER — Other Ambulatory Visit: Payer: Self-pay | Admitting: Family Medicine

## 2017-01-17 LAB — BASIC METABOLIC PANEL WITH GFR
BUN: 16 mg/dL (ref 7–25)
CALCIUM: 9.8 mg/dL (ref 8.6–10.4)
CO2: 25 mmol/L (ref 20–32)
Chloride: 107 mmol/L (ref 98–110)
Creat: 1.3 mg/dL — ABNORMAL HIGH (ref 0.60–0.88)
GFR, EST AFRICAN AMERICAN: 41 mL/min — AB (ref >=60)
GFR, EST NON AFRICAN AMERICAN: 35 mL/min — AB (ref >=60)
GLUCOSE: 107 mg/dL — AB (ref 65–99)
POTASSIUM: 4 mmol/L (ref 3.5–5.3)
SODIUM: 143 mmol/L (ref 135–146)

## 2017-01-23 ENCOUNTER — Encounter: Payer: Self-pay | Admitting: Family Medicine

## 2017-01-23 ENCOUNTER — Ambulatory Visit (INDEPENDENT_AMBULATORY_CARE_PROVIDER_SITE_OTHER): Payer: Medicare Other | Admitting: Family Medicine

## 2017-01-23 VITALS — BP 124/72 | HR 76 | Temp 97.7°F | Ht 60.0 in | Wt 99.0 lb

## 2017-01-23 DIAGNOSIS — Z23 Encounter for immunization: Secondary | ICD-10-CM

## 2017-01-23 DIAGNOSIS — I10 Essential (primary) hypertension: Secondary | ICD-10-CM | POA: Diagnosis not present

## 2017-01-23 DIAGNOSIS — Z0000 Encounter for general adult medical examination without abnormal findings: Secondary | ICD-10-CM | POA: Diagnosis not present

## 2017-01-23 DIAGNOSIS — E785 Hyperlipidemia, unspecified: Secondary | ICD-10-CM

## 2017-01-23 LAB — HEMOCCULT GUIAC POC 1CARD (OFFICE): Card #2 Fecal Occult Blod, POC: NEGATIVE

## 2017-01-23 NOTE — Patient Instructions (Signed)
F/u in 4.5 months, call if you eed me sooner  Fasting lipid, cmp and EGFr, and cBC 1 week before visit .  Flu vaccine   Today  Please be careful not to fall and enjot the [facility]   Thank you  for choosing [facility]. We consider it a privelige to serve you.  Delivering excellent health care in a caring and  compassionate way is our goal.  Partnering with you,  so that together we can achieve this goal is our strategy.

## 2017-01-26 DIAGNOSIS — I129 Hypertensive chronic kidney disease with stage 1 through stage 4 chronic kidney disease, or unspecified chronic kidney disease: Secondary | ICD-10-CM | POA: Diagnosis not present

## 2017-01-26 DIAGNOSIS — E785 Hyperlipidemia, unspecified: Secondary | ICD-10-CM | POA: Diagnosis not present

## 2017-01-26 DIAGNOSIS — R42 Dizziness and giddiness: Secondary | ICD-10-CM | POA: Diagnosis not present

## 2017-01-26 DIAGNOSIS — N183 Chronic kidney disease, stage 3 (moderate): Secondary | ICD-10-CM | POA: Diagnosis not present

## 2017-01-27 NOTE — Progress Notes (Signed)
    Kristy Jarvis     MRN: [id number]      DOB: 25-Oct-1920  HPI: Patient is in for annual physical exam. No other health concerns are expressed or addressed at the visit. Recent labs, if available are reviewed. Immunization is reviewed , and  updated .   PE: BP 124/72 (BP Location: Left Arm, Patient Position: Sitting, Cuff Size: Normal)   Pulse 76   Temp 97.7 F (36.5 C)   Ht 5' (1.524 m)   Wt 99 lb (44.9 kg)   SpO2 92%   BMI 19.33 kg/m   Pleasant  female, alert and oriented x 3, in no cardio-pulmonary distress. Afebrile. HEENT No facial trauma or asymetry. Sinuses non tender.  Extra occullar muscles intact,. External ears normal, tympanic membranes clear. Oropharynx moist, no exudate. Neck: supple, no adenopathy,JVD or thyromegaly.No bruits.  Chest: Clear to ascultation bilaterally.No crackles or wheezes. Non tender to palpation  Breast: No asymetry,no masses or lumps. No tenderness. No nipple discharge or inversion. No axillary or supraclavicular adenopathy  Cardiovascular system; Heart sounds normal,  S1 and  S2 ,no S3.  No murmur, or thrill. Apical beat not displaced Peripheral pulses normal.  Abdomen: Soft, non tender, no organomegaly abdominal hernia No bruits. Bowel sounds normal. No guarding, tenderness or rebound.  Rectal:  Normal sphincter tone. No rectal mass. Guaiac negative stool.  GU: Not examined  Musculoskeletal exam: Decreased ROM of spine, hips , shoulders and knees. No deformity , noted. No muscle wasting or atrophy.   Neurologic: Cranial nerves 2 to 12 intact. Power, tone ,sensation and reflexes normal throughout.  disturbance in gait. No tremor.  Skin: Intact, no ulceration, erythema , scaling or rash noted. Pigmentation normal throughout  Psych; Normal mood and affect. Judgement and concentration normal   Assessment & Plan:  Annual physical exam Annual exam as documented.  Immunization and cancer screening needs  are specifically addressed at this visit.

## 2017-01-27 NOTE — Assessment & Plan Note (Signed)
Annual exam as documented. . Immunization and cancer screening needs are specifically addressed at this visit.  

## 2017-02-01 ENCOUNTER — Encounter: Payer: Self-pay | Admitting: Cardiology

## 2017-02-01 NOTE — Progress Notes (Signed)
Cardiology Office Note  Date: 02/02/2017   ID: Kristy Jarvis, DOB 16-Nov-1920, MRN [id number]  PCP: Fayrene Helper, MD  Consulting Cardiologist: Rozann Lesches, MD   Chief Complaint  Patient presents with  . Dizziness    History of Present Illness: Kristy Jarvis is a 83 y.o. female referred for cardiology consultation by Dr. Benjamine Mola due to episodic dizziness. She presents with her daughter today. She describes a several month history of episodic dizziness, mainly when she is standing, not associated with any chest pain or palpitations, and not associated with frank syncope. She states that symptoms last sometimes for a few seconds, sometimes for several minutes. Her daughter indicates that her mother has complained that symptoms are more persistent at times. She uses a walker to ambulate. She states that it feels like her "head is moving" when she has the symptoms, perhaps more suggestive of vertigo.   Recent visit with Dr. Benjamine Mola noted in August, patient was found to have moderate to severe sensorineural hearing loss. He recommended cardiac evaluation and possibly vestibular rehabilitation. She had workup earlier in the year including brain MRI that did not show any evidence of stroke and also carotid Dopplers showing no hemodynamically significant ICA stenoses.  I reviewed her medications. She is on lisinopril for control of hypertension.  Orthostatics were obtained today in clinic and were not diagnostic. Supine blood pressure 134/84 with heart rate 67, seated blood pressure 140/86 with heart rate 75, and standing blood pressure 138/84 with heart rate 82.  Past Medical History:  Diagnosis Date  . Cataract   . Diverticulosis of colon    Noted by CT  . GERD (gastroesophageal reflux disease)   . Hyperlipidemia   . Intermittent vertigo   . Osteoporosis   . Renal colic     Past Surgical History:  Procedure Laterality Date  . ABDOMINAL HYSTERECTOMY    . BUNIONECTOMY   06/27/2011   left , Dr Berline Lopes  . CATARACT EXTRACTION  2002   left eye   . CHOLECYSTECTOMY  1989  . ESOPHAGOGASTRODUODENOSCOPY  Jan  2007   prominent schatzki's ring, s/p 35F, small to moderate size hh  . EYE SURGERY  2005 approx   cataract left  . HIP SURGERY Left 12/20/2013  . INTRAMEDULLARY (IM) NAIL INTERTROCHANTERIC Left 01/02/2014   Procedure: INTRAMEDULLARY (IM) NAIL INTERTROCHANTRIC;  Surgeon: Newt Minion, MD;  Location: [place];  Service: Orthopedics;  Laterality: Left;    Current Outpatient Prescriptions  Medication Sig Dispense Refill  . calcium carbonate (TUMS EX) 750 MG chewable tablet Chew 2 tablets by mouth daily.     . Cholecalciferol (VITAMIN D PO) Take 1 tablet by mouth daily.    Marland Kitchen lisinopril (PRINIVIL,ZESTRIL) 5 MG tablet Take 1 tablet (5 mg total) by mouth daily. 30 tablet 3  . lovastatin (MEVACOR) 40 MG tablet TAKE (1) TABLET BY MOUTH AT BEDTIME. 90 tablet 1  . Melatonin 300 MCG TABS Take 1 tablet by mouth at bedtime.    Marland Kitchen omeprazole (PRILOSEC) 20 MG capsule TAKE (1) CAPSULE BY MOUTH AT BEDTIME. 90 capsule 0  . ranitidine (ZANTAC) 150 MG tablet TAKE (1) TABLET BY MOUTH TWICE DAILY. 180 tablet 0   No current facility-administered medications for this visit.    Allergies:  Alendronate sodium and Penicillins   Social History: The patient  reports that she has never smoked. She has never used smokeless tobacco. She reports that she does not drink alcohol or use drugs.   Family  History: The patient's family history includes Diabetes in her sister, sister, and sister; Heart attack in her father; Heart disease in her brother; Liver cancer in her unknown relative; Parkinson's disease in her sister; Prostate cancer in her brother.   ROS:  Please see the history of present illness. Otherwise, complete review of systems is positive for hearing loss, unsteadiness on her feet.  All other systems are reviewed and negative.   Physical Exam: VS:  BP 134/68 (BP Location: Left Arm)    Pulse 88   Ht 5' (1.524 m)   Wt 99 lb 9.6 oz (45.2 kg)   BMI 19.45 kg/m , BMI Body mass index is 19.45 kg/m.  Wt Readings from Last 3 Encounters:  02/02/17 99 lb 9.6 oz (45.2 kg)  01/23/17 99 lb (44.9 kg)  12/26/16 98 lb 12 oz (44.8 kg)    General: Somewhat frail-appearing elderly woman in no distress. HEENT: Conjunctiva and lids normal, oropharynx clear. Neck: Supple, no elevated JVP or carotid bruits, no thyromegaly. Lungs: Clear to auscultation, nonlabored breathing at rest. Cardiac: Regular rate and rhythm, no S3, soft systolic murmur, no pericardial rub. Abdomen: Soft, nontender, bowel sounds present, no guarding or rebound. Extremities: No pitting edema, distal pulses 2+. Skin: Warm and dry. Musculoskeletal:  Kyphosis noted. Neuropsychiatric: Alert and oriented x3, affect grossly appropriate.  ECG: I personally reviewed the tracing from 12/17/2016 which showed sinus bradycardia with nonspecific ST changes.  Recent Labwork: 12/26/2016: ALT 11; AST 23; Hemoglobin 14.8; Platelets 270 01/16/2017: BUN 16; Creat 1.30; Potassium 4.0; Sodium 143     Component Value Date/Time   CHOL 175 08/03/2016 1001   TRIG 155 (H) 08/03/2016 1001   HDL 80 08/03/2016 1001   CHOLHDL 2.2 08/03/2016 1001   VLDL 31 (H) 08/03/2016 1001   LDLCALC 64 08/03/2016 1001   LDLDIRECT 85 06/17/2012 0722    Other Studies Reviewed Today:  Carotid Dopplers 01/01/2017: IMPRESSION: 1. Mild bilateral carotid bifurcation atherosclerotic vascular disease. No flow limiting stenosis. Degree of stenosis less than 50% bilaterally.  2. Vertebrals are patent antegrade flow.  Brain MRI 12/18/2016: IMPRESSION: No acute intracranial abnormality. Atrophy and chronic microvascular ischemic change in the white matter. Stable since 2017.  Chest x-ray 12/17/2016: FINDINGS: 1148 hours. Stable cardiomegaly, aortic atherosclerosis and moderate size hiatal hernia. The lungs are clear. There is no pleural effusion or  pneumothorax. There is posttraumatic deformity of the left humeral head from previous fracture. No acute osseous findings are evident. Surgical clips are present in the right upper quadrant of the abdomen.  IMPRESSION: No acute cardiopulmonary process. Cardiomegaly and moderate hiatal hernia.  Assessment and Plan:  1. Episodic dizziness, mainly with standing, also position change in general. Patient states that this occurs nearly every day. She has had no frank syncope and no associated chest pain or palpitations. She seems to be describing vertigo more so than lightheadedness necessarily. She was not found to be orthostatic today on evaluation. Workup in July included brain MRI without evidence of stroke and carotid Doppler showing no hemodynamic significant ICA disease. We will plan to obtain an echocardiogram to assess cardiac structure and function and also a 24-hour Holter to evaluate heart rate variability and exclude any associated arrhythmias. Cardiogenic cause for her symptoms looks to be less likely at this point however. More suggestive of a vestibular/balance mechanism, she might benefit from PT.  2. History of hypertension, on lisinopril.  3. GERD, symptomatically stable on Zantac.  Current medicines were reviewed with the patient today.  Orders Placed This Encounter  Procedures  . Holter monitor - 24 hour  . ECHOCARDIOGRAM COMPLETE    Disposition: Call with test results.  Signed, Satira Sark, MD, [facility] 02/02/2017 11:17 AM    [facility] [address] [place], [place] [address] Phone: [phone]; Fax: [phone]

## 2017-02-02 ENCOUNTER — Encounter: Payer: Self-pay | Admitting: Cardiology

## 2017-02-02 ENCOUNTER — Ambulatory Visit (INDEPENDENT_AMBULATORY_CARE_PROVIDER_SITE_OTHER): Payer: Medicare Other | Admitting: Cardiology

## 2017-02-02 VITALS — BP 134/68 | HR 88 | Ht 60.0 in | Wt 99.6 lb

## 2017-02-02 DIAGNOSIS — K219 Gastro-esophageal reflux disease without esophagitis: Secondary | ICD-10-CM

## 2017-02-02 DIAGNOSIS — I10 Essential (primary) hypertension: Secondary | ICD-10-CM | POA: Diagnosis not present

## 2017-02-02 DIAGNOSIS — R42 Dizziness and giddiness: Secondary | ICD-10-CM

## 2017-02-02 NOTE — Patient Instructions (Signed)
Your physician recommends that you schedule a follow-up appointment in: We will call you with results  Your physician recommends that you continue on your current medications as directed. Please refer to the Current Medication list given to you today.    Your physician has requested that you have an echocardiogram. Echocardiography is a painless test that uses sound waves to create images of your heart. It provides your doctor with information about the size and shape of your heart and how well your heart's chambers and valves are working. This procedure takes approximately one hour. There are no restrictions for this procedure.     Your physician has recommended that you wear a holter monitor. Holter monitors are medical devices that record the heart's electrical activity. Doctors most often use these monitors to diagnose arrhythmias. Arrhythmias are problems with the speed or rhythm of the heartbeat. The monitor is a small, portable device. You can wear one while you do your normal daily activities. This is usually used to diagnose what is causing palpitations/syncope (passing out).      No lab work ordered today        Thank you for choosing [facility] !

## 2017-02-08 ENCOUNTER — Ambulatory Visit (HOSPITAL_COMMUNITY)
Admission: RE | Admit: 2017-02-08 | Discharge: 2017-02-08 | Disposition: A | Discharge status: Home / Self Care | Payer: Medicare Other | Source: Ambulatory Visit | Attending: Cardiology | Admitting: Cardiology

## 2017-02-08 DIAGNOSIS — I071 Rheumatic tricuspid insufficiency: Secondary | ICD-10-CM | POA: Insufficient documentation

## 2017-02-08 DIAGNOSIS — E785 Hyperlipidemia, unspecified: Secondary | ICD-10-CM | POA: Diagnosis not present

## 2017-02-08 DIAGNOSIS — R42 Dizziness and giddiness: Secondary | ICD-10-CM | POA: Insufficient documentation

## 2017-02-08 DIAGNOSIS — I119 Hypertensive heart disease without heart failure: Secondary | ICD-10-CM | POA: Diagnosis not present

## 2017-02-08 NOTE — Progress Notes (Signed)
*  PRELIMINARY RESULTS* Echocardiogram 2D Echocardiogram has been performed.  Leavy Cella 02/08/2017, 12:12 PM

## 2017-03-03 ENCOUNTER — Other Ambulatory Visit: Payer: Self-pay | Admitting: Family Medicine

## 2017-03-05 NOTE — Telephone Encounter (Signed)
Seen [phone] 

## 2017-03-22 ENCOUNTER — Other Ambulatory Visit: Payer: Self-pay | Admitting: Family Medicine

## 2017-04-02 ENCOUNTER — Telehealth: Payer: Self-pay | Admitting: Family Medicine

## 2017-04-02 NOTE — Telephone Encounter (Signed)
[place], Nurse case manager with [facility], requests call back regarding patinet  Callback# [phone] x [phone]

## 2017-04-05 NOTE — Telephone Encounter (Signed)
Called and got voicemail and left message to call back

## 2017-04-18 ENCOUNTER — Other Ambulatory Visit: Payer: Self-pay | Admitting: Family Medicine

## 2017-04-25 ENCOUNTER — Encounter: Payer: Self-pay | Admitting: Family Medicine

## 2017-05-08 ENCOUNTER — Encounter: Payer: Self-pay | Admitting: Family Medicine

## 2017-05-08 ENCOUNTER — Ambulatory Visit (HOSPITAL_COMMUNITY)
Admission: RE | Admit: 2017-05-08 | Discharge: 2017-05-08 | Disposition: A | Discharge status: Home / Self Care | Payer: Medicare Other | Source: Ambulatory Visit | Attending: Family Medicine | Admitting: Family Medicine

## 2017-05-08 ENCOUNTER — Ambulatory Visit (INDEPENDENT_AMBULATORY_CARE_PROVIDER_SITE_OTHER): Payer: Medicare Other | Admitting: Family Medicine

## 2017-05-08 VITALS — BP 132/70 | HR 72 | Temp 97.6°F | Resp 16 | Ht 60.0 in | Wt 102.0 lb

## 2017-05-08 DIAGNOSIS — K449 Diaphragmatic hernia without obstruction or gangrene: Secondary | ICD-10-CM | POA: Insufficient documentation

## 2017-05-08 DIAGNOSIS — J209 Acute bronchitis, unspecified: Secondary | ICD-10-CM | POA: Diagnosis not present

## 2017-05-08 DIAGNOSIS — J0100 Acute maxillary sinusitis, unspecified: Secondary | ICD-10-CM

## 2017-05-08 DIAGNOSIS — J40 Bronchitis, not specified as acute or chronic: Secondary | ICD-10-CM | POA: Diagnosis not present

## 2017-05-08 DIAGNOSIS — I10 Essential (primary) hypertension: Secondary | ICD-10-CM

## 2017-05-08 DIAGNOSIS — Z9181 History of falling: Secondary | ICD-10-CM | POA: Diagnosis not present

## 2017-05-08 MED ORDER — DOXYCYCLINE HYCLATE 100 MG PO TABS: 100.0000 mg | ORAL_TABLET | Freq: Two times a day (BID) | ORAL | 0 refills | Status: DC

## 2017-05-08 MED ORDER — BENZONATATE 100 MG PO CAPS: 100.0000 mg | ORAL_CAPSULE | Freq: Two times a day (BID) | ORAL | 0 refills | Status: DC | PRN

## 2017-05-08 NOTE — Progress Notes (Signed)
   Kristy Jarvis     MRN: [id number]      DOB: [birth year]   HPI Ms. Pavich  1 week h/o worsening head and chest congestion, associated with fever and chills intermittently. Nasal drainage has thickened , and is yellowish green, and at times bloody. Sputum is thick and yellow. C/o bilateral ear pressure, denies hearing loss and sore throat. Increasing fatigue , poor appetitie and sleep disturbed by cough. No improvement with OTC medication.   ROS See HPI  Denies chest pains, palpitations and leg swelling Denies abdominal pain, nausea, vomiting,diarrhea or constipation.   Denies dysuria, frequency, hesitancy or incontinence. Denies joint pain, swelling and limitation in mobility. Denies headaches, seizures, numbness, or tingling. Denies depression, anxiety or insomnia. Denies skin break down or rash.   PE  BP 132/70   Pulse 72   Temp 97.6 F (36.4 C) (Oral)   Resp 16   Ht 5' (1.524 m)   Wt 102 lb (46.3 kg)   SpO2 96%   BMI 19.92 kg/m   Patient alert and oriented and in no cardiopulmonary distress. MIldly ill appearing  HEENT: No facial asymmetry, EOMI,   oropharynx pink and moist.  Neck supple no JVD, no mass.Maxillary sinus tenderness,  TM clear  Chest: adequate though reduced air entry, scattered crackles , no wheezes  CVS: S1, S2 no murmurs, no S3.Regular rate.  ABD: Soft non tender.   Ext: No edema  MS: Adequate ROM spine, shoulders, hips and knees.  Skin: Intact, no ulcerations or rash noted.  Psych: Good eye contact, normal affect. Memory intact not anxious or depressed appearing.  CNS: CN 2-12 intact, power,  normal throughout.no focal deficits noted.   Assessment & Plan Bronchitis, acute Decongestant and antibiotic prescribed, also CXR today, advised rest and fluids  HTN (hypertension) Controlled, no change in medication   At high risk for falls Home safety reviewed briefly  Maxillary sinusitis Antibiotic course prescribed

## 2017-05-08 NOTE — Patient Instructions (Signed)
F./u as before.cal if you need me sooner  You are treated for acute sinusitis and bronchitis  Two meds are at your pharmacy  Please get CXR after you leave  Rest and eat healthily, fluids also  Seasons best!  Will call with CXR report to your daughter, Rodena Piety [phone]

## 2017-05-09 DIAGNOSIS — J320 Chronic maxillary sinusitis: Secondary | ICD-10-CM | POA: Insufficient documentation

## 2017-05-09 NOTE — Assessment & Plan Note (Signed)
Controlled, no change in medication  

## 2017-05-09 NOTE — Assessment & Plan Note (Signed)
Antibiotic course prescribed 

## 2017-05-09 NOTE — Assessment & Plan Note (Signed)
Home safety reviewed briefly ?

## 2017-05-09 NOTE — Assessment & Plan Note (Addendum)
Decongestant and antibiotic prescribed, also CXR today, advised rest and fluids

## 2017-06-08 DIAGNOSIS — I10 Essential (primary) hypertension: Secondary | ICD-10-CM | POA: Diagnosis not present

## 2017-06-08 DIAGNOSIS — E785 Hyperlipidemia, unspecified: Secondary | ICD-10-CM | POA: Diagnosis not present

## 2017-06-08 LAB — COMPLETE METABOLIC PANEL WITH GFR
AG Ratio: 1.8 (calc) (ref 1.0–2.5)
ALT: 12 U/L (ref 6–29)
AST: 20 U/L (ref 10–35)
Albumin: 3.9 g/dL (ref 3.6–5.1)
Alkaline phosphatase (APISO): 61 U/L (ref 33–130)
BILIRUBIN TOTAL: 0.7 mg/dL (ref 0.2–1.2)
BUN/Creatinine Ratio: 11 (calc) (ref 6–22)
BUN: 14 mg/dL (ref 7–25)
CALCIUM: 9.6 mg/dL (ref 8.6–10.4)
CHLORIDE: 107 mmol/L (ref 98–110)
CO2: 31 mmol/L (ref 20–32)
Creat: 1.29 mg/dL — ABNORMAL HIGH (ref 0.60–0.88)
GFR, EST AFRICAN AMERICAN: 41 mL/min/{1.73_m2} — AB (ref >=60)
GFR, EST NON AFRICAN AMERICAN: 35 mL/min/{1.73_m2} — AB (ref >=60)
Globulin: 2.2 g/dL (calc) (ref 1.9–3.7)
Glucose, Bld: 90 mg/dL (ref 65–99)
POTASSIUM: 4 mmol/L (ref 3.5–5.3)
Sodium: 145 mmol/L (ref 135–146)
TOTAL PROTEIN: 6.1 g/dL (ref 6.1–8.1)

## 2017-06-08 LAB — CBC
HCT: 39.9 % (ref 35.0–45.0)
HEMOGLOBIN: 13.2 g/dL (ref 11.7–15.5)
MCH: 29.5 pg (ref 27.0–33.0)
MCHC: 33.1 g/dL (ref 32.0–36.0)
MCV: 89.1 fL (ref 80.0–100.0)
MPV: 11.7 fL (ref 7.5–12.5)
PLATELETS: 250 10*3/uL (ref 140–400)
RBC: 4.48 10*6/uL (ref 3.80–5.10)
RDW: 12.8 % (ref 11.0–15.0)
WBC: 6.6 10*3/uL (ref 3.8–10.8)

## 2017-06-08 LAB — LIPID PANEL
Cholesterol: 211 mg/dL — ABNORMAL HIGH (ref <200)
HDL: 82 mg/dL (ref >50)
LDL Cholesterol (Calc): 107 mg/dL (calc) — ABNORMAL HIGH
NON-HDL CHOLESTEROL (CALC): 129 mg/dL (ref <130)
Total CHOL/HDL Ratio: 2.6 (calc) (ref <5.0)
Triglycerides: 122 mg/dL (ref <150)

## 2017-06-12 ENCOUNTER — Ambulatory Visit (INDEPENDENT_AMBULATORY_CARE_PROVIDER_SITE_OTHER): Payer: Medicare Other | Admitting: Family Medicine

## 2017-06-12 ENCOUNTER — Encounter: Payer: Self-pay | Admitting: Family Medicine

## 2017-06-12 VITALS — BP 120/78 | HR 71 | Resp 16 | Ht 60.0 in | Wt 101.0 lb

## 2017-06-12 DIAGNOSIS — I10 Essential (primary) hypertension: Secondary | ICD-10-CM

## 2017-06-12 DIAGNOSIS — N183 Chronic kidney disease, stage 3 unspecified: Secondary | ICD-10-CM

## 2017-06-12 DIAGNOSIS — E785 Hyperlipidemia, unspecified: Secondary | ICD-10-CM | POA: Diagnosis not present

## 2017-06-12 DIAGNOSIS — Z9181 History of falling: Secondary | ICD-10-CM

## 2017-06-12 DIAGNOSIS — K219 Gastro-esophageal reflux disease without esophagitis: Secondary | ICD-10-CM

## 2017-06-12 MED ORDER — RANITIDINE HCL 150 MG PO TABS: 150.0000 mg | ORAL_TABLET | Freq: Two times a day (BID) | ORAL | 3 refills | Status: DC

## 2017-06-12 MED ORDER — RANITIDINE HCL 150 MG PO TABS: ORAL_TABLET | ORAL | 1 refills | Status: DC

## 2017-06-12 MED ORDER — LISINOPRIL 5 MG PO TABS: 5.0000 mg | ORAL_TABLET | Freq: Every day | ORAL | 1 refills | Status: DC

## 2017-06-12 MED ORDER — OMEPRAZOLE 20 MG PO CPDR: DELAYED_RELEASE_CAPSULE | ORAL | 1 refills | Status: DC

## 2017-06-12 MED ORDER — LOVASTATIN 40 MG PO TABS: ORAL_TABLET | ORAL | 1 refills | Status: DC

## 2017-06-12 NOTE — Assessment & Plan Note (Signed)
Hyperlipidemia:Low fat diet discussed and encouraged.   Lipid Panel  Lab Results  Component Value Date   CHOL 211 (H) 06/08/2017   HDL 82 06/08/2017   LDLCALC 64 08/03/2016   LDLDIRECT 85 06/17/2012   TRIG 122 06/08/2017   CHOLHDL 2.6 06/08/2017    No change in medication, controlled

## 2017-06-12 NOTE — Assessment & Plan Note (Signed)
Stble, importance of low sodium intake , avoidance of NSAIDS and nephrotoxic drugs and good BP control discussed

## 2017-06-12 NOTE — Assessment & Plan Note (Signed)
Controlled, no change in medication  

## 2017-06-12 NOTE — Patient Instructions (Addendum)
Annual wellness in June  With nurse  Annual physical exam with MD in October , call if you need me sooner  Thankful you are doing well         Fall Prevention in the Home Falls can cause injuries and can affect people from all age groups. There are many simple things that you can do to make your home safe and to help prevent falls. What can I do on the outside of my home?  Regularly repair the edges of walkways and driveways and fix any cracks.  Remove high doorway thresholds.  Trim any shrubbery on the main path into your home.  Use bright outdoor lighting.  Clear walkways of debris and clutter, including tools and rocks.  Regularly check that handrails are securely fastened and in good repair. Both sides of any steps should have handrails.  Install guardrails along the edges of any raised decks or porches.  Have leaves, snow, and ice cleared regularly.  Use sand or salt on walkways during winter months.  In the garage, clean up any spills right away, including grease or oil spills. What can I do in the bathroom?  Use night lights.  Install grab bars by the toilet and in the tub and shower. Do not use towel bars as grab bars.  Use non-skid mats or decals on the floor of the tub or shower.  If you need to sit down while you are in the shower, use a plastic, non-slip stool.  Keep the floor dry. Immediately clean up any water that spills on the floor.  Remove soap buildup in the tub or shower on a regular basis.  Attach bath mats securely with double-sided non-slip rug tape.  Remove throw rugs and other tripping hazards from the floor. What can I do in the bedroom?  Use night lights.  Make sure that a bedside light is easy to reach.  Do not use oversized bedding that drapes onto the floor.  Have a firm chair that has side arms to use for getting dressed.  Remove throw rugs and other tripping hazards from the floor. What can I do in the kitchen?  Clean  up any spills right away.  Avoid walking on wet floors.  Place frequently used items in easy-to-reach places.  If you need to reach for something above you, use a sturdy step stool that has a grab bar.  Keep electrical cables out of the way.  Do not use floor polish or wax that makes floors slippery. If you have to use wax, make sure that it is non-skid floor wax.  Remove throw rugs and other tripping hazards from the floor. What can I do in the stairways?  Do not leave any items on the stairs.  Make sure that there are handrails on both sides of the stairs. Fix handrails that are broken or loose. Make sure that handrails are as long as the stairways.  Check any carpeting to make sure that it is firmly attached to the stairs. Fix any carpet that is loose or worn.  Avoid having throw rugs at the top or bottom of stairways, or secure the rugs with carpet tape to prevent them from moving.  Make sure that you have a light switch at the top of the stairs and the bottom of the stairs. If you do not have them, have them installed. What are some other fall prevention tips?  Wear closed-toe shoes that fit well and support your feet. Wear shoes  that have rubber soles or low heels.  When you use a stepladder, make sure that it is completely opened and that the sides are firmly locked. Have someone hold the ladder while you are using it. Do not climb a closed stepladder.  Add color or contrast paint or tape to grab bars and handrails in your home. Place contrasting color strips on the first and last steps.  Use mobility aids as needed, such as canes, walkers, scooters, and crutches.  Turn on lights if it is dark. Replace any light bulbs that burn out.  Set up furniture so that there are clear paths. Keep the furniture in the same spot.  Fix any uneven floor surfaces.  Choose a carpet design that does not hide the edge of steps of a stairway.  Be aware of any and all pets.  Review your  medicines with your healthcare provider. Some medicines can cause dizziness or changes in blood pressure, which increase your risk of falling. Talk with your health care provider about other ways that you can decrease your risk of falls. This may include working with a physical therapist or trainer to improve your strength, balance, and endurance. This information is not intended to replace advice given to you by your health care provider. Make sure you discuss any questions you have with your health care provider. Document Released: 04/28/2002 Document Revised: 10/05/2015 Document Reviewed: 06/12/2014 Elsevier Interactive Patient Education  [employer].

## 2017-06-12 NOTE — Progress Notes (Signed)
   Kristy Jarvis     MRN: [id number]      DOB: [birth year]   HPI Kristy Jarvis is here for follow up and re-evaluation of chronic medical conditions, medication management and review of any available recent lab and radiology data.  . The PT denies any adverse reactions to current medications since the last visit.  There are no new concerns.  There are no specific complaints  Reports fair appetite, [facility] and [facility], she has had no falls, wears her safety necklace and she has adequate rest  ROS Denies recent fever or chills. Denies sinus pressure, nasal congestion, ear pain or sore throat. Denies chest congestion, productive cough or wheezing. Denies chest pains, palpitations and leg swelling Denies abdominal pain, nausea, vomiting,diarrhea or constipation.   Denies dysuria, frequency, hesitancy or incontinence. Denies uncontrolled joint pain, swelling and limitation in mobility. Denies headaches, seizures, numbness, or tingling. Denies depression, anxiety or insomnia. Denies skin break down or rash.   PE  BP 120/78   Pulse 71   Resp 16   Ht 5' (1.524 m)   Wt 101 lb (45.8 kg)   SpO2 98%   BMI 19.73 kg/m   Patient alert and oriented and in no cardiopulmonary distress.  HEENT: No facial asymmetry, EOMI,   oropharynx pink and moist.  Neck supple no JVD, no mass.  Chest: Clear to auscultation bilaterally.  CVS: S1, S2 no murmurs, no S3.Regular rate.  ABD: Soft non tender.   Ext: No edema  MS: decreased  ROM spine, shoulders, hips and knees.  Skin: Intact, no ulcerations or rash noted.  Psych: Good eye contact, normal affect. Memory intact not anxious or depressed appearing.  CNS: CN 2-12 intact, power,  normal throughout.no focal deficits noted.   Assessment & Plan  HTN (hypertension) Controlled, no change in medication   Hyperlipemia Hyperlipidemia:Low fat diet discussed and encouraged.   Lipid Panel  Lab Results    Component Value Date   CHOL 211 (H) 06/08/2017   HDL 82 06/08/2017   LDLCALC 64 08/03/2016   LDLDIRECT 85 06/17/2012   TRIG 122 06/08/2017   CHOLHDL 2.6 06/08/2017    No change in medication, controlled    CKD (chronic kidney disease) stage 3, GFR 30-59 ml/min Stble, importance of low sodium intake , avoidance of NSAIDS and nephrotoxic drugs and good BP control discussed   At high risk for falls Reminded to use her walker which she does and to wear her necklace, which she does. She is careful and has had no falls. Home safety reviewed briefly with the patient and her daughter  GERD Controlled, no change in medication

## 2017-06-12 NOTE — Assessment & Plan Note (Signed)
Reminded to use her walker which she does and to wear her necklace, which she does. She is careful and has had no falls. Home safety reviewed briefly with the patient and her daughter

## 2017-08-22 ENCOUNTER — Ambulatory Visit (INDEPENDENT_AMBULATORY_CARE_PROVIDER_SITE_OTHER): Payer: Medicare Other | Admitting: Urology

## 2017-08-22 DIAGNOSIS — N200 Calculus of kidney: Secondary | ICD-10-CM | POA: Diagnosis not present

## 2017-08-24 ENCOUNTER — Other Ambulatory Visit: Payer: Self-pay | Admitting: Urology

## 2017-08-24 DIAGNOSIS — N200 Calculus of kidney: Secondary | ICD-10-CM

## 2017-09-17 ENCOUNTER — Ambulatory Visit (HOSPITAL_COMMUNITY)
Admission: RE | Admit: 2017-09-17 | Discharge: 2017-09-17 | Disposition: A | Discharge status: Home / Self Care | Payer: Medicare Other | Source: Ambulatory Visit | Attending: Urology | Admitting: Urology

## 2017-09-17 DIAGNOSIS — K449 Diaphragmatic hernia without obstruction or gangrene: Secondary | ICD-10-CM | POA: Insufficient documentation

## 2017-09-17 DIAGNOSIS — N200 Calculus of kidney: Secondary | ICD-10-CM | POA: Insufficient documentation

## 2017-09-17 DIAGNOSIS — I700 Atherosclerosis of aorta: Secondary | ICD-10-CM | POA: Diagnosis not present

## 2017-10-01 ENCOUNTER — Other Ambulatory Visit: Payer: Self-pay | Admitting: Family Medicine

## 2017-11-12 ENCOUNTER — Ambulatory Visit: Payer: Medicare Other

## 2017-11-12 ENCOUNTER — Ambulatory Visit (INDEPENDENT_AMBULATORY_CARE_PROVIDER_SITE_OTHER): Payer: Medicare Other

## 2017-11-12 VITALS — BP 120/70 | HR 87 | Temp 98.4°F | Resp 18 | Ht 62.0 in | Wt 100.4 lb

## 2017-11-12 DIAGNOSIS — Z0000 Encounter for general adult medical examination without abnormal findings: Secondary | ICD-10-CM

## 2017-11-12 NOTE — Progress Notes (Signed)
Subjective:   Kristy Jarvis is a 84 y.o. female who presents for Medicare Annual (Subsequent) preventive examination.  Review of Systems:    Cardiac Risk Factors include: advanced age (>83men, >42 women);hypertension;dyslipidemia     Objective:     Vitals: BP 120/70 (BP Location: Left Arm, Patient Position: Sitting, Cuff Size: Small)   Pulse 87   Temp 98.4 F (36.9 C) (Temporal)   Resp 18   Ht 5\' 2"  (1.575 m)   Wt 100 lb 6.4 oz (45.5 kg)   SpO2 96%   BMI 18.36 kg/m   Body mass index is 18.36 kg/m.  Advanced Directives 11/12/2017 12/17/2016 12/17/2016 07/20/2016 11/24/2015 09/03/2015 02/23/2015  Does Patient Have a Medical Advance Directive? Yes Yes Yes Yes Yes Yes No  Type of Paramedic of [place];Living will Living will;Healthcare Power of Attorney Living will;Healthcare Power of [place];Living will Living will Living will;Healthcare Power of Attorney -  Does patient want to make changes to medical advance directive? No - Patient declined No - Patient declined - No - Patient declined - No - Patient declined -  Copy of [place] in Chart? Yes No - copy requested - Yes No - copy requested No - copy requested -  Would patient like information on creating a medical advance directive? - - - - - - No - patient declined information  Pre-existing out of facility DNR order (yellow form or pink MOST form) - - - - - - -    Tobacco Social History   Tobacco Use  Smoking Status Never Smoker  Smokeless Tobacco Never Used     Counseling given: Yes   Clinical Intake:  Pre-visit preparation completed: Yes  Pain : No/denies pain     BMI - recorded: 18.4 Nutritional Status: BMI <19  Underweight Nutritional Risks: None Diabetes: No  How often do you need to have someone help you when you read instructions, pamphlets, or other written materials from your doctor or pharmacy?: 1 - Never What is the last grade  level you completed in school?: 12  Interpreter Needed?: No  Information entered by :: ABBY  Past Medical History:  Diagnosis Date  . Cataract   . Diverticulosis of colon    Noted by CT  . GERD (gastroesophageal reflux disease)   . Hyperlipidemia   . Intermittent vertigo   . Osteoporosis   . Renal colic    Past Surgical History:  Procedure Laterality Date  . ABDOMINAL HYSTERECTOMY    . BUNIONECTOMY  06/27/2011   left , Dr Berline Lopes  . CATARACT EXTRACTION  2002   left eye   . CHOLECYSTECTOMY  1989  . ESOPHAGOGASTRODUODENOSCOPY  Jan  2007   prominent schatzki's ring, s/p 33F, small to moderate size hh  . EYE SURGERY  2005 approx   cataract left  . HIP SURGERY Left 12/20/2013  . INTRAMEDULLARY (IM) NAIL INTERTROCHANTERIC Left 01/02/2014   Procedure: INTRAMEDULLARY (IM) NAIL INTERTROCHANTRIC;  Surgeon: Newt Minion, MD;  Location: [facility];  Service: Orthopedics;  Laterality: Left;   Family History  Problem Relation Age of Onset  . Heart attack Father   . Diabetes Sister   . Diabetes Sister   . Diabetes Sister   . Parkinson's disease Sister   . Liver cancer Unknown        family history   . Heart disease Brother   . Prostate cancer Brother   . Colon cancer Neg Hx   .  Anesthesia problems Neg Hx   . Hypotension Neg Hx   . Malignant hyperthermia Neg Hx   . Pseudochol deficiency Neg Hx    Social History   Socioeconomic History  . Marital status: Widowed    Spouse name: Not on file  . Number of children: 1  . Years of education: Not on file  . Highest education level: Not on file  Occupational History  . Occupation: retired   Scientific laboratory technician  . Financial resource strain: Not hard at all  . Food insecurity:    Worry: Never true    Inability: Never true  . Transportation needs:    Medical: No    Non-medical: No  Tobacco Use  . Smoking status: Never Smoker  . Smokeless tobacco: Never Used  Substance and Sexual Activity  . Alcohol use: No  . Drug use: No  . Sexual  activity: Never  Lifestyle  . Physical activity:    Days per week: 3 days    Minutes per session: 30 min  . Stress: Not at all  Relationships  . Social connections:    Talks on phone: More than three times a week    Gets together: More than three times a week    Attends religious service: More than 4 times per year    Active member of club or organization: No    Attends meetings of clubs or organizations: Never    Relationship status: Widowed  Other Topics Concern  . Not on file  Social History Narrative   GOES TO [place] DURING THE WEEK DAILY.     Outpatient Encounter Medications as of 11/12/2017  Medication Sig  . calcium carbonate (TUMS EX) 750 MG chewable tablet Chew 2 tablets by mouth daily.   . Cholecalciferol (VITAMIN D PO) Take 1 tablet by mouth daily.  Marland Kitchen lisinopril (PRINIVIL,ZESTRIL) 5 MG tablet TAKE 1 TABLET BY MOUTH ONCE DAILY.  Marland Kitchen lovastatin (MEVACOR) 40 MG tablet TAKE (1) TABLET BY MOUTH AT BEDTIME.  Marland Kitchen omeprazole (PRILOSEC) 20 MG capsule TAKE (1) CAPSULE BY MOUTH AT BEDTIME.  . ranitidine (ZANTAC) 150 MG tablet Take 1 tablet (150 mg total) by mouth 2 (two) times daily.  . [DISCONTINUED] Melatonin 300 MCG TABS Take 1 tablet by mouth at bedtime.   No facility-administered encounter medications on file as of 11/12/2017.     Activities of Daily Living In your present state of health, do you have any difficulty performing the following activities: 11/12/2017 12/17/2016  Hearing? [place]? N N  Difficulty concentrating or making decisions? Y Y  Comment "DEPENDS ON WHAT IT IS" -  Walking or climbing stairs? N Y  Dressing or bathing? N Y  Doing errands, shopping? N Y  Comment DAUGHTER DRIVES HER -  Preparing Food and eating ? N -  Using the Toilet? N -  In the past six months, have you accidently leaked urine? N -  Do you have problems with loss of bowel control? N -  Managing your Medications? Y -  Comment DAUGHTER PUTS  MEDICINE IN A PILL BOX AND PATIENT TAKES MEDICINE ON HER OWN -  Managing your Finances? Y -  Comment DAUGHTER DOES IT -  Housekeeping or managing your Housekeeping? N -  Some recent data might be hidden    Patient Care Team: Fayrene Helper, MD as PCP - General Rourk, Cristopher Estimable, MD (Gastroenterology) McKenzie, Candee Furbish, MD as Consulting Physician (Urology) Cristal Deer,  DPM as Attending Physician (Podiatry) Carole Civil, MD as Consulting Physician (Orthopedic Surgery) Leta Baptist, MD as Consulting Physician (Otolaryngology)    Assessment:   This is a routine wellness examination for [facility].  Exercise Activities and Dietary recommendations Current Exercise Habits: Structured exercise class, Type of exercise: stretching, Time (Minutes): 30, Frequency (Times/Week): 3, Weekly Exercise (Minutes/Week): 90, Intensity: Mild, Exercise limited by: None identified  Goals    None      Fall Risk Fall Risk  11/12/2017 01/23/2017 12/26/2016 07/20/2016 07/20/2016  Falls in the past year? Yes No No No Yes  Number falls in past yr: 1 - - - 1  Injury with Fall? No - - - Yes  Risk Factor Category  High Fall Risk - - - -  Risk for fall due to : Impaired balance/gait;History of fall(s) - - - -   Is the patient's home free of loose throw rugs in walkways, pet beds, electrical cords, etc?   yes      Grab bars in the bathroom? yes      Handrails on the stairs?   yes      Adequate lighting?   yes  Timed Get Up and Go performed: No  Depression Screen PHQ 2/9 Scores 11/12/2017 01/23/2017 12/26/2016 07/20/2016  PHQ - 2 Score 0 0 0 0  PHQ- 9 Score - - - -     Cognitive Function MMSE - Mini Mental State Exam 07/20/2016 07/13/2014  Orientation to time 3 5  Orientation to Place 5 5  Registration 3 3  Attention/ Calculation 5 5  Recall 1 2  Language- name 2 objects 2 2  Language- repeat 1 1  Language- follow 3 step command 3 3  Language- read & follow direction 1 1  Write a sentence 1 1  Copy  design 1 1  Total score 26 29     6CIT Screen 11/12/2017  What Year? 0 points  What month? 0 points  What time? 0 points  Count back from 20 0 points  Months in reverse 4 points  Repeat phrase 4 points  Total Score 8    Immunization History  Administered Date(s) Administered  . Influenza Split 02/22/2011, 02/14/2012  . Influenza,inj,Quad PF,6+ Mos 03/05/2013, 03/09/2014, 03/09/2015, 01/23/2017  . PPD Test 01/01/2017  . Pneumococcal Conjugate-13 07/13/2014  . Pneumococcal Polysaccharide-23 09/12/2010  . Td 09/17/2008    Qualifies for Shingles Vaccine?UTD  Screening Tests Health Maintenance  Topic Date Due  . INFLUENZA VACCINE  12/20/2017  . TETANUS/TDAP  09/18/2018  . DEXA SCAN  Completed  . PNA vac Low Risk Adult  Completed    Cancer Screenings: Lung: Low Dose CT Chest recommended if Age 85-80 years, 30 pack-year currently smoking OR have quit w/in 15years. Patient does not qualify. Breast:  Up to date on Mammogram? No longer gets mammograms Up to date of Bone Density/Dexa? Yes Colorectal: No longer gets these  Additional Screenings:  Hepatitis C Screening: No     Plan:      I have personally reviewed and noted the following in the patient's chart:   . Medical and social history . Use of alcohol, tobacco or illicit drugs  . Current medications and supplements . Functional ability and status . Nutritional status . Physical activity . Advanced directives . List of other physicians . Hospitalizations, surgeries, and ER visits in previous 12 months . Vitals . Screenings to include cognitive, depression, and falls . Referrals and appointments  In addition, I have reviewed  and discussed with patient certain preventive protocols, quality metrics, and best practice recommendations. A written personalized care plan for preventive services as well as general preventive health recommendations were provided to patient.     Tod Persia,  [place]  11/12/2017

## 2017-11-12 NOTE — Patient Instructions (Signed)
Kristy Jarvis , Thank you for taking time to come for your Medicare Wellness Visit. I appreciate your ongoing commitment to your health goals. Please review the following plan we discussed and let me know if I can assist you in the future.   Screening recommendations/referrals: Colonoscopy: No longer needed Mammogram: You no longer get these Bone Density: Up to date Recommended yearly ophthalmology/optometry visit for glaucoma screening and checkup Recommended yearly dental visit for hygiene and checkup  Vaccinations: Influenza vaccine: Due in the Fall 2019 Pneumococcal vaccine: Up to date Tdap vaccine: Up to date Shingles vaccine: Up to date    Advanced directives: You already have these in place  Conditions/risks identified: Discussed these during your visit.   Next appointment: March 06, 2018 at 1:00 pm with Dr.Simpson. If you need [place] before then, please call.     Preventive Care 38 Years and Older, Female Preventive care refers to lifestyle choices and visits with your health care provider that can promote health and wellness. What does preventive care include?  A yearly physical exam. This is also called an annual well check.  Dental exams once or twice a year.  Routine eye exams. Ask your health care provider how often you should have your eyes checked.  Personal lifestyle choices, including:  Daily care of your teeth and gums.  Regular physical activity.  Eating a healthy diet.  Avoiding tobacco and drug use.  Limiting alcohol use.  Practicing safe sex.  Taking low-dose aspirin every day.  Taking vitamin and mineral supplements as recommended by your health care provider. What happens during an annual well check? The services and screenings done by your health care provider during your annual well check will depend on your age, overall health, lifestyle risk factors, and family history of disease. Counseling  Your health care provider may ask you questions  about your:  Alcohol use.  Tobacco use.  Drug use.  Emotional well-being.  Home and relationship well-being.  Sexual activity.  Eating habits.  History of falls.  Memory and ability to understand (cognition).  Work and work Statistician.  Reproductive health. Screening  You may have the following tests or measurements:  Height, weight, and BMI.  Blood pressure.  Lipid and cholesterol levels. These may be checked every 5 years, or more frequently if you are over 61 years old.  Skin check.  Lung cancer screening. You may have this screening every year starting at age 34 if you have a 30-pack-year history of smoking and currently smoke or have quit within the past 15 years.  Fecal occult blood test (FOBT) of the stool. You may have this test every year starting at age 71.  Flexible sigmoidoscopy or colonoscopy. You may have a sigmoidoscopy every 5 years or a colonoscopy every 10 years starting at age 36.  Hepatitis C blood test.  Hepatitis B blood test.  Sexually transmitted disease (STD) testing.  Diabetes screening. This is done by checking your blood sugar (glucose) after you have not eaten for a while (fasting). You may have this done every 1-3 years.  Bone density scan. This is done to screen for osteoporosis. You may have this done starting at age 71.  Mammogram. This may be done every 1-2 years. Talk to your health care provider about how often you should have regular mammograms. Talk with your health care provider about your test results, treatment options, and if necessary, the need for more tests. Vaccines  Your health care provider may recommend certain vaccines,  such as:  Influenza vaccine. This is recommended every year.  Tetanus, diphtheria, and acellular pertussis (Tdap, Td) vaccine. You may need a Td booster every 10 years.  Zoster vaccine. You may need this after age 66.  Pneumococcal 13-valent conjugate (PCV13) vaccine. One dose is  recommended after age 28.  Pneumococcal polysaccharide (PPSV23) vaccine. One dose is recommended after age 72. Talk to your health care provider about which screenings and vaccines you need and how often you need them. This information is not intended to replace advice given to you by your health care provider. Make sure you discuss any questions you have with your health care provider. Document Released: 06/04/2015 Document Revised: 01/26/2016 Document Reviewed: 03/09/2015 Elsevier Interactive Patient Education  2017 [place] Prevention in the Home Falls can cause injuries. They can happen to people of all ages. There are many things you can do to make your home safe and to help prevent falls. What can I do on the outside of my home?  Regularly fix the edges of walkways and driveways and fix any cracks.  Remove anything that might make you trip as you walk through a door, such as a raised step or threshold.  Trim any bushes or trees on the path to your home.  Use bright outdoor lighting.  Clear any walking paths of anything that might make someone trip, such as rocks or tools.  Regularly check to see if handrails are loose or broken. Make sure that both sides of any steps have handrails.  Any raised decks and porches should have guardrails on the edges.  Have any leaves, snow, or ice cleared regularly.  Use sand or salt on walking paths during winter.  Clean up any spills in your garage right away. This includes oil or grease spills. What can I do in the bathroom?  Use night lights.  Install grab bars by the toilet and in the tub and shower. Do not use towel bars as grab bars.  Use non-skid mats or decals in the tub or shower.  If you need to sit down in the shower, use a plastic, non-slip stool.  Keep the floor dry. Clean up any water that spills on the floor as soon as it happens.  Remove soap buildup in the tub or shower regularly.  Attach bath mats  securely with double-sided non-slip rug tape.  Do not have throw rugs and other things on the floor that can make you trip. What can I do in the bedroom?  Use night lights.  Make sure that you have a light by your bed that is easy to reach.  Do not use any sheets or blankets that are too big for your bed. They should not hang down onto the floor.  Have a firm chair that has side arms. You can use this for support while you get dressed.  Do not have throw rugs and other things on the floor that can make you trip. What can I do in the kitchen?  Clean up any spills right away.  Avoid walking on wet floors.  Keep items that you use a lot in easy-to-reach places.  If you need to reach something above you, use a strong step stool that has a grab bar.  Keep electrical cords out of the way.  Do not use floor polish or wax that makes floors slippery. If you must use wax, use non-skid floor wax.  Do not have throw rugs and other things on  the floor that can make you trip. What can I do with my stairs?  Do not leave any items on the stairs.  Make sure that there are handrails on both sides of the stairs and use them. Fix handrails that are broken or loose. Make sure that handrails are as long as the stairways.  Check any carpeting to make sure that it is firmly attached to the stairs. Fix any carpet that is loose or worn.  Avoid having throw rugs at the top or bottom of the stairs. If you do have throw rugs, attach them to the floor with carpet tape.  Make sure that you have a light switch at the top of the stairs and the bottom of the stairs. If you do not have them, ask someone to add them for you. What else can I do to help prevent falls?  Wear shoes that:  Do not have high heels.  Have rubber bottoms.  Are comfortable and fit you well.  Are closed at the toe. Do not wear sandals.  If you use a stepladder:  Make sure that it is fully opened. Do not climb a closed  stepladder.  Make sure that both sides of the stepladder are locked into place.  Ask someone to hold it for you, if possible.  Clearly mark and make sure that you can see:  Any grab bars or handrails.  First and last steps.  Where the edge of each step is.  Use tools that help you move around (mobility aids) if they are needed. These include:  Canes.  Walkers.  Scooters.  Crutches.  Turn on the lights when you go into a dark area. Replace any light bulbs as soon as they burn out.  Set up your furniture so you have a clear path. Avoid moving your furniture around.  If any of your floors are uneven, fix them.  If there are any pets around you, be aware of where they are.  Review your medicines with your doctor. Some medicines can make you feel dizzy. This can increase your chance of falling. Ask your doctor what other things that you can do to help prevent falls. This information is not intended to replace advice given to you by your health care provider. Make sure you discuss any questions you have with your health care provider. Document Released: 03/04/2009 Document Revised: 10/14/2015 Document Reviewed: 06/12/2014 Elsevier Interactive Patient Education  2017 [employer].

## 2017-12-10 ENCOUNTER — Telehealth: Payer: Self-pay | Admitting: Family Medicine

## 2017-12-10 NOTE — Telephone Encounter (Signed)
Patients caregiver anita (phone#[phone]) Dropped off paper to be completed for [facility].  Copied, stamped (copy), and placed in brown folder. Original placed in Dr.Simpson's box.

## 2018-01-14 ENCOUNTER — Other Ambulatory Visit: Payer: Self-pay

## 2018-01-14 NOTE — Patient Outreach (Signed)
[facility]) Care Management  01/14/2018  Kristy Jarvis 31-Oct-1920 [id number]   Medication Adherence call to Kristy Jarvis patient did not answer patient is due on Lovastatin 40 mg and Lisinopril 5 mg. Kristy Jarvis is showing past due under [place].   [place] Management Direct Dial [phone]  Fax [phone] [email or web address]

## 2018-01-16 ENCOUNTER — Other Ambulatory Visit: Payer: Self-pay | Admitting: Family Medicine

## 2018-02-07 ENCOUNTER — Other Ambulatory Visit: Payer: Self-pay | Admitting: Family Medicine

## 2018-03-04 ENCOUNTER — Encounter: Payer: Medicare Other | Admitting: Family Medicine

## 2018-03-06 ENCOUNTER — Ambulatory Visit (INDEPENDENT_AMBULATORY_CARE_PROVIDER_SITE_OTHER): Payer: Medicare Other | Admitting: Family Medicine

## 2018-03-06 ENCOUNTER — Encounter: Payer: Self-pay | Admitting: Family Medicine

## 2018-03-06 VITALS — BP 130/60 | HR 73 | Resp 12 | Ht 62.0 in | Wt 101.0 lb

## 2018-03-06 DIAGNOSIS — I10 Essential (primary) hypertension: Secondary | ICD-10-CM

## 2018-03-06 DIAGNOSIS — E785 Hyperlipidemia, unspecified: Secondary | ICD-10-CM

## 2018-03-06 DIAGNOSIS — Z0000 Encounter for general adult medical examination without abnormal findings: Secondary | ICD-10-CM

## 2018-03-06 DIAGNOSIS — E559 Vitamin D deficiency, unspecified: Secondary | ICD-10-CM

## 2018-03-06 DIAGNOSIS — Z23 Encounter for immunization: Secondary | ICD-10-CM

## 2018-03-06 NOTE — Assessment & Plan Note (Signed)
After obtaining informed consent, the vaccine is  administered by LPN.  

## 2018-03-06 NOTE — Assessment & Plan Note (Addendum)
Annual exam as documented. Counseling done  re healthy lifestyle involving commitment to 150 minutes exercise per week, heart healthy diet, and attaining healthy weight.The importance of adequate sleep also discussed. . Immunization  needs are specifically addressed at this visit.  

## 2018-03-06 NOTE — Progress Notes (Signed)
    Kristy Jarvis     MRN: [id number]      DOB: Nov 22, 1920  HPI: Patient is in for annual physical exam. No other health concerns are expressed or addressed at the visit.  Immunization is reviewed , and  updated    PE: BP 130/60 (BP Location: Right Arm, Patient Position: Sitting, Cuff Size: Normal)   Pulse 73   Resp 12   Ht 5\' 2"  (1.575 m)   Wt 101 lb (45.8 kg)   SpO2 96% Comment: room air  BMI 18.47 kg/m   Pleasant  female, alert and oriented x 3, in no cardio-pulmonary distress. Afebrile. HEENT No facial trauma or asymetry. Sinuses non tender.  Extra occullar muscles intact,  External ears normal, tympanic membranes clear. Oropharynx moist, no exudate.Poor dentition Neck: decreased though adequate ROM, no adenopathy,JVD or thyromegaly.No bruits.  Chest: Clear to ascultation bilaterally.No crackles or wheezes. Non tender to palpation  Breast: No asymetry,no masses or lumps. No tenderness. No nipple discharge or inversion. No axillary or supraclavicular adenopathy  Cardiovascular system; Heart sounds normal,  S1 and  S2 ,no S3.  No murmur, or thrill. Apical beat not displaced Peripheral pulses normal.  Abdomen: Soft, non tender, no organomegaly or masses. No bruits. Bowel sounds normal. No guarding, tenderness or rebound.    Musculoskeletal exam: Decreased  ROM of spine, hips , shoulders and knees. No deformity ,swelling or crepitus noted. No muscle wasting or atrophy.   Neurologic: Cranial nerves 2 to 12 intact. Power, tone ,sensation and reflexes normal throughout. No disturbance in gait. No tremor.  Skin: Intact, no ulceration, erythema , scaling or rash noted. Pigmentation normal throughout Onychomycosis of nails  Psych; Normal mood and affect. Judgement and concentration normal   Assessment & Plan:  Annual physical exam Annual exam as documented. Counseling done  re healthy lifestyle involving commitment to 150 minutes exercise per  week, heart healthy diet, and attaining healthy weight.The importance of adequate sleep also discussed.  Immunization  needs are specifically addressed at this visit.   Need for immunization against influenza After obtaining informed consent, the vaccine is  administered by LPN.

## 2018-03-06 NOTE — Patient Instructions (Addendum)
Wellness  With nurse in May, please schedule  Annual physical exam with MD first week in November, 2020, please schedule  Fasting lipid, cmp and eGfR, TSH and Vit D in the next week  Be careful not to fall    Thank you  for choosing [facility]. We consider it a privelige to serve you.  Delivering excellent health care in a caring and  compassionate way is our goal.  Partnering with you,  so that together we can achieve this goal is our strategy.

## 2018-03-12 DIAGNOSIS — Z0000 Encounter for general adult medical examination without abnormal findings: Secondary | ICD-10-CM | POA: Diagnosis not present

## 2018-03-12 DIAGNOSIS — E559 Vitamin D deficiency, unspecified: Secondary | ICD-10-CM | POA: Diagnosis not present

## 2018-03-12 DIAGNOSIS — E785 Hyperlipidemia, unspecified: Secondary | ICD-10-CM | POA: Diagnosis not present

## 2018-03-12 DIAGNOSIS — I10 Essential (primary) hypertension: Secondary | ICD-10-CM | POA: Diagnosis not present

## 2018-03-13 LAB — COMPLETE METABOLIC PANEL WITH GFR
AG RATIO: 1.7 (calc) (ref 1.0–2.5)
ALBUMIN MSPROF: 4.4 g/dL (ref 3.6–5.1)
ALKALINE PHOSPHATASE (APISO): 66 U/L (ref 33–130)
ALT: 12 U/L (ref 6–29)
AST: 23 U/L (ref 10–35)
BILIRUBIN TOTAL: 0.7 mg/dL (ref 0.2–1.2)
BUN / CREAT RATIO: 10 (calc) (ref 6–22)
BUN: 13 mg/dL (ref 7–25)
CHLORIDE: 105 mmol/L (ref 98–110)
CO2: 28 mmol/L (ref 20–32)
Calcium: 10.2 mg/dL (ref 8.6–10.4)
Creat: 1.29 mg/dL — ABNORMAL HIGH (ref 0.60–0.88)
GFR, EST AFRICAN AMERICAN: 41 mL/min/{1.73_m2} — AB (ref >=60)
GFR, Est Non African American: 35 mL/min/{1.73_m2} — ABNORMAL LOW (ref >=60)
GLOBULIN: 2.6 g/dL (ref 1.9–3.7)
GLUCOSE: 94 mg/dL (ref 65–99)
Potassium: 4.1 mmol/L (ref 3.5–5.3)
SODIUM: 145 mmol/L (ref 135–146)
TOTAL PROTEIN: 7 g/dL (ref 6.1–8.1)

## 2018-03-13 LAB — LIPID PANEL
CHOLESTEROL: 180 mg/dL (ref <200)
HDL: 77 mg/dL (ref >50)
LDL CHOLESTEROL (CALC): 80 mg/dL
Non-HDL Cholesterol (Calc): 103 mg/dL (calc) (ref <130)
TRIGLYCERIDES: 129 mg/dL (ref <150)
Total CHOL/HDL Ratio: 2.3 (calc) (ref <5.0)

## 2018-03-13 LAB — TSH: TSH: 1.35 m[IU]/L (ref 0.40–4.50)

## 2018-03-13 LAB — VITAMIN D 25 HYDROXY (VIT D DEFICIENCY, FRACTURES): VIT D 25 HYDROXY: 31 ng/mL (ref 30–100)

## 2018-03-20 ENCOUNTER — Other Ambulatory Visit: Payer: Self-pay | Admitting: Family Medicine

## 2018-04-15 ENCOUNTER — Other Ambulatory Visit: Payer: Self-pay

## 2018-04-15 NOTE — Patient Outreach (Signed)
[facility]) Care Management  04/15/2018  Kristy Jarvis 17-May-1921 [id number]   Medication Adherence call to Mrs. Kristy Jarvis patient did not answer patient is due on Lisinopril 5 mg. Mrs. Kristy Jarvis is showing past due under [place].   [place] Management Direct Dial [phone]  Fax [phone] [email or web address]

## 2018-05-27 ENCOUNTER — Telehealth: Payer: Self-pay | Admitting: Family Medicine

## 2018-05-27 MED ORDER — FAMOTIDINE 20 MG PO TABS: 20.0000 mg | ORAL_TABLET | Freq: Every day | ORAL | 4 refills | Status: DC

## 2018-05-27 NOTE — Telephone Encounter (Signed)
pls call and advise her [name], that needs to d/c the zantac due to recall ,if she has not done so already, instead, needs to pecid, famotidine 20 mg daily, I have sent that in

## 2018-05-28 NOTE — Telephone Encounter (Signed)
Spoke with patients daughter she is aware.

## 2018-05-28 NOTE — Telephone Encounter (Signed)
Pt LVM that she was returning your call

## 2018-05-28 NOTE — Telephone Encounter (Signed)
Called both numbers, no answer, left a detailed message

## 2018-06-20 ENCOUNTER — Encounter: Payer: Self-pay | Admitting: *Deleted

## 2018-07-19 IMAGING — US US RENAL
1 series · 14 of 25 positions shown · non-contrast
Comparison: Abdominal CT scan dated November 24, 2015.

CLINICAL DATA: Followup kidney stones.

EXAM:
RENAL / URINARY TRACT ULTRASOUND COMPLETE

[Series 1: us renal · 0.21mm/px · 14 of 53 slices shown]
[im 1/53]
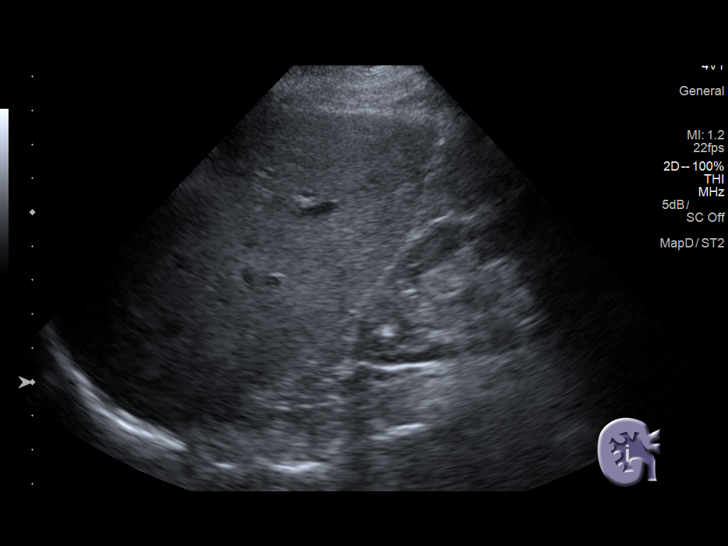
[im 5/53]
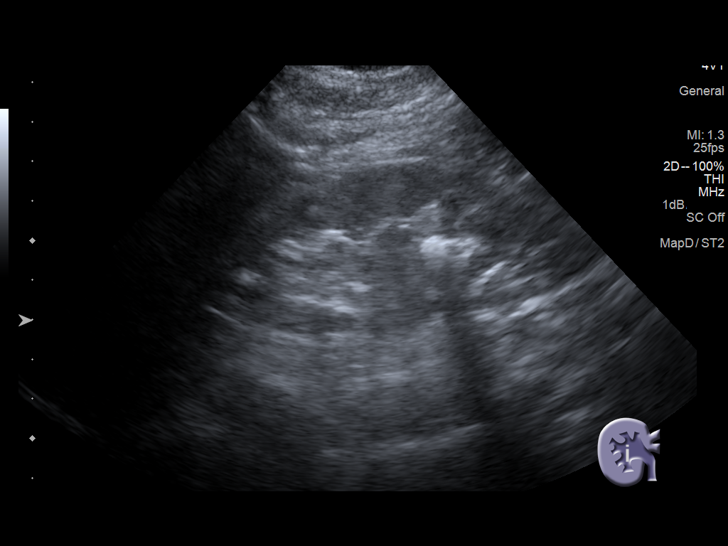
[im 9/53]
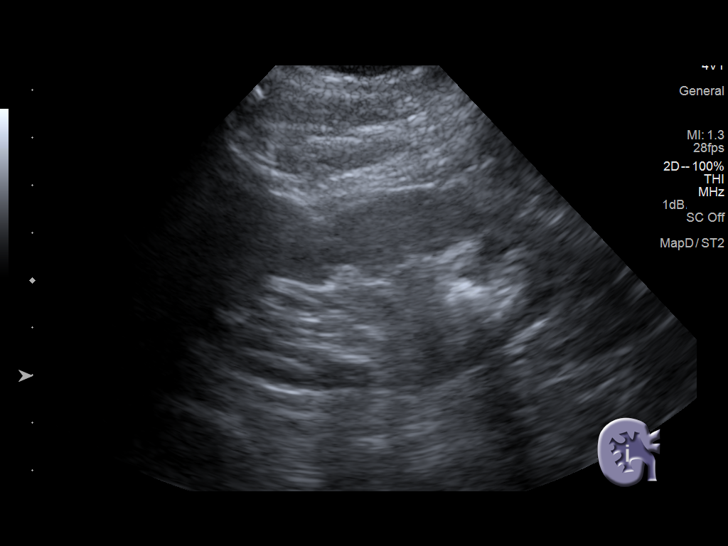
[im 14/53]
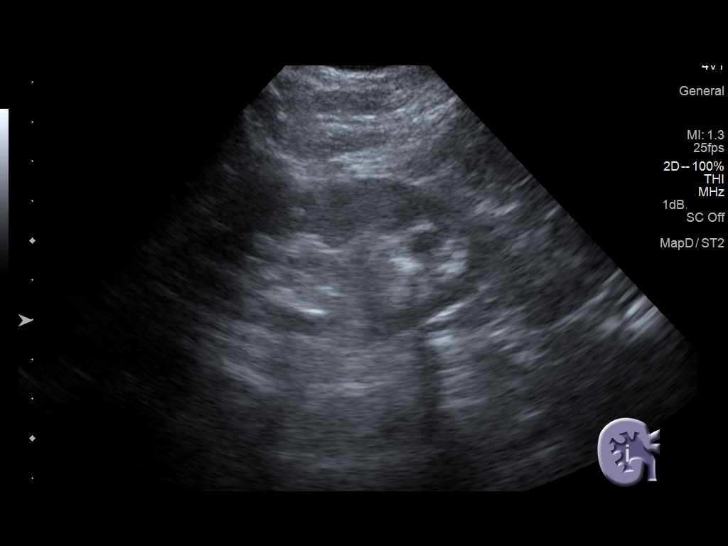
[im 18/53]
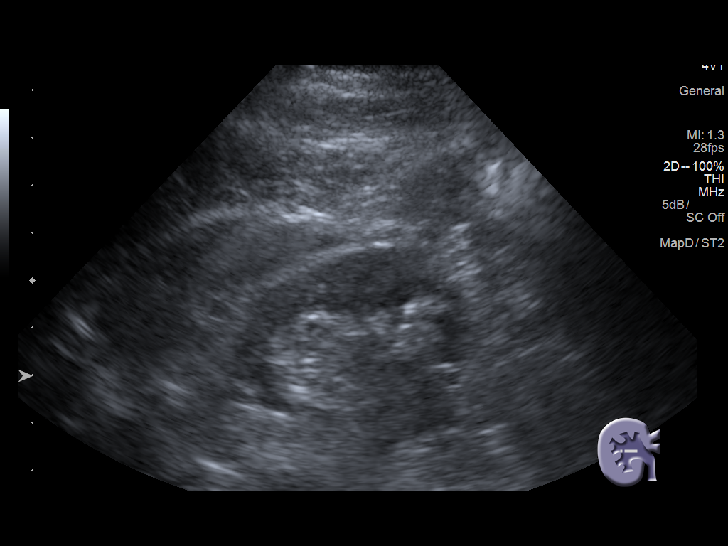
[im 20/53]
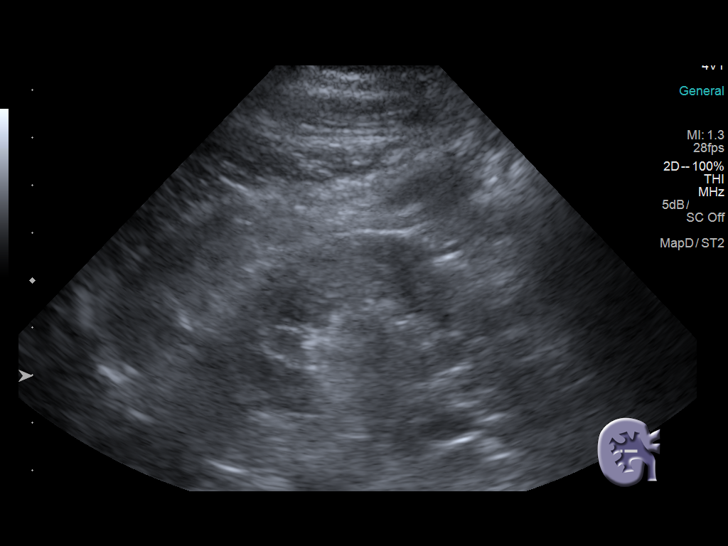
[im 24/53]
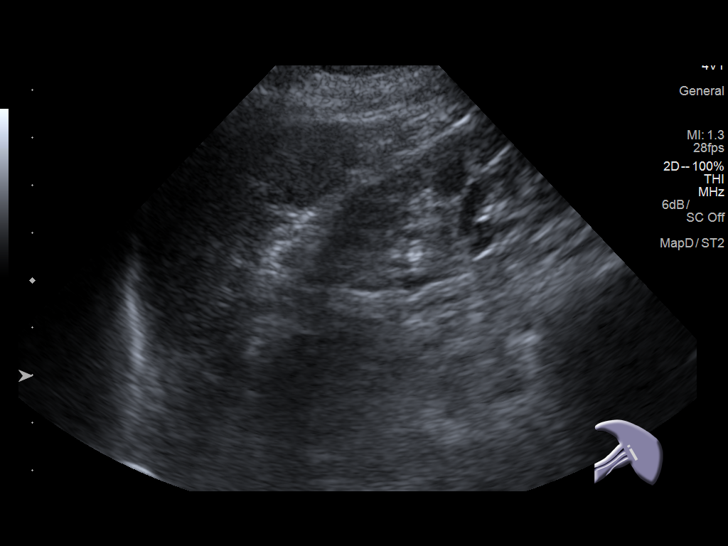
[im 29/53]
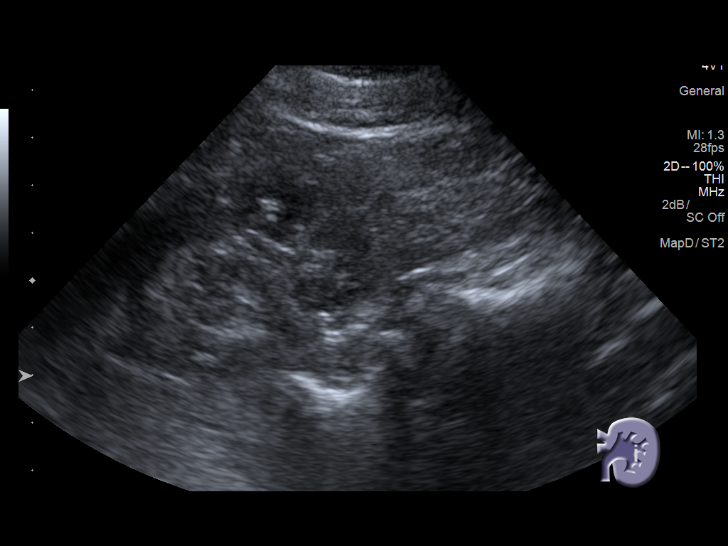
[im 33/53]
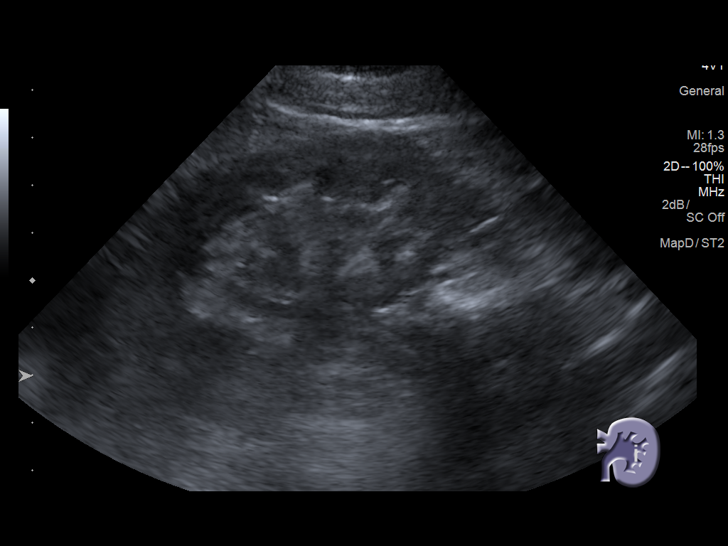
[im 35/53]
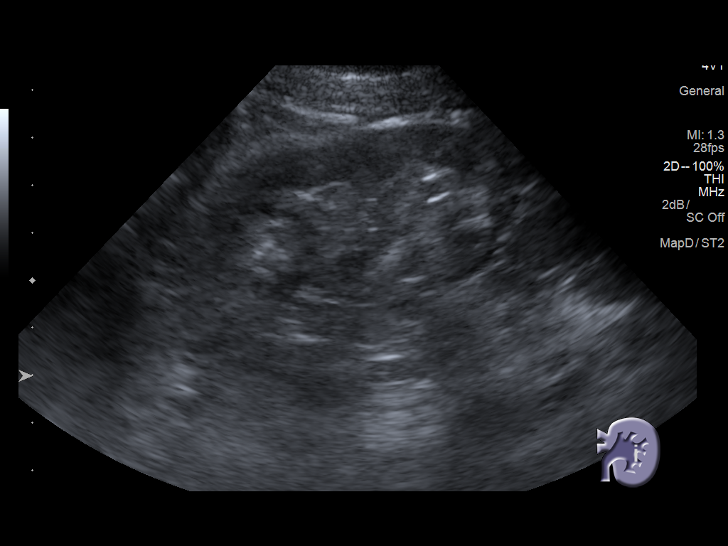
[im 40/53]
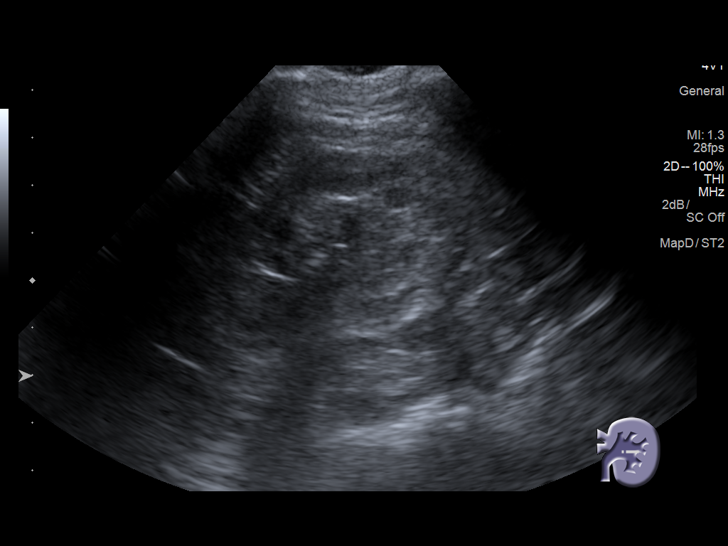
[im 44/53]
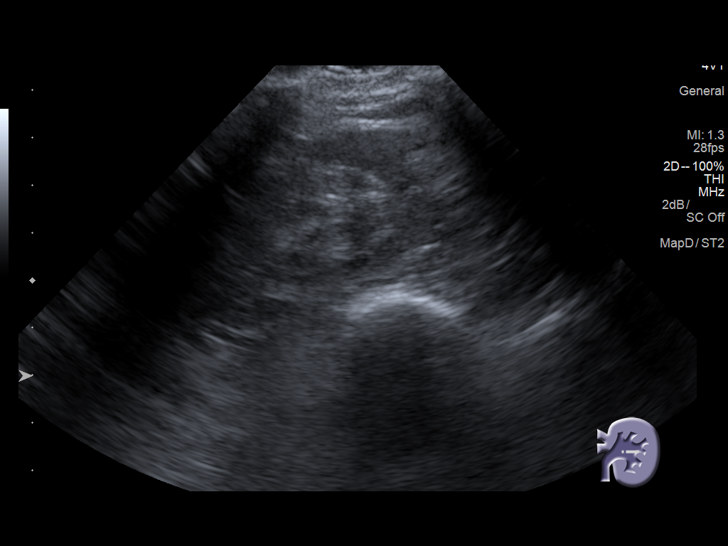
[im 48/53]
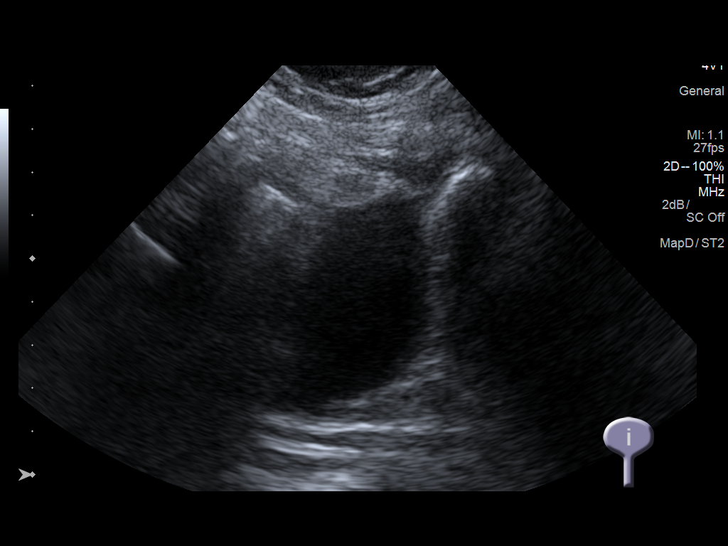
[im 53/53]
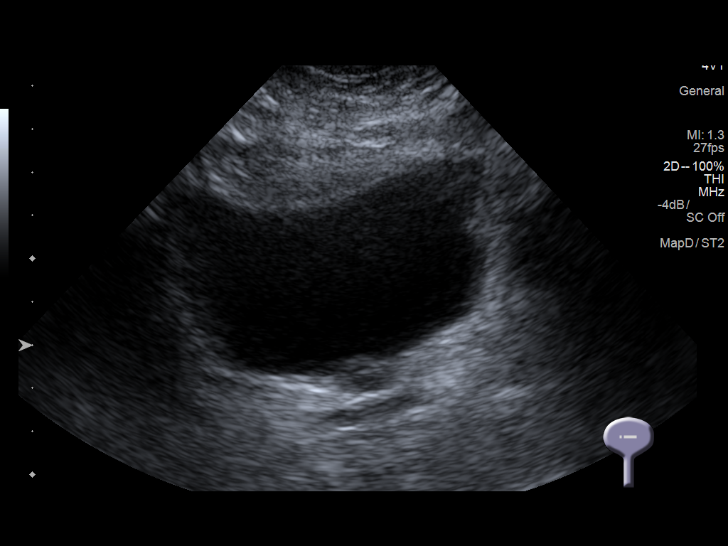

[14 of 25 positions shown; findings below may reference images not displayed]

FINDINGS: Right Kidney:

Length: 8.3 cm. Echogenicity remains slightly lower than the
adjacent liver. No mass or hydronephrosis visualized. There is an 8
mm mid to lower pole non obstructing stone.

Left Kidney:

Length: 7.4 cm. Echogenicity within normal limits. No mass or
hydronephrosis visualized.

Bladder:

Appears normal for degree of bladder distention.
IMPRESSION: 8 mm nonobstructing right lower pole stone.

No left sided hydronephrosis.

## 2018-08-15 ENCOUNTER — Other Ambulatory Visit: Payer: Self-pay | Admitting: Family Medicine

## 2018-08-31 ENCOUNTER — Other Ambulatory Visit: Payer: Self-pay | Admitting: Family Medicine

## 2018-09-05 ENCOUNTER — Encounter: Payer: Self-pay | Admitting: *Deleted

## 2018-10-01 ENCOUNTER — Other Ambulatory Visit: Payer: Self-pay | Admitting: Family Medicine

## 2018-10-01 ENCOUNTER — Ambulatory Visit: Payer: Medicare Other

## 2018-11-14 ENCOUNTER — Ambulatory Visit: Payer: Medicare Other | Admitting: Family Medicine

## 2018-11-14 ENCOUNTER — Other Ambulatory Visit: Payer: Self-pay | Admitting: Family Medicine

## 2018-11-15 ENCOUNTER — Ambulatory Visit: Payer: Medicare Other

## 2018-11-15 ENCOUNTER — Other Ambulatory Visit: Payer: Self-pay

## 2018-11-15 NOTE — Patient Outreach (Signed)
[facility]) Care Management  11/15/2018  Kristy Jarvis [birth year] [id number]   Medication Adherence call to Kristy Jarvis  Patient did not answer pt is past due on Lisinopril 5 mg and Lovastatin 40 mg under [facility].   [facility] Management Direct Dial [phone]  Fax [phone] [email or web address]

## 2018-11-18 ENCOUNTER — Ambulatory Visit: Payer: Medicare Other

## 2018-11-19 ENCOUNTER — Ambulatory Visit (INDEPENDENT_AMBULATORY_CARE_PROVIDER_SITE_OTHER): Payer: Medicare Other | Admitting: Family Medicine

## 2018-11-19 ENCOUNTER — Other Ambulatory Visit: Payer: Self-pay

## 2018-11-19 ENCOUNTER — Encounter: Payer: Self-pay | Admitting: Family Medicine

## 2018-11-19 VITALS — BP 128/68 | HR 62 | Temp 98.1°F | Resp 12 | Ht 62.0 in | Wt 97.1 lb

## 2018-11-19 DIAGNOSIS — Z0000 Encounter for general adult medical examination without abnormal findings: Secondary | ICD-10-CM

## 2018-11-19 NOTE — Patient Instructions (Addendum)
Kristy Jarvis , Thank you for taking time to come for your Medicare Wellness Visit. I appreciate your ongoing commitment to your health goals. Please review the following plan we discussed and let me know if I can assist you in the future.   Screening recommendations/referrals: Colonoscopy: No longer needed  Mammogram: Not needed Bone Density: Not needed Recommended yearly ophthalmology/optometry visit for glaucoma screening and checkup Recommended yearly dental visit for hygiene and checkup  Vaccinations: Influenza vaccine: Due Fall 2020 Pneumococcal vaccine: Completed  Tdap vaccine: Needs up date Shingles vaccine: Unsure if had  Advanced directives: POA, unsure if HPOA   Conditions/risks identified: Falls  Next appointment: 03/24/2019    Preventive Care 44 Years and Older, Female Preventive care refers to lifestyle choices and visits with your health care provider that can promote health and wellness. What does preventive care include?  A yearly physical exam. This is also called an annual well check.  Dental exams once or twice a year.  Routine eye exams. Ask your health care provider how often you should have your eyes checked.  Personal lifestyle choices, including:  Daily care of your teeth and gums.  Regular physical activity.  Eating a healthy diet.  Avoiding tobacco and drug use.  Limiting alcohol use.  Practicing safe sex.  Taking low-dose aspirin every day.  Taking vitamin and mineral supplements as recommended by your health care provider. What happens during an annual well check? The services and screenings done by your health care provider during your annual well check will depend on your age, overall health, lifestyle risk factors, and family history of disease. Counseling  Your health care provider may ask you questions about your:  Alcohol use.  Tobacco use.  Drug use.  Emotional well-being.  Home and relationship well-being.  Sexual  activity.  Eating habits.  History of falls.  Memory and ability to understand (cognition).  Work and work Statistician.  Reproductive health. Screening  You may have the following tests or measurements:  Height, weight, and BMI.  Blood pressure.  Lipid and cholesterol levels. These may be checked every 5 years, or more frequently if you are over 91 years old.  Skin check.  Lung cancer screening. You may have this screening every year starting at age 45 if you have a 30-pack-year history of smoking and currently smoke or have quit within the past 15 years.  Fecal occult blood test (FOBT) of the stool. You may have this test every year starting at age 17.  Flexible sigmoidoscopy or colonoscopy. You may have a sigmoidoscopy every 5 years or a colonoscopy every 10 years starting at age 30.  Hepatitis C blood test.  Hepatitis B blood test.  Sexually transmitted disease (STD) testing.  Diabetes screening. This is done by checking your blood sugar (glucose) after you have not eaten for a while (fasting). You may have this done every 1-3 years.  Bone density scan. This is done to screen for osteoporosis. You may have this done starting at age 35.  Mammogram. This may be done every 1-2 years. Talk to your health care provider about how often you should have regular mammograms. Talk with your health care provider about your test results, treatment options, and if necessary, the need for more tests. Vaccines  Your health care provider may recommend certain vaccines, such as:  Influenza vaccine. This is recommended every year.  Tetanus, diphtheria, and acellular pertussis (Tdap, Td) vaccine. You may need a Td booster every 10 years.  Zoster  vaccine. You may need this after age 19.  Pneumococcal 13-valent conjugate (PCV13) vaccine. One dose is recommended after age 38.  Pneumococcal polysaccharide (PPSV23) vaccine. One dose is recommended after age 98. Talk to your health care  provider about which screenings and vaccines you need and how often you need them. This information is not intended to replace advice given to you by your health care provider. Make sure you discuss any questions you have with your health care provider. Document Released: 06/04/2015 Document Revised: 01/26/2016 Document Reviewed: 03/09/2015 Elsevier Interactive Patient Education  2017 [place] Prevention in the Home Falls can cause injuries. They can happen to people of all ages. There are many things you can do to make your home safe and to help prevent falls. What can I do on the outside of my home?  Regularly fix the edges of walkways and driveways and fix any cracks.  Remove anything that might make you trip as you walk through a door, such as a raised step or threshold.  Trim any bushes or trees on the path to your home.  Use bright outdoor lighting.  Clear any walking paths of anything that might make someone trip, such as rocks or tools.  Regularly check to see if handrails are loose or broken. Make sure that both sides of any steps have handrails.  Any raised decks and porches should have guardrails on the edges.  Have any leaves, snow, or ice cleared regularly.  Use sand or salt on walking paths during winter.  Clean up any spills in your garage right away. This includes oil or grease spills. What can I do in the bathroom?  Use night lights.  Install grab bars by the toilet and in the tub and shower. Do not use towel bars as grab bars.  Use non-skid mats or decals in the tub or shower.  If you need to sit down in the shower, use a plastic, non-slip stool.  Keep the floor dry. Clean up any water that spills on the floor as soon as it happens.  Remove soap buildup in the tub or shower regularly.  Attach bath mats securely with double-sided non-slip rug tape.  Do not have throw rugs and other things on the floor that can make you trip. What can I do in  the bedroom?  Use night lights.  Make sure that you have a light by your bed that is easy to reach.  Do not use any sheets or blankets that are too big for your bed. They should not hang down onto the floor.  Have a firm chair that has side arms. You can use this for support while you get dressed.  Do not have throw rugs and other things on the floor that can make you trip. What can I do in the kitchen?  Clean up any spills right away.  Avoid walking on wet floors.  Keep items that you use a lot in easy-to-reach places.  If you need to reach something above you, use a strong step stool that has a grab bar.  Keep electrical cords out of the way.  Do not use floor polish or wax that makes floors slippery. If you must use wax, use non-skid floor wax.  Do not have throw rugs and other things on the floor that can make you trip. What can I do with my stairs?  Do not leave any items on the stairs.  Make sure that there are handrails  on both sides of the stairs and use them. Fix handrails that are broken or loose. Make sure that handrails are as long as the stairways.  Check any carpeting to make sure that it is firmly attached to the stairs. Fix any carpet that is loose or worn.  Avoid having throw rugs at the top or bottom of the stairs. If you do have throw rugs, attach them to the floor with carpet tape.  Make sure that you have a light switch at the top of the stairs and the bottom of the stairs. If you do not have them, ask someone to add them for you. What else can I do to help prevent falls?  Wear shoes that:  Do not have high heels.  Have rubber bottoms.  Are comfortable and fit you well.  Are closed at the toe. Do not wear sandals.  If you use a stepladder:  Make sure that it is fully opened. Do not climb a closed stepladder.  Make sure that both sides of the stepladder are locked into place.  Ask someone to hold it for you, if possible.  Clearly mark and  make sure that you can see:  Any grab bars or handrails.  First and last steps.  Where the edge of each step is.  Use tools that help you move around (mobility aids) if they are needed. These include:  Canes.  Walkers.  Scooters.  Crutches.  Turn on the lights when you go into a dark area. Replace any light bulbs as soon as they burn out.  Set up your furniture so you have a clear path. Avoid moving your furniture around.  If any of your floors are uneven, fix them.  If there are any pets around you, be aware of where they are.  Review your medicines with your doctor. Some medicines can make you feel dizzy. This can increase your chance of falling. Ask your doctor what other things that you can do to help prevent falls. This information is not intended to replace advice given to you by your health care provider. Make sure you discuss any questions you have with your health care provider. Document Released: 03/04/2009 Document Revised: 10/14/2015 Document Reviewed: 06/12/2014 Elsevier Interactive Patient Education  2017 [employer].

## 2018-11-19 NOTE — Progress Notes (Signed)
Subjective:   Kristy Jarvis is a 85 y.o. female who presents for Medicare Annual (Subsequent) preventive examination.  Review of Systems:    Cardiac Risk Factors include: dyslipidemia;hypertension     Objective:     Vitals: BP 128/68   Pulse 62   Temp 98.1 F (36.7 C) (Temporal)   Resp 12   Ht 5\' 2"  (1.575 m)   Wt 97 lb 1.3 oz (44 kg)   SpO2 97%   BMI 17.76 kg/m   Body mass index is 17.76 kg/m.  Advanced Directives 11/12/2017 12/17/2016 12/17/2016 07/20/2016 11/24/2015 09/03/2015 02/23/2015  Does Patient Have a Medical Advance Directive? Yes Yes Yes Yes Yes Yes No  Type of Paramedic of [place];Living will Living will;Healthcare Power of Attorney Living will;Healthcare Power of [place];Living will Living will Living will;Healthcare Power of Attorney -  Does patient want to make changes to medical advance directive? No - Patient declined No - Patient declined - No - Patient declined - No - Patient declined -  Copy of [place] in Chart? Yes No - copy requested - Yes No - copy requested No - copy requested -  Would patient like information on creating a medical advance directive? - - - - - - No - patient declined information  Pre-existing out of facility DNR order (yellow form or pink MOST form) - - - - - - -    Tobacco Social History   Tobacco Use  Smoking Status Never Smoker  Smokeless Tobacco Never Used     Counseling given: Yes   Clinical Intake:  Pre-visit preparation completed: Yes  Pain : No/denies pain Pain Score: 0-No pain     BMI - recorded: 17.76 Nutritional Status: BMI <19  Underweight Nutritional Risks: None Diabetes: No  How often do you need to have someone help you when you read instructions, pamphlets, or other written materials from your doctor or pharmacy?: 1 - Never What is the last grade level you completed in school?: 12  Interpreter Needed?: No     Past Medical  History:  Diagnosis Date  . Cataract   . Diverticulosis of colon    Noted by CT  . GERD (gastroesophageal reflux disease)   . Hyperlipidemia   . Intermittent vertigo   . Osteoporosis   . Renal colic    Past Surgical History:  Procedure Laterality Date  . ABDOMINAL HYSTERECTOMY    . BUNIONECTOMY  06/27/2011   left , Dr Berline Lopes  . CATARACT EXTRACTION  2002   left eye   . CHOLECYSTECTOMY  1989  . ESOPHAGOGASTRODUODENOSCOPY  Jan  2007   prominent schatzki's ring, s/p 60F, small to moderate size hh  . EYE SURGERY  2005 approx   cataract left  . HIP SURGERY Left 12/20/2013  . INTRAMEDULLARY (IM) NAIL INTERTROCHANTERIC Left 01/02/2014   Procedure: INTRAMEDULLARY (IM) NAIL INTERTROCHANTRIC;  Surgeon: Newt Minion, MD;  Location: [facility];  Service: Orthopedics;  Laterality: Left;   Family History  Problem Relation Age of Onset  . Heart attack Father   . Diabetes Sister   . Diabetes Sister   . Diabetes Sister   . Parkinson's disease Sister   . Liver cancer Other        family history   . Heart disease Brother   . Prostate cancer Brother   . Colon cancer Neg Hx   . Anesthesia problems Neg Hx   . Hypotension Neg Hx   .  Malignant hyperthermia Neg Hx   . Pseudochol deficiency Neg Hx    Social History   Socioeconomic History  . Marital status: Widowed    Spouse name: Not on file  . Number of children: 1  . Years of education: Not on file  . Highest education level: Not on file  Occupational History  . Occupation: retired   Scientific laboratory technician  . Financial resource strain: Not hard at all  . Food insecurity    Worry: Never true    Inability: Never true  . Transportation needs    Medical: No    Non-medical: No  Tobacco Use  . Smoking status: Never Smoker  . Smokeless tobacco: Never Used  Substance and Sexual Activity  . Alcohol use: No  . Drug use: No  . Sexual activity: Never  Lifestyle  . Physical activity    Days per week: 3 days    Minutes per session: 30 min  . Stress:  Not at all  Relationships  . Social connections    Talks on phone: More than three times a week    Gets together: More than three times a week    Attends religious service: More than 4 times per year    Active member of club or organization: No    Attends meetings of clubs or organizations: Never    Relationship status: Widowed  Other Topics Concern  . Not on file  Social History Narrative   GOES TO [place] DURING THE WEEK DAILY.     Outpatient Encounter Medications as of 11/19/2018  Medication Sig  . calcium carbonate (TUMS EX) 750 MG chewable tablet Chew 2 tablets by mouth daily.   . Cholecalciferol (VITAMIN D PO) Take 1 tablet by mouth daily.  . famotidine (PEPCID) 20 MG tablet TAKE 1 TABLET BY MOUTH ONCE DAILY.  Marland Kitchen lisinopril (ZESTRIL) 5 MG tablet TAKE (1) TABLET BY MOUTH ONCE DAILY.  Marland Kitchen lovastatin (MEVACOR) 40 MG tablet TAKE (1) TABLET BY MOUTH AT BEDTIME.  Marland Kitchen omeprazole (PRILOSEC) 20 MG capsule TAKE (1) CAPSULE BY MOUTH AT BEDTIME.   No facility-administered encounter medications on file as of 11/19/2018.     Activities of Daily Living In your present state of health, do you have any difficulty performing the following activities: 11/19/2018  Hearing? Y  Vision? Y  Difficulty concentrating or making decisions? Y  Walking or climbing stairs? N  Dressing or bathing? N  Doing errands, shopping? Y  Comment or someone does her shopping for her  Preparing Food and eating ? N  Using the Toilet? N  In the past six months, have you accidently leaked urine? N  Do you have problems with loss of bowel control? N  Managing your Medications? Y  Managing your Finances? Y  Housekeeping or managing your Housekeeping? N  Some recent data might be hidden    Patient Care Team: Fayrene Helper, MD as PCP - General Rourk, Cristopher Estimable, MD (Gastroenterology) McKenzie, Candee Furbish, MD as Consulting Physician (Urology) Cristal Deer, DPM as Attending Physician (Podiatry)  Carole Civil, MD as Consulting Physician (Orthopedic Surgery) Leta Baptist, MD as Consulting Physician (Otolaryngology)    Assessment:   This is a routine wellness examination for [facility]  Exercise Activities and Dietary recommendations Current Exercise Habits: Home exercise routine, Type of exercise: calisthenics, Time (Minutes): 10, Frequency (Times/Week): 3, Weekly Exercise (Minutes/Week): 30, Intensity: Mild, Exercise limited by: None identified  Goals    . Increase water intake  Recommend increasing water intake to 5-6 glasses a day.       Fall Risk Fall Risk  11/19/2018 03/06/2018 11/12/2017 01/23/2017 12/26/2016  Falls in the past year? 1 No Yes No No  Number falls in past yr: 1 - 1 - -  Injury with Fall? 0 - No - -  Risk Factor Category  - - High Fall Risk - -  Risk for fall due to : - - Impaired balance/gait;History of fall(s) - -   Is the patient's home free of loose throw rugs in walkways, pet beds, electrical cords, etc?   yes      Grab bars in the bathroom? yes      Handrails on the stairs?   yes      Adequate lighting?   yes     Depression Screen PHQ 2/9 Scores 11/19/2018 03/06/2018 11/12/2017 01/23/2017  PHQ - 2 Score 0 0 0 0  PHQ- 9 Score - - - -     Cognitive Function MMSE - Mini Mental State Exam 07/20/2016 07/13/2014  Orientation to time 3 5  Orientation to Place 5 5  Registration 3 3  Attention/ Calculation 5 5  Recall 1 2  Language- name 2 objects 2 2  Language- repeat 1 1  Language- follow 3 step command 3 3  Language- read & follow direction 1 1  Write a sentence 1 1  Copy design 1 1  Total score 26 29     6CIT Screen 11/19/2018 11/12/2017  What Year? 0 points 0 points  What month? 0 points 0 points  What time? 0 points 0 points  Count back from 20 0 points 0 points  Months in reverse 4 points 4 points  Repeat phrase 0 points 4 points  Total Score 4 8    Immunization History  Administered Date(s) Administered  . Influenza Split  02/22/2011, 02/14/2012  . Influenza,inj,Quad PF,6+ Mos 03/05/2013, 03/09/2014, 03/09/2015, 01/23/2017, 03/06/2018  . PPD Test 01/01/2017  . Pneumococcal Conjugate-13 07/13/2014  . Pneumococcal Polysaccharide-23 09/12/2010  . Td 09/17/2008    Qualifies for Shingles Vaccine? Unsure if had prior   Screening Tests Health Maintenance  Topic Date Due  . TETANUS/TDAP  09/18/2018  . INFLUENZA VACCINE  12/21/2018  . DEXA SCAN  Completed  . PNA vac Low Risk Adult  Completed    Cancer Screenings: Lung: Low Dose CT Chest recommended if Age 55-80 years, 30 pack-year currently smoking OR have quit w/in 15years. Patient does not qualify. Breast:  Up to date on Mammogram? Yes   Up to date of Bone Density/Dexa? Yes Colorectal:  No longer needed   Additional Screenings:   Hepatitis C Screening: Can be added to next labs      Plan:      1. Encounter for Medicare annual wellness exam  I have personally reviewed and noted the following in the patient's chart:   . Medical and social history . Use of alcohol, tobacco or illicit drugs  . Current medications and supplements . Functional ability and status . Nutritional status . Physical activity . Advanced directives . List of other physicians . Hospitalizations, surgeries, and ER visits in previous 12 months . Vitals . Screenings to include cognitive, depression, and falls . Referrals and appointments  In addition, I have reviewed and discussed with patient certain preventive protocols, quality metrics, and best practice recommendations. A written personalized care plan for preventive services as well as general preventive health recommendations were provided to patient.  Perlie Mayo, NP  11/19/2018

## 2018-11-23 ENCOUNTER — Other Ambulatory Visit: Payer: Self-pay | Admitting: Family Medicine

## 2018-12-16 ENCOUNTER — Other Ambulatory Visit: Payer: Self-pay | Admitting: Family Medicine

## 2019-01-20 ENCOUNTER — Other Ambulatory Visit: Payer: Self-pay | Admitting: Family Medicine

## 2019-02-24 ENCOUNTER — Other Ambulatory Visit: Payer: Self-pay

## 2019-02-24 NOTE — Patient Outreach (Signed)
[facility]) Care Management  02/24/2019  [name] [birth year] [id number]   Medication Adherence call to Kristy Jarvis HIPPA Compliant Voice message left with a call back number. Spoke with patient she is past due on Lovastatin 40 mg and Lisinopril 5 mg patient explain she still has medication she has a pill box she fills every week. Patient will order when due. Kristy Jarvis is showing past due under [facility].   [facility] Management Direct Dial [phone]  Fax [phone] [email or web address]

## 2019-03-06 ENCOUNTER — Other Ambulatory Visit: Payer: Self-pay

## 2019-03-06 DIAGNOSIS — Z20822 Contact with and (suspected) exposure to covid-19: Secondary | ICD-10-CM

## 2019-03-07 LAB — NOVEL CORONAVIRUS, NAA: SARS-CoV-2, NAA: NOT DETECTED

## 2019-03-08 ENCOUNTER — Other Ambulatory Visit: Payer: Self-pay | Admitting: Family Medicine

## 2019-03-13 ENCOUNTER — Other Ambulatory Visit: Payer: Self-pay | Admitting: Family Medicine

## 2019-03-14 ENCOUNTER — Other Ambulatory Visit: Payer: Self-pay

## 2019-03-14 MED ORDER — LOVASTATIN 40 MG PO TABS: 40.0000 mg | ORAL_TABLET | Freq: Every day | ORAL | 0 refills | Status: DC

## 2019-03-24 ENCOUNTER — Ambulatory Visit (INDEPENDENT_AMBULATORY_CARE_PROVIDER_SITE_OTHER): Payer: Medicare Other | Admitting: Family Medicine

## 2019-03-24 ENCOUNTER — Encounter: Payer: Self-pay | Admitting: Family Medicine

## 2019-03-24 ENCOUNTER — Other Ambulatory Visit: Payer: Self-pay

## 2019-03-24 VITALS — BP 130/70 | HR 67 | Temp 97.9°F | Resp 15 | Ht 62.0 in | Wt 98.1 lb

## 2019-03-24 DIAGNOSIS — E785 Hyperlipidemia, unspecified: Secondary | ICD-10-CM

## 2019-03-24 DIAGNOSIS — E559 Vitamin D deficiency, unspecified: Secondary | ICD-10-CM

## 2019-03-24 DIAGNOSIS — I10 Essential (primary) hypertension: Secondary | ICD-10-CM

## 2019-03-24 DIAGNOSIS — M810 Age-related osteoporosis without current pathological fracture: Secondary | ICD-10-CM

## 2019-03-24 DIAGNOSIS — Z23 Encounter for immunization: Secondary | ICD-10-CM

## 2019-03-24 DIAGNOSIS — Z0000 Encounter for general adult medical examination without abnormal findings: Secondary | ICD-10-CM | POA: Diagnosis not present

## 2019-03-24 NOTE — Patient Instructions (Signed)
F/u in office with MD early May, call if you need me sooner  Flu vaccine today  No changes in medication at this time  Please get CBC, fasting lipid, cmp and EGFr, tSH and vit D on 03/28/2019, I will send a message with results  Keep staying safe, using your walker and enjoying the [facility]  HAPPY 95  on 05/06/2019 and best wishes for 2021!!

## 2019-03-24 NOTE — Assessment & Plan Note (Signed)

## 2019-03-24 NOTE — Progress Notes (Signed)
                                           `     Kristy Jarvis     MRN: [id number]      DOB: 03/19/1921  HPI: Patient is in for annual physical exam. No other health concerns are expressed or addressed at the visit. Recent labs, if available are reviewed. Immunization is reviewed , and  updated if needed.   PE: BP 130/70   Pulse 67   Temp 97.9 F (36.6 C) (Temporal)   Resp 15   Ht 5\' 2"  (1.575 m)   Wt 98 lb 1.3 oz (44.5 kg)   SpO2 92%   BMI 17.94 kg/m   Pleasant  female, alert and oriented x 3, in no cardio-pulmonary distress. Afebrile. HEENT No facial trauma or asymetry. Sinuses non tender.  Extra occullar muscles intact.. External ears normal, . Neck: supple, no adenopathy,JVD or thyromegaly.No bruits.  Chest: Clear to ascultation bilaterally.No crackles or wheezes. Non tender to palpation  Breast: No asymetry,no masses or lumps. No tenderness.  No axillary or supraclavicular adenopathy  Cardiovascular system; Heart sounds normal,  S1 and  S2 ,no S3.  No murmur, or thrill. Apical beat not displaced Peripheral pulses normal.  Abdomen: Soft, non tender,  No guarding, tenderness or rebound.      MS: adequate though reduced  ROM of spine, hips , shoulders and knees. No deformity ,swelling or crepitus noted. No muscle wasting or atrophy.   Neurologic: Cranial nerves 2 to 12 intact. Power, tone ,sensation and reflexes normal throughout. No disturbance in gait. No tremor.  Skin: Intact, no ulceration, erythema , scaling or rash noted. Pigmentation normal throughout  Psych; Normal mood and affect. Judgement and concentration normal   Assessment & Plan:  Annual physical exam Annual exam as documented. Counseling done  re healthy lifestyle involving commitment to 150 minutes exercise per week, heart healthy diet, and attaining healthy weight.The importance of adequate sleep also discussed. Regular seat belt use and home safety, is also discussed.  Changes in health habits are decided on by the patient with goals and time frames  set for achieving them. Immunization and cancer screening needs are specifically addressed at this visit.

## 2019-03-25 ENCOUNTER — Encounter: Payer: Self-pay | Admitting: Family Medicine

## 2019-03-28 DIAGNOSIS — E785 Hyperlipidemia, unspecified: Secondary | ICD-10-CM | POA: Diagnosis not present

## 2019-03-28 DIAGNOSIS — I10 Essential (primary) hypertension: Secondary | ICD-10-CM | POA: Diagnosis not present

## 2019-03-28 DIAGNOSIS — E559 Vitamin D deficiency, unspecified: Secondary | ICD-10-CM | POA: Diagnosis not present

## 2019-03-28 DIAGNOSIS — M810 Age-related osteoporosis without current pathological fracture: Secondary | ICD-10-CM | POA: Diagnosis not present

## 2019-03-29 LAB — COMPLETE METABOLIC PANEL WITH GFR
AG Ratio: 1.6 (calc) (ref 1.0–2.5)
ALT: 11 U/L (ref 6–29)
AST: 20 U/L (ref 10–35)
Albumin: 4 g/dL (ref 3.6–5.1)
Alkaline phosphatase (APISO): 65 U/L (ref 37–153)
BUN/Creatinine Ratio: 13 (calc) (ref 6–22)
BUN: 16 mg/dL (ref 7–25)
CO2: 29 mmol/L (ref 20–32)
Calcium: 9.8 mg/dL (ref 8.6–10.4)
Chloride: 110 mmol/L (ref 98–110)
Creat: 1.27 mg/dL — ABNORMAL HIGH (ref 0.60–0.88)
GFR, Est African American: 42 mL/min/{1.73_m2} — ABNORMAL LOW (ref >=60)
GFR, Est Non African American: 36 mL/min/{1.73_m2} — ABNORMAL LOW (ref >=60)
Globulin: 2.5 g/dL (calc) (ref 1.9–3.7)
Glucose, Bld: 95 mg/dL (ref 65–99)
Potassium: 4.3 mmol/L (ref 3.5–5.3)
Sodium: 144 mmol/L (ref 135–146)
Total Bilirubin: 0.5 mg/dL (ref 0.2–1.2)
Total Protein: 6.5 g/dL (ref 6.1–8.1)

## 2019-03-29 LAB — LIPID PANEL
Cholesterol: 181 mg/dL (ref <200)
HDL: 68 mg/dL (ref >=50)
LDL Cholesterol (Calc): 89 mg/dL (calc)
Non-HDL Cholesterol (Calc): 113 mg/dL (calc) (ref <130)
Total CHOL/HDL Ratio: 2.7 (calc) (ref <5.0)
Triglycerides: 142 mg/dL (ref <150)

## 2019-03-29 LAB — CBC
HCT: 41.8 % (ref 35.0–45.0)
Hemoglobin: 13.8 g/dL (ref 11.7–15.5)
MCH: 29.9 pg (ref 27.0–33.0)
MCHC: 33 g/dL (ref 32.0–36.0)
MCV: 90.7 fL (ref 80.0–100.0)
MPV: 11.9 fL (ref 7.5–12.5)
Platelets: 239 10*3/uL (ref 140–400)
RBC: 4.61 10*6/uL (ref 3.80–5.10)
RDW: 12.6 % (ref 11.0–15.0)
WBC: 6.3 10*3/uL (ref 3.8–10.8)

## 2019-03-29 LAB — TSH: TSH: 1.34 mIU/L (ref 0.40–4.50)

## 2019-03-29 LAB — VITAMIN D 25 HYDROXY (VIT D DEFICIENCY, FRACTURES): Vit D, 25-Hydroxy: 40 ng/mL (ref 30–100)

## 2019-03-31 ENCOUNTER — Encounter: Payer: Self-pay | Admitting: Family Medicine

## 2019-04-01 ENCOUNTER — Other Ambulatory Visit: Payer: Self-pay

## 2019-04-01 MED ORDER — LISINOPRIL 5 MG PO TABS: ORAL_TABLET | ORAL | 1 refills | Status: DC

## 2019-04-02 IMAGING — US US RENAL
1 series · 14 of 25 positions shown · non-contrast
Comparison: 02/29/2016 plain film exam.  11/24/2015 CT.

CLINICAL DATA: [AGE] female with history kidney stones.
Chronic kidney disease stage 3. Initial encounter.

EXAM:
RENAL / URINARY TRACT ULTRASOUND COMPLETE

[Series 1: us renal · 0.20mm/px · 14 of 57 slices shown]
[im 1/57]
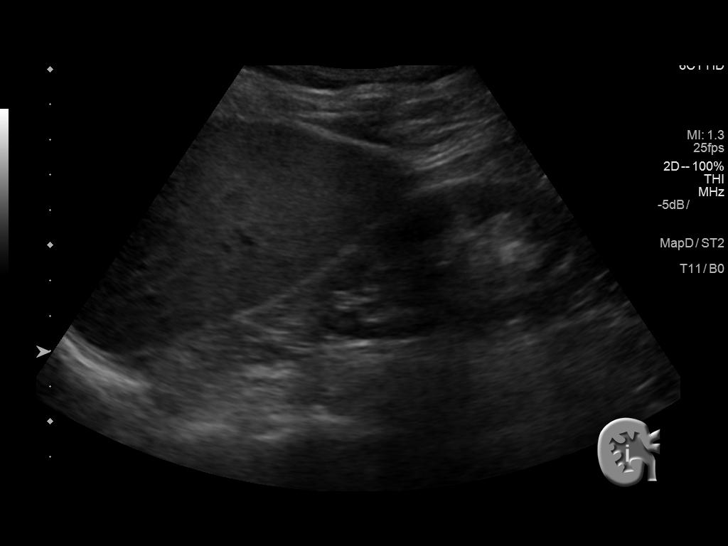
[im 5/57]
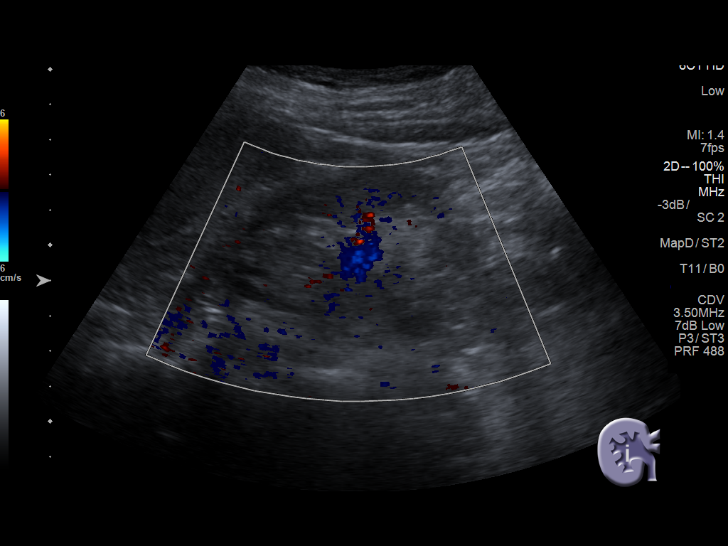
[im 10/57]
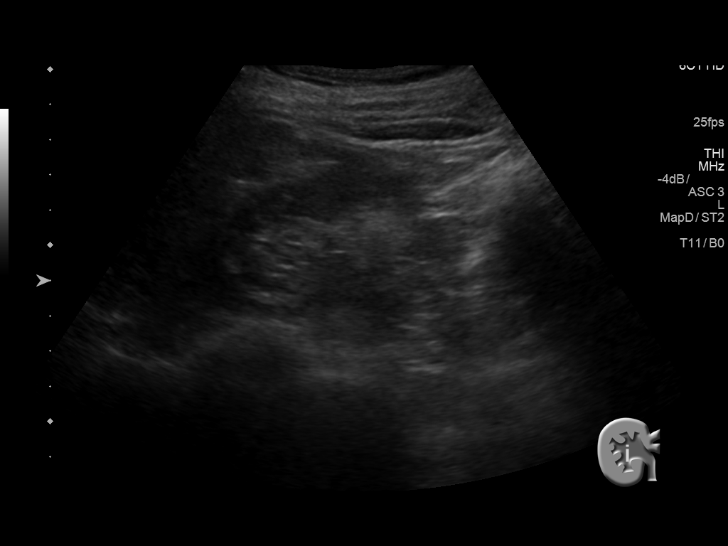
[im 15/57]
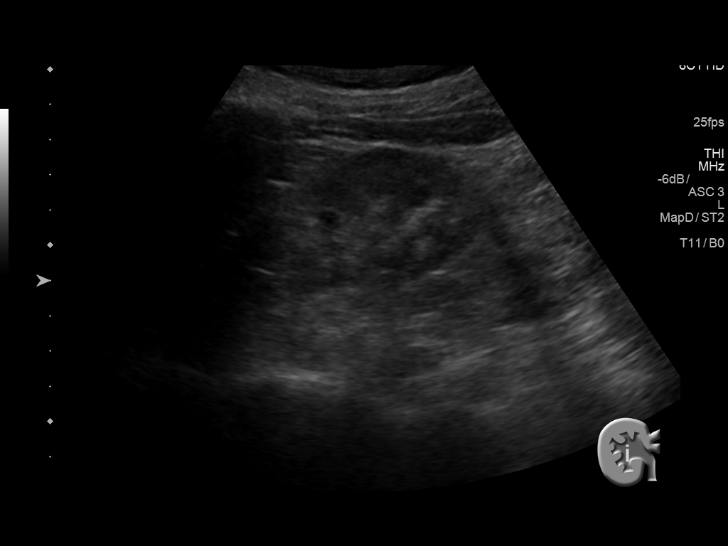
[im 19/57]
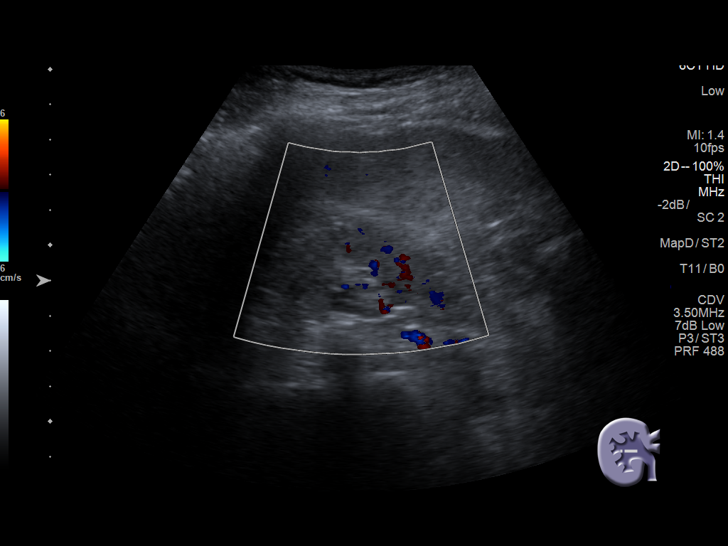
[im 22/57]
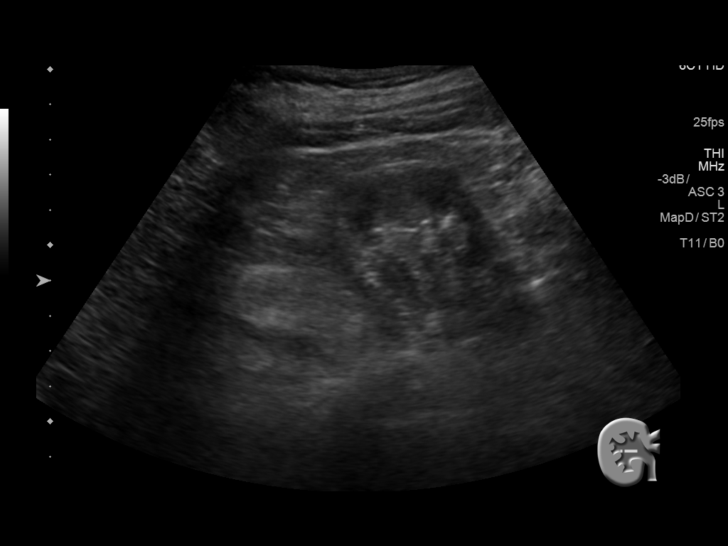
[im 26/57]
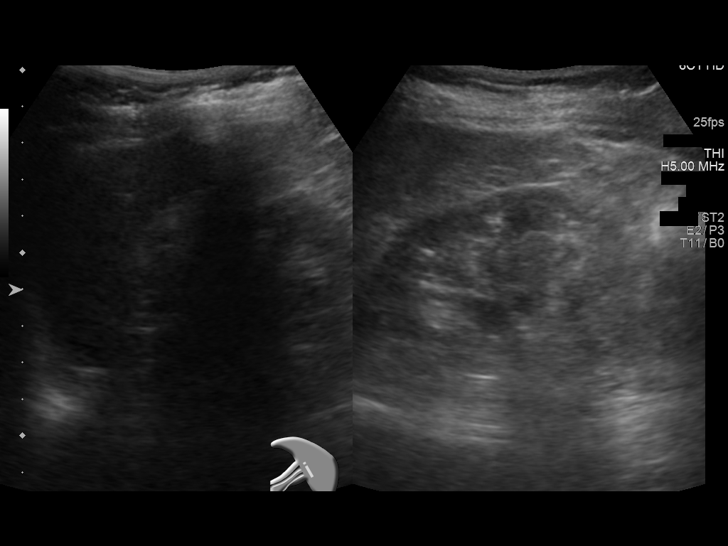
[im 31/57]
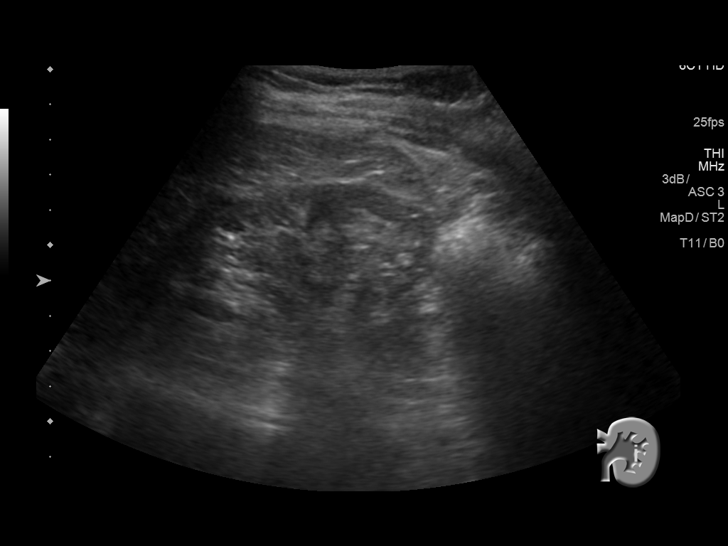
[im 36/57]
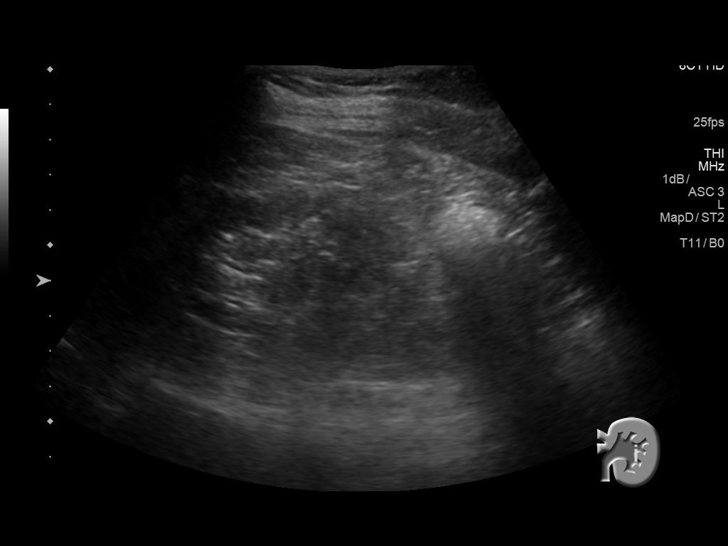
[im 38/57]
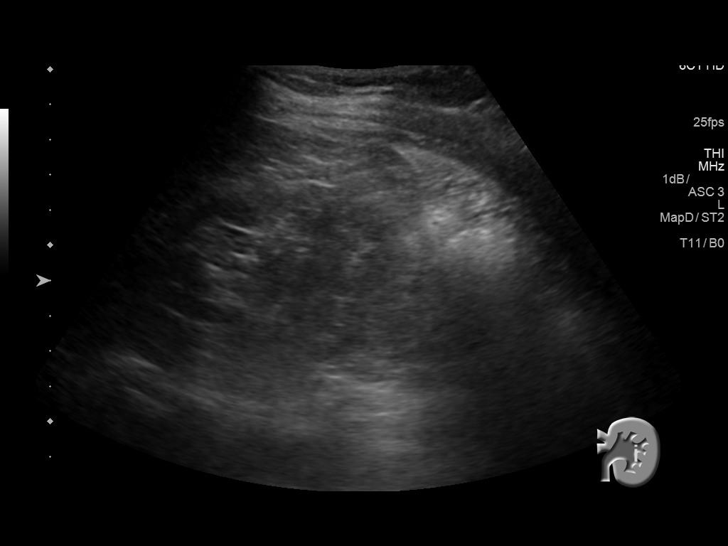
[im 43/57]
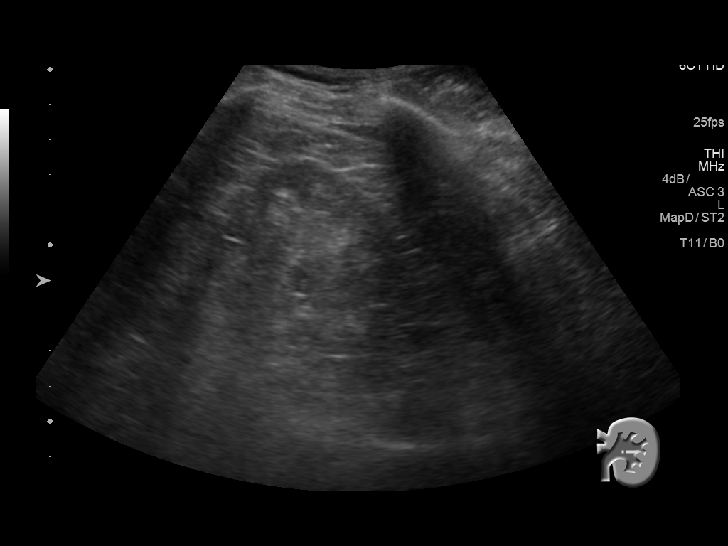
[im 47/57]
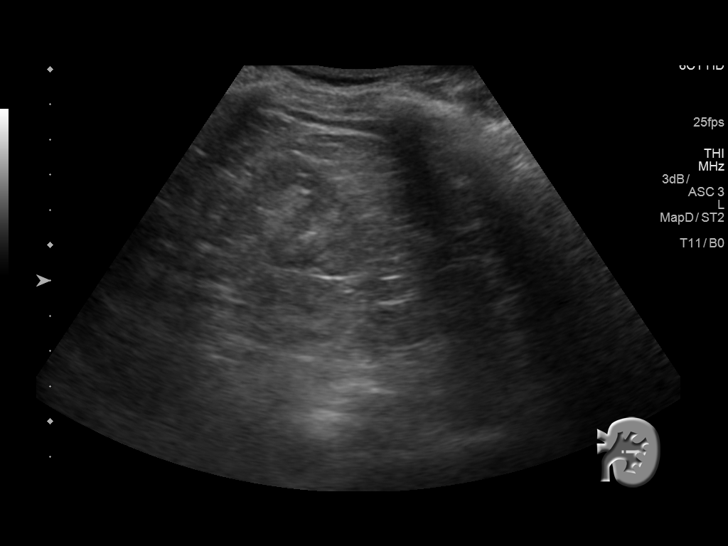
[im 52/57]
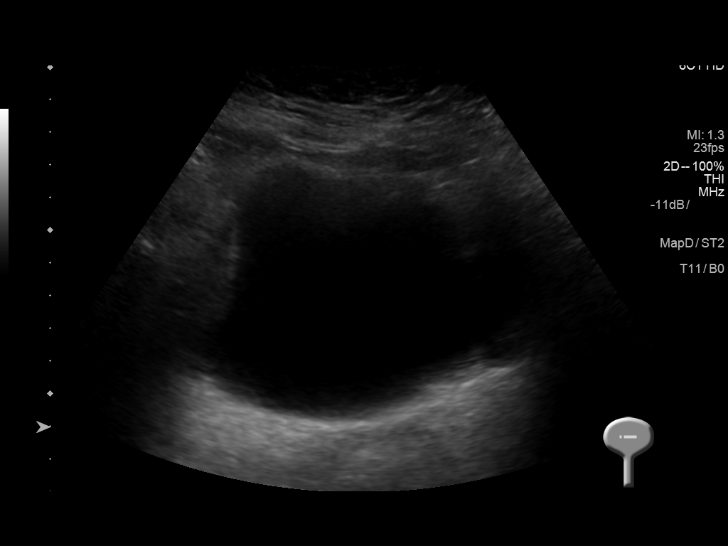
[im 57/57]
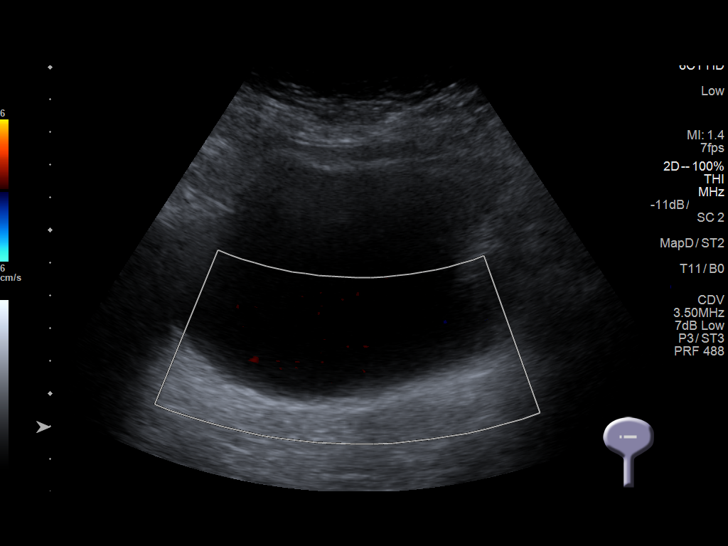

[14 of 25 positions shown; findings below may reference images not displayed]

FINDINGS: Right Kidney:

Length: 8.7 cm. No hydronephrosis. Nonobstructing lower pole 1.1 x
1.4 cm calculus. Renal parenchymal thinning.

Left Kidney:

Length: 7.9 cm.. No hydronephrosis, mass or stone noted. Renal
parenchymal thinning.

Bladder:

Appears normal for degree of bladder distention.
IMPRESSION: Right lower pole 1.4 x 1.1 cm nonobstructing calculus.

No hydronephrosis.

Bilateral renal parenchymal thinning.

## 2019-05-07 ENCOUNTER — Other Ambulatory Visit: Payer: Self-pay | Admitting: Family Medicine

## 2019-07-01 ENCOUNTER — Other Ambulatory Visit: Payer: Self-pay | Admitting: Family Medicine

## 2019-07-23 ENCOUNTER — Other Ambulatory Visit: Payer: Self-pay | Admitting: Family Medicine

## 2019-08-20 ENCOUNTER — Other Ambulatory Visit: Payer: Self-pay | Admitting: Family Medicine

## 2019-09-23 ENCOUNTER — Ambulatory Visit: Payer: Medicare Other | Admitting: Family Medicine

## 2019-10-16 ENCOUNTER — Other Ambulatory Visit: Payer: Self-pay | Admitting: Family Medicine

## 2019-11-20 ENCOUNTER — Encounter: Payer: Medicare Other | Admitting: Family Medicine

## 2019-12-11 ENCOUNTER — Other Ambulatory Visit: Payer: Self-pay | Admitting: Family Medicine

## 2019-12-18 DIAGNOSIS — M2041 Other hammer toe(s) (acquired), right foot: Secondary | ICD-10-CM | POA: Diagnosis not present

## 2019-12-18 DIAGNOSIS — B351 Tinea unguium: Secondary | ICD-10-CM | POA: Diagnosis not present

## 2019-12-18 DIAGNOSIS — N189 Chronic kidney disease, unspecified: Secondary | ICD-10-CM | POA: Diagnosis not present

## 2019-12-22 ENCOUNTER — Telehealth (INDEPENDENT_AMBULATORY_CARE_PROVIDER_SITE_OTHER): Payer: Medicare Other

## 2019-12-22 ENCOUNTER — Other Ambulatory Visit: Payer: Self-pay

## 2019-12-22 VITALS — BP 130/70 | Ht 62.0 in | Wt 98.0 lb

## 2019-12-22 DIAGNOSIS — Z0000 Encounter for general adult medical examination without abnormal findings: Secondary | ICD-10-CM | POA: Diagnosis not present

## 2019-12-22 MED ORDER — LOVASTATIN 40 MG PO TABS: ORAL_TABLET | ORAL | 4 refills | Status: DC

## 2019-12-22 MED ORDER — OMEPRAZOLE 20 MG PO CPDR: DELAYED_RELEASE_CAPSULE | ORAL | 4 refills | Status: DC

## 2019-12-22 MED ORDER — FAMOTIDINE 20 MG PO TABS: 20.0000 mg | ORAL_TABLET | Freq: Every day | ORAL | 4 refills | Status: DC

## 2019-12-22 MED ORDER — LISINOPRIL 5 MG PO TABS: ORAL_TABLET | ORAL | 4 refills | Status: DC

## 2019-12-22 NOTE — Progress Notes (Signed)
Subjective:   Kristy Jarvis is a 86 y.o. female who presents for Medicare Annual (Subsequent) preventive examination.  Review of Systems     Cardiac Risk Factors include: advanced age (>15men, >38 women);dyslipidemia;hypertension     Objective:    Today's Vitals   12/22/19 1419  BP: 130/70  Weight: 98 lb (44.5 kg)  Height: 5\' 2"  (1.575 m)   Body mass index is 17.92 kg/m.  Advanced Directives 12/22/2019 11/12/2017 12/17/2016 12/17/2016 07/20/2016 11/24/2015 09/03/2015  Does Patient Have a Medical Advance Directive? Yes Yes Yes Yes Yes Yes Yes  Type of Advance Directive - [place];Living will Living will;Healthcare Power of Attorney Living will;Healthcare Power of [place];Living will Living will Living will;Healthcare Power of Attorney  Does patient want to make changes to medical advance directive? No - Patient declined No - Patient declined No - Patient declined - No - Patient declined - No - Patient declined  Copy of [place] in Chart? - Yes No - copy requested - Yes No - copy requested No - copy requested  Would patient like information on creating a medical advance directive? - - - - - - -  Pre-existing out of facility DNR order (yellow form or pink MOST form) - - - - - - -    Current Medications (verified) Outpatient Encounter Medications as of 12/22/2019  Medication Sig  . calcium carbonate (TUMS EX) 750 MG chewable tablet Chew 2 tablets by mouth daily.   . Cholecalciferol (VITAMIN D PO) Take 1 tablet by mouth daily.  . famotidine (PEPCID) 20 MG tablet Take 1 tablet (20 mg total) by mouth daily.  Marland Kitchen lisinopril (ZESTRIL) 5 MG tablet TAKE (1) TABLET BY MOUTH ONCE DAILY.  Marland Kitchen lovastatin (MEVACOR) 40 MG tablet TAKE (1) TABLET BY MOUTH AT BEDTIME.  Marland Kitchen omeprazole (PRILOSEC) 20 MG capsule TAKE (1) CAPSULE BY MOUTH AT BEDTIME.  . [DISCONTINUED] famotidine (PEPCID) 20 MG tablet TAKE 1 TABLET BY MOUTH ONCE DAILY.  .  [DISCONTINUED] lisinopril (ZESTRIL) 5 MG tablet TAKE (1) TABLET BY MOUTH ONCE DAILY.  . [DISCONTINUED] lovastatin (MEVACOR) 40 MG tablet TAKE (1) TABLET BY MOUTH AT BEDTIME.  . [DISCONTINUED] omeprazole (PRILOSEC) 20 MG capsule TAKE (1) CAPSULE BY MOUTH AT BEDTIME.   No facility-administered encounter medications on file as of 12/22/2019.    Allergies (verified) Alendronate sodium and Penicillins   History: Past Medical History:  Diagnosis Date  . Cataract   . Diverticulosis of colon    Noted by CT  . GERD (gastroesophageal reflux disease)   . Hyperlipidemia   . Intermittent vertigo   . Osteoporosis   . Renal colic    Past Surgical History:  Procedure Laterality Date  . ABDOMINAL HYSTERECTOMY    . BUNIONECTOMY  06/27/2011   left , Dr Berline Lopes  . CATARACT EXTRACTION  2002   left eye   . CHOLECYSTECTOMY  1989  . ESOPHAGOGASTRODUODENOSCOPY  Jan  2007   prominent schatzki's ring, s/p 40F, small to moderate size hh  . EYE SURGERY  2005 approx   cataract left  . HIP SURGERY Left 12/20/2013  . INTRAMEDULLARY (IM) NAIL INTERTROCHANTERIC Left 01/02/2014   Procedure: INTRAMEDULLARY (IM) NAIL INTERTROCHANTRIC;  Surgeon: Newt Minion, MD;  Location: [facility];  Service: Orthopedics;  Laterality: Left;   Family History  Problem Relation Age of Onset  . Heart attack Father   . Diabetes Sister   . Diabetes Sister   . Diabetes Sister   .  Parkinson's disease Sister   . Liver cancer Other        family history   . Heart disease Brother   . Prostate cancer Brother   . Colon cancer Neg Hx   . Anesthesia problems Neg Hx   . Hypotension Neg Hx   . Malignant hyperthermia Neg Hx   . Pseudochol deficiency Neg Hx    Social History   Socioeconomic History  . Marital status: Widowed    Spouse name: Not on file  . Number of children: 1  . Years of education: Not on file  . Highest education level: Not on file  Occupational History  . Occupation: retired   Tobacco Use  . Smoking status:  Never Smoker  . Smokeless tobacco: Never Used  Vaping Use  . Vaping Use: Never used  Substance and Sexual Activity  . Alcohol use: No  . Drug use: No  . Sexual activity: Never  Other Topics Concern  . Not on file  Social History Narrative   GOES TO [place] DURING THE WEEK DAILY.    Social Determinants of Health   Financial Resource Strain: Low Risk   . Difficulty of Paying Living Expenses: Not hard at all  Food Insecurity: No Food Insecurity  . Worried About Charity fundraiser in the Last Year: Never true  . Ran Out of Food in the Last Year: Never true  Transportation Needs: No Transportation Needs  . Lack of Transportation (Medical): No  . Lack of Transportation (Non-Medical): No  Physical Activity: Sufficiently Active  . Days of Exercise per Week: 5 days  . Minutes of Exercise per Session: 60 min  Stress: No Stress Concern Present  . Feeling of Stress : Not at all  Social Connections: Moderately Integrated  . Frequency of Communication with Friends and Family: More than three times a week  . Frequency of Social Gatherings with Friends and Family: More than three times a week  . Attends Religious Services: More than 4 times per year  . Active Member of Clubs or Organizations: Yes  . Attends Archivist Meetings: More than 4 times per year  . Marital Status: Widowed    Tobacco Counseling Counseling given: Not Answered   Clinical Intake:  Pre-visit preparation completed: Yes  Pain : No/denies pain     Nutritional Status: BMI of 19-24  Normal  How often do you need to have someone help you when you read instructions, pamphlets, or other written materials from your doctor or pharmacy?: 2 - Rarely  Diabetic? no     Information entered by :: Gabriana Wilmott   Activities of Daily Living In your present state of health, do you have any difficulty performing the following activities: 12/22/2019 03/24/2019  Hearing? N Y  Vision? N -    Difficulty concentrating or making decisions? N -  Walking or climbing stairs? Y -  Dressing or bathing? N -  Doing errands, shopping? Y -  Conservation officer, nature and eating ? N -  Using the Toilet? N -  In the past six months, have you accidently leaked urine? N -  Do you have problems with loss of bowel control? N -  Managing your Medications? N -  Managing your Finances? N -  Housekeeping or managing your Housekeeping? Y -  Some recent data might be hidden    Patient Care Team: Fayrene Helper, MD as PCP - General Rourk, Cristopher Estimable, MD (Gastroenterology) Alyson Ingles Candee Furbish, MD  as Consulting Physician (Urology) Cristal Deer, DPM as Attending Physician (Podiatry) Carole Civil, MD as Consulting Physician (Orthopedic Surgery) Leta Baptist, MD as Consulting Physician (Otolaryngology)  Indicate any recent Medical Services you may have received from other than [facility] providers in the past year (date may be approximate).     Assessment:   This is a routine wellness examination for [facility].  Hearing/Vision screen No exam data present  Dietary issues and exercise activities discussed: Current Exercise Habits: Structured exercise class, Time (Minutes): 60, Frequency (Times/Week): 5, Weekly Exercise (Minutes/Week): 300  Goals    . DIET - REDUCE PORTION SIZE    . Increase water intake     Recommend increasing water intake to 5-6 glasses a day.    . Prevent falls      Depression Screen PHQ 2/9 Scores 12/22/2019 03/24/2019 11/19/2018 03/06/2018 11/12/2017 01/23/2017 12/26/2016  PHQ - 2 Score 0 0 0 0 0 0 0  PHQ- 9 Score - - - - - - -    Fall Risk Fall Risk  12/22/2019 03/24/2019 11/19/2018 03/06/2018 11/12/2017  Falls in the past year? 0 1 1 No Yes  Number falls in past yr: 0 1 1 - 1  Injury with Fall? 0 1 0 - No  Risk Factor Category  - - - - High Fall Risk  Risk for fall due to : - - - - Impaired balance/gait;History of fall(s)    Any stairs in or around the home? No  If so, are  there any without handrails? No  Home free of loose throw rugs in walkways, pet beds, electrical cords, etc? Yes  Adequate lighting in your home to reduce risk of falls? Yes   ASSISTIVE DEVICES UTILIZED TO PREVENT FALLS:  Life alert? Yes  Use of a cane, walker or w/c? Yes  Grab bars in the bathroom? No  Shower chair or bench in shower? Yes  Elevated toilet seat or a handicapped toilet? Yes   TIMED UP AND GO:  Was the test performed? No .  Length of time to ambulate 10 feet:  sec.     Cognitive Function: MMSE - Mini Mental State Exam 07/20/2016 07/13/2014  Orientation to time 3 5  Orientation to Place 5 5  Registration 3 3  Attention/ Calculation 5 5  Recall 1 2  Language- name 2 objects 2 2  Language- repeat 1 1  Language- follow 3 step command 3 3  Language- read & follow direction 1 1  Write a sentence 1 1  Copy design 1 1  Total score 26 29     6CIT Screen 12/22/2019 11/19/2018 11/12/2017  What Year? 4 points 0 points 0 points  What month? 0 points 0 points 0 points  What time? 0 points 0 points 0 points  Count back from 20 0 points 0 points 0 points  Months in reverse 0 points 4 points 4 points  Repeat phrase 2 points 0 points 4 points  Total Score 6 4 8     Immunizations Immunization History  Administered Date(s) Administered  . Fluad Quad(high Dose 65+) 03/24/2019  . Influenza Split 02/22/2011, 02/14/2012  . Influenza,inj,Quad PF,6+ Mos 03/05/2013, 03/09/2014, 03/09/2015, 01/23/2017, 03/06/2018  . Moderna SARS-COVID-2 Vaccination 07/14/2019, 08/11/2019  . PPD Test 01/01/2017  . Pneumococcal Conjugate-13 07/13/2014  . Pneumococcal Polysaccharide-23 09/12/2010  . Td 09/17/2008    TDAP status: Up to date Flu Vaccine status: Up to date Pneumococcal vaccine status: Up to date Covid-19 vaccine status: Completed vaccines  Qualifies for Shingles Vaccine? Yes   Zostavax completed No   Shingrix Completed?: No.    Education has been provided regarding the  importance of this vaccine. Patient has been advised to call insurance company to determine out of pocket expense if they have not yet received this vaccine. Advised may also receive vaccine at local pharmacy or Health Dept. Verbalized acceptance and understanding.  Screening Tests Health Maintenance  Topic Date Due  . INFLUENZA VACCINE  12/21/2019  . TETANUS/TDAP  04/14/2020 (Originally 09/18/2018)  . DEXA SCAN  Completed  . COVID-19 Vaccine  Completed  . PNA vac Low Risk Adult  Completed    Health Maintenance  Health Maintenance Due  Topic Date Due  . INFLUENZA VACCINE  12/21/2019    Colorectal cancer screening: No longer required.  Mammogram status: No longer required.  Bone Density status: Completed no longer required. Results reflect: Bone density results: OSTEOPOROSIS. Repeat every no longer required years.  Lung Cancer Screening: (Low Dose CT Chest recommended if Age 31-80 years, 30 pack-year currently smoking OR have quit w/in 15years.) does not qualify.   Lung Cancer Screening Referral: no  Additional Screening:  Hepatitis C Screening: does qualify; Completed   Vision Screening: Recommended annual ophthalmology exams for early detection of glaucoma and other disorders of the eye. Is the patient up to date with their annual eye exam?  Yes  Who is the provider or what is the name of the office in which the patient attends annual eye exams? Patient states not needed If pt is not established with a provider, would they like to be referred to a provider to establish care? No .   Dental Screening: Recommended annual dental exams for proper oral hygiene  Community Resource Referral / Chronic Care Management: CRR required this visit?  No   CCM required this visit?  No      Plan:     I have personally reviewed and noted the following in the patient's chart:   . Medical and social history . Use of alcohol, tobacco or illicit drugs  . Current medications and  supplements . Functional ability and status . Nutritional status . Physical activity . Advanced directives . List of other physicians . Hospitalizations, surgeries, and ER visits in previous 12 months . Vitals . Screenings to include cognitive, depression, and falls . Referrals and appointments  In addition, I have reviewed and discussed with patient certain preventive protocols, quality metrics, and best practice recommendations. A written personalized care plan for preventive services as well as general preventive health recommendations were provided to patient.     Kate Sable, LPN, LPN   10/23/6801   Nurse Notes: visit done by telephone. Pt at home with daughter and provider in office. Time spent with pt- 25 mins

## 2019-12-22 NOTE — Patient Instructions (Addendum)
Kristy Jarvis , Thank you for taking time to come for your Medicare Wellness Visit. I appreciate your ongoing commitment to your health goals. Please review the following plan we discussed and let me know if I can assist you in the future.   Screening recommendations/referrals: Colonoscopy: no longer needed Mammogram: no longer needed Bone Density: no longer needed Recommended yearly ophthalmology/optometry visit for glaucoma screening and checkup Recommended yearly dental visit for hygiene and checkup  Vaccinations: Influenza vaccine: can come get vaccine 1st week in Sept Pneumococcal vaccine: up to date Tdap vaccine: up to date Shingles vaccine: due- can get shingrix at local pharmacy  Advanced directives: on file    Next appointment:  01/19/2020 1:20 pm with Dr Moshe Cipro   Preventive Care 65 Years and Older, Female Preventive care refers to lifestyle choices and visits with your health care provider that can promote health and wellness. What does preventive care include?  A yearly physical exam. This is also called an annual well check.  Dental exams once or twice a year.  Routine eye exams. Ask your health care provider how often you should have your eyes checked.  Personal lifestyle choices, including:  Daily care of your teeth and gums.  Regular physical activity.  Eating a healthy diet.  Avoiding tobacco and drug use.  Limiting alcohol use.  Practicing safe sex.  Taking low-dose aspirin every day.  Taking vitamin and mineral supplements as recommended by your health care provider. What happens during an annual well check? The services and screenings done by your health care provider during your annual well check will depend on your age, overall health, lifestyle risk factors, and family history of disease. Counseling  Your health care provider may ask you questions about your:  Alcohol use.  Tobacco use.  Drug use.  Emotional well-being.  Home and  relationship well-being.  Sexual activity.  Eating habits.  History of falls.  Memory and ability to understand (cognition).  Work and work Statistician.  Reproductive health. Screening  You may have the following tests or measurements:  Height, weight, and BMI.  Blood pressure.  Lipid and cholesterol levels. These may be checked every 5 years, or more frequently if you are over 64 years old.  Skin check.  Lung cancer screening. You may have this screening every year starting at age 59 if you have a 30-pack-year history of smoking and currently smoke or have quit within the past 15 years.  Fecal occult blood test (FOBT) of the stool. You may have this test every year starting at age 74.  Flexible sigmoidoscopy or colonoscopy. You may have a sigmoidoscopy every 5 years or a colonoscopy every 10 years starting at age 48.  Hepatitis C blood test.  Hepatitis B blood test.  Sexually transmitted disease (STD) testing.  Diabetes screening. This is done by checking your blood sugar (glucose) after you have not eaten for a while (fasting). You may have this done every 1-3 years.  Bone density scan. This is done to screen for osteoporosis. You may have this done starting at age 94.  Mammogram. This may be done every 1-2 years. Talk to your health care provider about how often you should have regular mammograms. Talk with your health care provider about your test results, treatment options, and if necessary, the need for more tests. Vaccines  Your health care provider may recommend certain vaccines, such as:  Influenza vaccine. This is recommended every year.  Tetanus, diphtheria, and acellular pertussis (Tdap, Td) vaccine.  You may need a Td booster every 10 years.  Zoster vaccine. You may need this after age 58.  Pneumococcal 13-valent conjugate (PCV13) vaccine. One dose is recommended after age 57.  Pneumococcal polysaccharide (PPSV23) vaccine. One dose is recommended after  age 76. Talk to your health care provider about which screenings and vaccines you need and how often you need them. This information is not intended to replace advice given to you by your health care provider. Make sure you discuss any questions you have with your health care provider. Document Released: 06/04/2015 Document Revised: 01/26/2016 Document Reviewed: 03/09/2015 Elsevier Interactive Patient Education  2017 [place] Prevention in the Home Falls can cause injuries. They can happen to people of all ages. There are many things you can do to make your home safe and to help prevent falls. What can I do on the outside of my home?  Regularly fix the edges of walkways and driveways and fix any cracks.  Remove anything that might make you trip as you walk through a door, such as a raised step or threshold.  Trim any bushes or trees on the path to your home.  Use bright outdoor lighting.  Clear any walking paths of anything that might make someone trip, such as rocks or tools.  Regularly check to see if handrails are loose or broken. Make sure that both sides of any steps have handrails.  Any raised decks and porches should have guardrails on the edges.  Have any leaves, snow, or ice cleared regularly.  Use sand or salt on walking paths during winter.  Clean up any spills in your garage right away. This includes oil or grease spills. What can I do in the bathroom?  Use night lights.  Install grab bars by the toilet and in the tub and shower. Do not use towel bars as grab bars.  Use non-skid mats or decals in the tub or shower.  If you need to sit down in the shower, use a plastic, non-slip stool.  Keep the floor dry. Clean up any water that spills on the floor as soon as it happens.  Remove soap buildup in the tub or shower regularly.  Attach bath mats securely with double-sided non-slip rug tape.  Do not have throw rugs and other things on the floor that can  make you trip. What can I do in the bedroom?  Use night lights.  Make sure that you have a light by your bed that is easy to reach.  Do not use any sheets or blankets that are too big for your bed. They should not hang down onto the floor.  Have a firm chair that has side arms. You can use this for support while you get dressed.  Do not have throw rugs and other things on the floor that can make you trip. What can I do in the kitchen?  Clean up any spills right away.  Avoid walking on wet floors.  Keep items that you use a lot in easy-to-reach places.  If you need to reach something above you, use a strong step stool that has a grab bar.  Keep electrical cords out of the way.  Do not use floor polish or wax that makes floors slippery. If you must use wax, use non-skid floor wax.  Do not have throw rugs and other things on the floor that can make you trip. What can I do with my stairs?  Do not leave any  items on the stairs.  Make sure that there are handrails on both sides of the stairs and use them. Fix handrails that are broken or loose. Make sure that handrails are as long as the stairways.  Check any carpeting to make sure that it is firmly attached to the stairs. Fix any carpet that is loose or worn.  Avoid having throw rugs at the top or bottom of the stairs. If you do have throw rugs, attach them to the floor with carpet tape.  Make sure that you have a light switch at the top of the stairs and the bottom of the stairs. If you do not have them, ask someone to add them for you. What else can I do to help prevent falls?  Wear shoes that:  Do not have high heels.  Have rubber bottoms.  Are comfortable and fit you well.  Are closed at the toe. Do not wear sandals.  If you use a stepladder:  Make sure that it is fully opened. Do not climb a closed stepladder.  Make sure that both sides of the stepladder are locked into place.  Ask someone to hold it for you,  if possible.  Clearly mark and make sure that you can see:  Any grab bars or handrails.  First and last steps.  Where the edge of each step is.  Use tools that help you move around (mobility aids) if they are needed. These include:  Canes.  Walkers.  Scooters.  Crutches.  Turn on the lights when you go into a dark area. Replace any light bulbs as soon as they burn out.  Set up your furniture so you have a clear path. Avoid moving your furniture around.  If any of your floors are uneven, fix them.  If there are any pets around you, be aware of where they are.  Review your medicines with your doctor. Some medicines can make you feel dizzy. This can increase your chance of falling. Ask your doctor what other things that you can do to help prevent falls. This information is not intended to replace advice given to you by your health care provider. Make sure you discuss any questions you have with your health care provider. Document Released: 03/04/2009 Document Revised: 10/14/2015 Document Reviewed: 06/12/2014 Elsevier Interactive Patient Education  2017 [employer].

## 2020-01-01 ENCOUNTER — Other Ambulatory Visit: Payer: Self-pay

## 2020-01-01 ENCOUNTER — Encounter: Payer: Self-pay | Admitting: Family Medicine

## 2020-01-01 ENCOUNTER — Ambulatory Visit (INDEPENDENT_AMBULATORY_CARE_PROVIDER_SITE_OTHER): Payer: Medicare Other | Admitting: Family Medicine

## 2020-01-01 VITALS — BP 140/72 | HR 60 | Resp 16 | Ht 62.0 in | Wt 94.0 lb

## 2020-01-01 DIAGNOSIS — I10 Essential (primary) hypertension: Secondary | ICD-10-CM

## 2020-01-01 DIAGNOSIS — K219 Gastro-esophageal reflux disease without esophagitis: Secondary | ICD-10-CM | POA: Diagnosis not present

## 2020-01-01 DIAGNOSIS — Z0289 Encounter for other administrative examinations: Secondary | ICD-10-CM | POA: Diagnosis not present

## 2020-01-01 DIAGNOSIS — M810 Age-related osteoporosis without current pathological fracture: Secondary | ICD-10-CM

## 2020-01-01 DIAGNOSIS — E785 Hyperlipidemia, unspecified: Secondary | ICD-10-CM

## 2020-01-01 DIAGNOSIS — Z9181 History of falling: Secondary | ICD-10-CM

## 2020-01-01 DIAGNOSIS — R636 Underweight: Secondary | ICD-10-CM

## 2020-01-01 DIAGNOSIS — E559 Vitamin D deficiency, unspecified: Secondary | ICD-10-CM | POA: Diagnosis not present

## 2020-01-01 DIAGNOSIS — R7989 Other specified abnormal findings of blood chemistry: Secondary | ICD-10-CM

## 2020-01-01 NOTE — Patient Instructions (Signed)
Annual physical exam with MD in 6 months, call if you need me sooner  Labs today, CBC, lipid, cmp and EGFR, TSH and vit D  Please make an effort to increase how often and how much you  Eat so that youi will regain the 4 pounds t you have lost by your 67 Th birthday in December  Please be careful not to fall, use your walker all the time and move slowly  Thanks for choosing [facility], we consider it a privelige to serve you.

## 2020-01-01 NOTE — Progress Notes (Signed)
   Kristy Jarvis     MRN: [id number]      DOB: 25-Jan-1921   HPI Kristy Jarvis is here for follow up and re-evaluation of chronic medical conditions, medication management and review of any available recent lab and radiology data.  Daughter feels that Mom  Often misses her meds and that she is not eating enough No falls. [place]/ [place] to center and thoroughly enjoys this Reports regular BM and denies frequency, incontinence or malodorous urine Her for form completion for [facility]  ROS Denies recent fever or chills. Denies sinus pressure, nasal congestion, ear pain or sore throat. Denies chest congestion, productive cough or wheezing. Denies chest pains, palpitations and leg swelling Denies abdominal pain, nausea, vomiting,diarrhea or constipation.   Denies dysuria, frequency, hesitancy or incontinence. Chronic join stiffness  limitation in mobility. Denies headaches, seizures, numbness, or tingling. Denies depression, anxiety or insomnia. Denies skin break down or rash.   PE  BP 140/72   Pulse 60   Resp 16   Ht 5\' 2"  (1.575 m)   Wt 94 lb (42.6 kg)   BMI 17.19 kg/m  0  Patient alert and oriented and in no cardiopulmonary distress.  HEENT: No facial asymmetry, EOMI,     Neck supple .  Chest: Clear to auscultation bilaterally.  CVS: S1, S2 no murmurs, no S3.Regular rate.  ABD: Soft non tender.   Ext: No edema  MS: Adequate ROM spine, shoulders, hips and knees.  Skin: Intact, no ulcerations or rash noted.  Psych: Good eye contact, normal affect. Memory intact not anxious or depressed appearing.  CNS: CN 2-12 intact, power,  normal throughout.no focal deficits noted.   Assessment & Plan  Encounter for completion of form with patient Exam as documented. Form completed for [facility]  Hyperlipemia Hyperlipidemia:Low fat diet discussed and encouraged.   Lipid Panel  Lab Results  Component Value Date   CHOL 182 01/01/2020   HDL 75 01/01/2020    LDLCALC 87 01/01/2020   LDLDIRECT 85 06/17/2012   TRIG 115 01/01/2020   CHOLHDL 2.4 01/01/2020   Controlled, no change in medication     At high risk for falls Home safety and regular use of walker discussed  HTN (hypertension) Adequate control, no med change  Osteoporosis Continue calcium with D daily   GERD Controlled, no change in medication

## 2020-01-01 NOTE — Assessment & Plan Note (Signed)
Exam as documented. Form completed for [facility]

## 2020-01-02 LAB — CMP14+EGFR
ALT: 10 IU/L (ref 0–32)
AST: 22 IU/L (ref 0–40)
Albumin/Globulin Ratio: 1.6 (ref 1.2–2.2)
Albumin: 4.4 g/dL (ref 3.5–4.6)
Alkaline Phosphatase: 83 IU/L (ref 48–121)
BUN/Creatinine Ratio: 21 (ref 12–28)
BUN: 29 mg/dL (ref 10–36)
Bilirubin Total: 0.5 mg/dL (ref 0.0–1.2)
CO2: 26 mmol/L (ref 20–29)
Calcium: 10.6 mg/dL — ABNORMAL HIGH (ref 8.7–10.3)
Chloride: 106 mmol/L (ref 96–106)
Creatinine, Ser: 1.37 mg/dL — ABNORMAL HIGH (ref 0.57–1.00)
GFR calc Af Amer: 38 mL/min/{1.73_m2} — ABNORMAL LOW (ref >59)
GFR calc non Af Amer: 33 mL/min/{1.73_m2} — ABNORMAL LOW (ref >59)
Globulin, Total: 2.7 g/dL (ref 1.5–4.5)
Glucose: 102 mg/dL — ABNORMAL HIGH (ref 65–99)
Potassium: 4.5 mmol/L (ref 3.5–5.2)
Sodium: 145 mmol/L — ABNORMAL HIGH (ref 134–144)
Total Protein: 7.1 g/dL (ref 6.0–8.5)

## 2020-01-02 LAB — CBC
Hematocrit: 44.9 % (ref 34.0–46.6)
Hemoglobin: 14.8 g/dL (ref 11.1–15.9)
MCH: 30 pg (ref 26.6–33.0)
MCHC: 33 g/dL (ref 31.5–35.7)
MCV: 91 fL (ref 79–97)
Platelets: 241 10*3/uL (ref 150–450)
RBC: 4.93 x10E6/uL (ref 3.77–5.28)
RDW: 12.5 % (ref 11.7–15.4)
WBC: 7.3 10*3/uL (ref 3.4–10.8)

## 2020-01-02 LAB — TSH: TSH: 0.366 u[IU]/mL — ABNORMAL LOW (ref 0.450–4.500)

## 2020-01-02 LAB — LIPID PANEL
Chol/HDL Ratio: 2.4 ratio (ref 0.0–4.4)
Cholesterol, Total: 182 mg/dL (ref 100–199)
HDL: 75 mg/dL (ref >39)
LDL Chol Calc (NIH): 87 mg/dL (ref 0–99)
Triglycerides: 115 mg/dL (ref 0–149)
VLDL Cholesterol Cal: 20 mg/dL (ref 5–40)

## 2020-01-02 LAB — VITAMIN D 25 HYDROXY (VIT D DEFICIENCY, FRACTURES): Vit D, 25-Hydroxy: 38.3 ng/mL (ref 30.0–100.0)

## 2020-01-04 ENCOUNTER — Encounter: Payer: Self-pay | Admitting: Family Medicine

## 2020-01-04 DIAGNOSIS — R636 Underweight: Secondary | ICD-10-CM | POA: Insufficient documentation

## 2020-01-04 NOTE — Assessment & Plan Note (Signed)
Home safety and regular use of walker discussed

## 2020-01-04 NOTE — Assessment & Plan Note (Signed)
Adequate control, no med change 

## 2020-01-04 NOTE — Assessment & Plan Note (Addendum)
Continue calcium with D daily

## 2020-01-04 NOTE — Assessment & Plan Note (Signed)
Encouraged to eat more frequently and higher calorie foods, currenly underweight, goal weight gain of 4 pounds in 4 monhts

## 2020-01-04 NOTE — Assessment & Plan Note (Signed)
Hyperlipidemia:Low fat diet discussed and encouraged.   Lipid Panel  Lab Results  Component Value Date   CHOL 182 01/01/2020   HDL 75 01/01/2020   LDLCALC 87 01/01/2020   LDLDIRECT 85 06/17/2012   TRIG 115 01/01/2020   CHOLHDL 2.4 01/01/2020   Controlled, no change in medication

## 2020-01-04 NOTE — Assessment & Plan Note (Signed)
Controlled, no change in medication  

## 2020-01-15 ENCOUNTER — Ambulatory Visit
Admission: EM | Admit: 2020-01-15 | Discharge: 2020-01-15 | Disposition: A | Discharge status: Home / Self Care | Payer: Medicare Other | Attending: Emergency Medicine | Admitting: Emergency Medicine

## 2020-01-15 ENCOUNTER — Other Ambulatory Visit: Payer: Self-pay

## 2020-01-15 DIAGNOSIS — J069 Acute upper respiratory infection, unspecified: Secondary | ICD-10-CM | POA: Diagnosis not present

## 2020-01-15 DIAGNOSIS — Z20822 Contact with and (suspected) exposure to covid-19: Secondary | ICD-10-CM

## 2020-01-15 DIAGNOSIS — Z1152 Encounter for screening for COVID-19: Secondary | ICD-10-CM | POA: Diagnosis not present

## 2020-01-15 MED ORDER — BENZONATATE 100 MG PO CAPS: 100.0000 mg | ORAL_CAPSULE | Freq: Three times a day (TID) | ORAL | 0 refills | Status: DC

## 2020-01-15 NOTE — ED Triage Notes (Signed)
Pt presents with c/o cough that began on Sunday, pts CG states that she normally has cough but this is worse and has had a runny nose

## 2020-01-15 NOTE — ED Provider Notes (Signed)
[facility]   [id number] 01/15/20 Arrival Time: 0930   CC: COVID symptoms  SUBJECTIVE: History from: CG  Kristy Jarvis is a 86 y.o. female who presents with fatigue, cough and runny nose x 4 days.  Denies sick exposure to COVID, flu or strep.  Has tried theraflu with minimal relief.  Denies aggravating factors.  Denies previous COVID infection in the past.   Received COVID vaccines.  Denies fever, chills, sinus pain, sore throat, SOB, wheezing, chest pain, nausea, changes in bowel or bladder habits.    ROS: As per HPI.  All other pertinent ROS negative.     Past Medical History:  Diagnosis Date  . Cataract   . Diverticulosis of colon    Noted by CT  . GERD (gastroesophageal reflux disease)   . Hyperlipidemia   . Intermittent vertigo   . Osteoporosis   . Renal colic    Past Surgical History:  Procedure Laterality Date  . ABDOMINAL HYSTERECTOMY    . BUNIONECTOMY  06/27/2011   left , Dr Berline Lopes  . CATARACT EXTRACTION  2002   left eye   . CHOLECYSTECTOMY  1989  . ESOPHAGOGASTRODUODENOSCOPY  Jan  2007   prominent schatzki's ring, s/p 34F, small to moderate size hh  . EYE SURGERY  2005 approx   cataract left  . HIP SURGERY Left 12/20/2013  . INTRAMEDULLARY (IM) NAIL INTERTROCHANTERIC Left 01/02/2014   Procedure: INTRAMEDULLARY (IM) NAIL INTERTROCHANTRIC;  Surgeon: Newt Minion, MD;  Location: [facility];  Service: Orthopedics;  Laterality: Left;   Allergies  Allergen Reactions  . Alendronate Sodium Other (See Comments)    Chest Pain, Fatigue   . Penicillins Rash    Has patient had a PCN reaction causing immediate rash, facial/tongue/throat swelling, SOB or lightheadedness with hypotension: unknown Has patient had a PCN reaction causing severe rash involving mucus membranes or skin necrosis: unknown Has patient had a PCN reaction that required hospitalization no Has patient had a PCN reaction occurring within the last 10 years: no If all of the above answers are  "NO", then may proceed with Ceph   No current facility-administered medications on file prior to encounter.   Current Outpatient Medications on File Prior to Encounter  Medication Sig Dispense Refill  . calcium carbonate (TUMS EX) 750 MG chewable tablet Chew 2 tablets by mouth daily.     . Cholecalciferol (VITAMIN D PO) Take 1 tablet by mouth daily.    . famotidine (PEPCID) 20 MG tablet Take 1 tablet (20 mg total) by mouth daily. 90 tablet 4  . lisinopril (ZESTRIL) 5 MG tablet TAKE (1) TABLET BY MOUTH ONCE DAILY. 90 tablet 4  . lovastatin (MEVACOR) 40 MG tablet TAKE (1) TABLET BY MOUTH AT BEDTIME. 90 tablet 4  . omeprazole (PRILOSEC) 20 MG capsule TAKE (1) CAPSULE BY MOUTH AT BEDTIME. 90 capsule 4   Social History   Socioeconomic History  . Marital status: Widowed    Spouse name: Not on file  . Number of children: 1  . Years of education: Not on file  . Highest education level: Not on file  Occupational History  . Occupation: retired   Tobacco Use  . Smoking status: Never Smoker  . Smokeless tobacco: Never Used  Vaping Use  . Vaping Use: Never used  Substance and Sexual Activity  . Alcohol use: No  . Drug use: No  . Sexual activity: Never  Other Topics Concern  . Not on file  Social History Narrative  [facility] [employer] [facility]URING THE WEEK DAILY.    Social Determinants of Health   Financial Resource Strain: Low Risk   . Difficulty of Paying Living Expenses: Not hard at all  Food Insecurity: No Food Insecurity  . Worried About Charity fundraiser in the Last Year: Never true  . Ran Out of Food in the Last Year: Never true  Transportation Needs: No Transportation Needs  . Lack of Transportation (Medical): No  . Lack of Transportation (Non-Medical): No  Physical Activity: Sufficiently Active  . Days of Exercise per Week: 5 days  . Minutes of Exercise per Session: 60 min  Stress: No Stress Concern Present  . Feeling of Stress : Not at all  Social  Connections: Moderately Integrated  . Frequency of Communication with Friends and Family: More than three times a week  . Frequency of Social Gatherings with Friends and Family: More than three times a week  . Attends Religious Services: More than 4 times per year  . Active Member of Clubs or Organizations: Yes  . Attends Archivist Meetings: More than 4 times per year  . Marital Status: Widowed  Intimate Partner Violence:   . Fear of Current or Ex-Partner: Not on file  . Emotionally Abused: Not on file  . Physically Abused: Not on file  . Sexually Abused: Not on file   Family History  Problem Relation Age of Onset  . Heart attack Father   . Diabetes Sister   . Diabetes Sister   . Diabetes Sister   . Parkinson's disease Sister   . Liver cancer Other        family history   . Heart disease Brother   . Prostate cancer Brother   . Colon cancer Neg Hx   . Anesthesia problems Neg Hx   . Hypotension Neg Hx   . Malignant hyperthermia Neg Hx   . Pseudochol deficiency Neg Hx     OBJECTIVE:  Vitals:   01/15/20 0951  BP: 112/71  Pulse: 65  Resp: 20  Temp: 98.1 F (36.7 C)  SpO2: 97%    General appearance: alert; well-appearing, nontoxic; speaking in full sentences and tolerating own secretions HEENT: NCAT; Ears: EACs clear, TMs pearly gray; Eyes: PERRL.  EOM grossly intact. Nose: nares patent without rhinorrhea, Throat: oropharynx clear, tonsils non erythematous or enlarged, uvula midline  Neck: supple without LAD Lungs: unlabored respirations, symmetrical air entry; cough: mild; no respiratory distress; CTAB Heart: regular rate and rhythm.  Skin: warm and dry Psychological: alert and cooperative; normal mood and affect   ASSESSMENT & PLAN:  1. Encounter for screening for COVID-19   2. Viral URI with cough   3. Suspected COVID-19 virus infection     Meds ordered this encounter  Medications  . benzonatate (TESSALON) 100 MG capsule    Sig: Take 1 capsule (100  mg total) by mouth every 8 (eight) hours.    Dispense:  21 capsule    Refill:  0    Order Specific Question:   Supervising Provider    Answer:   Raylene Everts [[id number]]   COVID testing ordered.  It will take between 2-5 days for test results.  Someone will contact you regarding abnormal results.    In the meantime: You should remain isolated in your home for 10 days from symptom onset AND greater than 72 hours after symptoms resolution (absence of fever without the use of fever-reducing medication and improvement in respiratory symptoms),  whichever is longer Get plenty of rest and push fluids Tessalon Perles prescribed for cough Use OTC zyrtec for nasal congestion, runny nose, and/or sore throat Use OTC flonase for nasal congestion and runny nose Use medications daily for symptom relief Use OTC medications like ibuprofen or tylenol as needed fever or pain Call or go to the ED if you have any new or worsening symptoms such as fever, worsening cough, shortness of breath, chest tightness, chest pain, turning blue, changes in mental status, etc...   Reviewed expectations re: course of current medical issues. Questions answered. Outlined signs and symptoms indicating need for more acute intervention. Patient verbalized understanding. After Visit Summary given.         Lestine Box, PA-C 01/15/20 1013

## 2020-01-15 NOTE — Discharge Instructions (Signed)
COVID testing ordered.  It will take between 2-5 days for test results.  Someone will contact you regarding abnormal results.    In the meantime: You should remain isolated in your home for 10 days from symptom onset AND greater than 72 hours after symptoms resolution (absence of fever without the use of fever-reducing medication and improvement in respiratory symptoms), whichever is longer Get plenty of rest and push fluids Tessalon Perles prescribed for cough Use OTC zyrtec for nasal congestion, runny nose, and/or sore throat Use OTC flonase for nasal congestion and runny nose Use medications daily for symptom relief Use OTC medications like ibuprofen or tylenol as needed fever or pain Call or go to the ED if you have any new or worsening symptoms such as fever, worsening cough, shortness of breath, chest tightness, chest pain, turning blue, changes in mental status, etc..Marland Kitchen

## 2020-01-16 LAB — NOVEL CORONAVIRUS, NAA: SARS-CoV-2, NAA: NOT DETECTED

## 2020-01-16 LAB — SARS-COV-2, NAA 2 DAY TAT

## 2020-01-19 ENCOUNTER — Ambulatory Visit: Payer: Medicare Other | Admitting: Family Medicine

## 2020-01-22 ENCOUNTER — Encounter: Payer: Self-pay | Admitting: Family Medicine

## 2020-01-22 ENCOUNTER — Ambulatory Visit (HOSPITAL_COMMUNITY)
Admission: RE | Admit: 2020-01-22 | Discharge: 2020-01-22 | Disposition: A | Discharge status: Home / Self Care | Payer: Medicare Other | Source: Ambulatory Visit | Attending: Family Medicine | Admitting: Family Medicine

## 2020-01-22 ENCOUNTER — Ambulatory Visit (INDEPENDENT_AMBULATORY_CARE_PROVIDER_SITE_OTHER): Payer: Medicare Other | Admitting: Family Medicine

## 2020-01-22 ENCOUNTER — Other Ambulatory Visit: Payer: Self-pay

## 2020-01-22 VITALS — BP 121/63 | Temp 98.2°F | Resp 15 | Ht 62.0 in | Wt 90.0 lb

## 2020-01-22 DIAGNOSIS — N183 Chronic kidney disease, stage 3 unspecified: Secondary | ICD-10-CM | POA: Diagnosis not present

## 2020-01-22 DIAGNOSIS — I10 Essential (primary) hypertension: Secondary | ICD-10-CM | POA: Diagnosis not present

## 2020-01-22 DIAGNOSIS — R636 Underweight: Secondary | ICD-10-CM

## 2020-01-22 DIAGNOSIS — K449 Diaphragmatic hernia without obstruction or gangrene: Secondary | ICD-10-CM | POA: Diagnosis not present

## 2020-01-22 DIAGNOSIS — J40 Bronchitis, not specified as acute or chronic: Secondary | ICD-10-CM | POA: Diagnosis not present

## 2020-01-22 DIAGNOSIS — R05 Cough: Secondary | ICD-10-CM | POA: Diagnosis not present

## 2020-01-22 DIAGNOSIS — J309 Allergic rhinitis, unspecified: Secondary | ICD-10-CM | POA: Diagnosis not present

## 2020-01-22 DIAGNOSIS — J439 Emphysema, unspecified: Secondary | ICD-10-CM | POA: Diagnosis not present

## 2020-01-22 MED ORDER — BENZONATATE 100 MG PO CAPS: ORAL_CAPSULE | ORAL | 0 refills | Status: DC

## 2020-01-22 MED ORDER — CHLORPHENIRAMINE MALEATE 4 MG PO TABS: ORAL_TABLET | ORAL | 0 refills | Status: DC

## 2020-01-22 MED ORDER — AZITHROMYCIN 250 MG PO TABS: ORAL_TABLET | ORAL | 0 refills | Status: DC

## 2020-01-22 NOTE — Patient Instructions (Signed)
Follow-up as before call if you need me sooner.  You are treated today for bronchitis and  sinus drainage.  Azithromycin Tessalon Perles and chlorpheniramine tablets are prescribed.  Please get chest x-ray today.  Please increase water intake to a target of 60 to 64 ounces every day.  Please get nonfasting Chem-7 and EGFR the second week in October.This is to re assess your kidney function  Thanks for choosing [facility], we consider it a privelige to serve you.  Hope you feel better soon and go back to the center!

## 2020-01-22 NOTE — Assessment & Plan Note (Addendum)
z pack and tessalon perles and CXR

## 2020-01-22 NOTE — Progress Notes (Signed)
    Kristy Jarvis     MRN: [id number]      DOB: 30-Mar-1921   HPI Kristy Jarvis is here 2 week /o scratchy throat, cough, thick sputum, has had no fever or chills. Appetite and energy level are unchanged Has been sent home from adult day care because of new symptoms, had recent covid negative test, and no known contact. OTC meds not relieving symptoms and wants to return to daycare ROS . Denies chest pains, palpitations and leg swelling Denies abdominal pain, nausea, vomiting,diarrhea or constipation.   Denies dysuria, frequency, hesitancy or incontinence. Denies uncontrolled  joint pain, swelling and limitation in mobility. Denies headaches, seizures, numbness, or tingling. Denies depression, anxiety or insomnia. Denies skin break down or rash.   PE  BP 121/63   Temp 98.2 F (36.8 C) (Oral)   Resp 15   Ht 5\' 2"  (1.575 m)   Wt 90 lb (40.8 kg)   BMI 16.46 kg/m   Patient alert and oriented and in no cardiopulmonary distress.  HEENT: No facial asymmetry, EOMI,     Neck supple .  Chest: Clear to auscultation bilaterally.  CVS: S1, S2 no murmurs, no S3.Regular rate.  ABD: Soft non tender.   Ext: No edema  MS: Adequate though reduced  ROM spine, shoulders, hips and knees.  Skin: Intact, no ulcerations or rash noted.  Psych: Good eye contact, normal affect. Memory intact not anxious or depressed appearing.  CNS: CN 2-12 intact, power,  normal throughout.no focal deficits noted.   Assessment & Plan  Bronchitis z pack and tessalon perles and CXR   CKD (chronic kidney disease) stage 3, GFR 30-59 ml/min Increase water intake and repeat  lab in 4 weeks, increase fresh and frozen foods and reduce processed and avoid NSIADS  Allergic sinusitis Chlorpheniramine, as needed, short term for symptom relief  HTN (hypertension) Controlled, no change in medication   Mildly underweight adult Encouraged to intentionally eat more often and larger quantities

## 2020-01-25 LAB — T4, FREE: Free T4: 1.17 ng/dL (ref 0.82–1.77)

## 2020-01-25 LAB — SPECIMEN STATUS REPORT

## 2020-01-25 LAB — T3, FREE: T3, Free: 2.9 pg/mL (ref 2.0–4.4)

## 2020-01-26 ENCOUNTER — Encounter: Payer: Self-pay | Admitting: Family Medicine

## 2020-01-26 DIAGNOSIS — J309 Allergic rhinitis, unspecified: Secondary | ICD-10-CM | POA: Insufficient documentation

## 2020-01-26 NOTE — Assessment & Plan Note (Signed)
Controlled, no change in medication  

## 2020-01-26 NOTE — Assessment & Plan Note (Signed)
Chlorpheniramine, as needed, short term for symptom relief

## 2020-01-26 NOTE — Assessment & Plan Note (Signed)
Encouraged to intentionally eat more often and larger quantities

## 2020-01-26 NOTE — Assessment & Plan Note (Signed)
Increase water intake and repeat  lab in 4 weeks, increase fresh and frozen foods and reduce processed and avoid NSIADS

## 2020-03-19 DIAGNOSIS — N183 Chronic kidney disease, stage 3 unspecified: Secondary | ICD-10-CM | POA: Diagnosis not present

## 2020-03-20 LAB — BMP8+EGFR
BUN/Creatinine Ratio: 16 (ref 12–28)
BUN: 24 mg/dL (ref 10–36)
CO2: 23 mmol/L (ref 20–29)
Calcium: 10.3 mg/dL (ref 8.7–10.3)
Chloride: 105 mmol/L (ref 96–106)
Creatinine, Ser: 1.54 mg/dL — ABNORMAL HIGH (ref 0.57–1.00)
GFR calc Af Amer: 33 mL/min/{1.73_m2} — ABNORMAL LOW (ref >59)
GFR calc non Af Amer: 28 mL/min/{1.73_m2} — ABNORMAL LOW (ref >59)
Glucose: 153 mg/dL — ABNORMAL HIGH (ref 65–99)
Potassium: 4.7 mmol/L (ref 3.5–5.2)
Sodium: 143 mmol/L (ref 134–144)

## 2020-04-09 DIAGNOSIS — B351 Tinea unguium: Secondary | ICD-10-CM | POA: Diagnosis not present

## 2020-04-09 DIAGNOSIS — N189 Chronic kidney disease, unspecified: Secondary | ICD-10-CM | POA: Diagnosis not present

## 2020-04-27 ENCOUNTER — Ambulatory Visit (INDEPENDENT_AMBULATORY_CARE_PROVIDER_SITE_OTHER): Payer: Medicare Other | Admitting: Nurse Practitioner

## 2020-04-27 ENCOUNTER — Other Ambulatory Visit: Payer: Self-pay

## 2020-04-27 ENCOUNTER — Encounter: Payer: Self-pay | Admitting: Nurse Practitioner

## 2020-04-27 DIAGNOSIS — J439 Emphysema, unspecified: Secondary | ICD-10-CM | POA: Diagnosis not present

## 2020-04-27 MED ORDER — MUCINEX DM MAXIMUM STRENGTH 60-1200 MG PO TB12: 1.0000 | ORAL_TABLET | Freq: Two times a day (BID) | ORAL | 0 refills | Status: DC | PRN

## 2020-04-27 NOTE — Progress Notes (Signed)
Acute Office Visit  Subjective:    Patient ID: Kristy Jarvis, female    DOB: 08-Mar-1921, 86 y.o.   MRN: [id number]  Chief Complaint  Patient presents with  . Cough    spitting up clear phlegm     HPI Patient is in today for a cough.  She has had a cough for several years, but it is a little worse today.  She states that she had a coughing spell at the [facility] suggested that she see her PCP.  She saw Dr. Moshe Cipro for this in Sept 2021, and she got x-rays that showed a hiatal hernia as well as emphysema.  Denies fever, fatigue or SOB.  Past Medical History:  Diagnosis Date  . Cataract   . Diverticulosis of colon    Noted by CT  . GERD (gastroesophageal reflux disease)   . Hyperlipidemia   . Intermittent vertigo   . Osteoporosis   . Renal colic     Past Surgical History:  Procedure Laterality Date  . ABDOMINAL HYSTERECTOMY    . BUNIONECTOMY  06/27/2011   left , Dr Berline Lopes  . CATARACT EXTRACTION  2002   left eye   . CHOLECYSTECTOMY  1989  . ESOPHAGOGASTRODUODENOSCOPY  Jan  2007   prominent schatzki's ring, s/p 53F, small to moderate size hh  . EYE SURGERY  2005 approx   cataract left  . HIP SURGERY Left 12/20/2013  . INTRAMEDULLARY (IM) NAIL INTERTROCHANTERIC Left 01/02/2014   Procedure: INTRAMEDULLARY (IM) NAIL INTERTROCHANTRIC;  Surgeon: Newt Minion, MD;  Location: [facility];  Service: Orthopedics;  Laterality: Left;    Family History  Problem Relation Age of Onset  . Heart attack Father   . Diabetes Sister   . Diabetes Sister   . Diabetes Sister   . Parkinson's disease Sister   . Liver cancer Other        family history   . Heart disease Brother   . Prostate cancer Brother   . Colon cancer Neg Hx   . Anesthesia problems Neg Hx   . Hypotension Neg Hx   . Malignant hyperthermia Neg Hx   . Pseudochol deficiency Neg Hx     Social History   Socioeconomic History  . Marital status: Widowed    Spouse name: Not on file  . Number of children:  1  . Years of education: Not on file  . Highest education level: Not on file  Occupational History  . Occupation: retired   Tobacco Use  . Smoking status: Never Smoker  . Smokeless tobacco: Never Used  Vaping Use  . Vaping Use: Never used  Substance and Sexual Activity  . Alcohol use: No  . Drug use: No  . Sexual activity: Never  Other Topics Concern  . Not on file  Social History Narrative   GOES TO [place] DURING THE WEEK DAILY.    Social Determinants of Health   Financial Resource Strain: Low Risk   . Difficulty of Paying Living Expenses: Not hard at all  Food Insecurity: No Food Insecurity  . Worried About Charity fundraiser in the Last Year: Never true  . Ran Out of Food in the Last Year: Never true  Transportation Needs: No Transportation Needs  . Lack of Transportation (Medical): No  . Lack of Transportation (Non-Medical): No  Physical Activity: Sufficiently Active  . Days of Exercise per Week: 5 days  . Minutes of Exercise per Session: 60 min  Stress: No Stress Concern Present  . Feeling of Stress : Not at all  Social Connections: Moderately Integrated  . Frequency of Communication with Friends and Family: More than three times a week  . Frequency of Social Gatherings with Friends and Family: More than three times a week  . Attends Religious Services: More than 4 times per year  . Active Member of Clubs or Organizations: Yes  . Attends Archivist Meetings: More than 4 times per year  . Marital Status: Widowed  Intimate Partner Violence:   . Fear of Current or Ex-Partner: Not on file  . Emotionally Abused: Not on file  . Physically Abused: Not on file  . Sexually Abused: Not on file    Outpatient Medications Prior to Visit  Medication Sig Dispense Refill  . calcium carbonate (TUMS EX) 750 MG chewable tablet Chew 2 tablets by mouth daily.     . chlorpheniramine (CHLOR-TRIMETON) 4 MG tablet Take 1 tablet by mouth once daily for 4  days, then 1 daily as needed 10 tablet 0  . Cholecalciferol (VITAMIN D PO) Take 1 tablet by mouth daily.    . famotidine (PEPCID) 20 MG tablet Take 1 tablet (20 mg total) by mouth daily. 90 tablet 4  . lisinopril (ZESTRIL) 5 MG tablet TAKE (1) TABLET BY MOUTH ONCE DAILY. 90 tablet 4  . lovastatin (MEVACOR) 40 MG tablet TAKE (1) TABLET BY MOUTH AT BEDTIME. 90 tablet 4  . omeprazole (PRILOSEC) 20 MG capsule TAKE (1) CAPSULE BY MOUTH AT BEDTIME. 90 capsule 4  . azithromycin (ZITHROMAX) 250 MG tablet Take 2 tablets on day 1 then take 1 tablet once daily for an additional 4 days (Patient not taking: Reported on 04/27/2020) 6 tablet 0  . benzonatate (TESSALON PERLES) 100 MG capsule Take 1 capsule by mouth once daily for 4 days and 1 daily as needed for cough (Patient not taking: Reported on 04/27/2020) 20 capsule 0   No facility-administered medications prior to visit.    Allergies  Allergen Reactions  . Alendronate Sodium Other (See Comments)    Chest Pain, Fatigue   . Penicillins Rash    Has patient had a PCN reaction causing immediate rash, facial/tongue/throat swelling, SOB or lightheadedness with hypotension: unknown Has patient had a PCN reaction causing severe rash involving mucus membranes or skin necrosis: unknown Has patient had a PCN reaction that required hospitalization no Has patient had a PCN reaction occurring within the last 10 years: no If all of the above answers are "NO", then may proceed with Ceph    Review of Systems  HENT: Positive for postnasal drip. Negative for congestion, rhinorrhea, sinus pressure, sinus pain and sore throat.   Respiratory: Positive for choking.        SOB with exertion at times; no SOB today  Cardiovascular: Negative.        Objective:    Physical Exam Constitutional:      Appearance: Normal appearance.  HENT:     Nose: Nose normal.  Cardiovascular:     Rate and Rhythm: Normal rate and regular rhythm.     Pulses: Normal pulses.     Heart  sounds: Normal heart sounds.  Pulmonary:     Effort: Pulmonary effort is normal.     Breath sounds: Normal breath sounds.  Musculoskeletal:     Cervical back: No tenderness.  Lymphadenopathy:     Cervical: No cervical adenopathy.  Neurological:     Mental Status: She is alert.  BP 135/64   Pulse 80   Temp 97.9 F (36.6 C)   Resp 16   Ht 5' (1.524 m)   Wt 92 lb (41.7 kg)   SpO2 94%   BMI 17.97 kg/m  Wt Readings from Last 3 Encounters:  04/27/20 92 lb (41.7 kg)  01/22/20 90 lb (40.8 kg)  01/01/20 94 lb (42.6 kg)    Health Maintenance Due  Topic Date Due  . TETANUS/TDAP  09/18/2018  . INFLUENZA VACCINE  12/21/2019    There are no preventive care reminders to display for this patient.   Lab Results  Component Value Date   TSH 0.366 (L) 01/01/2020   Lab Results  Component Value Date   WBC 7.3 01/01/2020   HGB 14.8 01/01/2020   HCT 44.9 01/01/2020   MCV 91 01/01/2020   PLT 241 01/01/2020   Lab Results  Component Value Date   NA 143 03/19/2020   K 4.7 03/19/2020   CO2 23 03/19/2020   GLUCOSE 153 (H) 03/19/2020   BUN 24 03/19/2020   CREATININE 1.54 (H) 03/19/2020   BILITOT 0.5 01/01/2020   ALKPHOS 83 01/01/2020   AST 22 01/01/2020   ALT 10 01/01/2020   PROT 7.1 01/01/2020   ALBUMIN 4.4 01/01/2020   CALCIUM 10.3 03/19/2020   ANIONGAP 10 12/18/2016   Lab Results  Component Value Date   CHOL 182 01/01/2020   Lab Results  Component Value Date   HDL 75 01/01/2020   Lab Results  Component Value Date   LDLCALC 87 01/01/2020   Lab Results  Component Value Date   TRIG 115 01/01/2020   Lab Results  Component Value Date   CHOLHDL 2.4 01/01/2020   Lab Results  Component Value Date   HGBA1C 5.9 (H) 03/25/2013       Assessment & Plan:   Problem List Items Addressed This Visit      Respiratory   Emphysema lung ([place])    -diagnosed on x-ray 01/2020 -she is having some cough that has been ongoing, but does not feel ill -[facility] wanted  her to be checked out, but she does not appear ill -Rx. mucinex-Dm, since she states that she is having more clear mucus production than usual -discussed albuterol since she gets SOB with exertion sometimes, but her daughter states that she won't understand the instruction or be able to use the inhaler or nebulizer or may overdose herself -if her breathing gets worse, pt will return to clinic      Relevant Medications   Dextromethorphan-guaiFENesin (MUCINEX DM MAXIMUM STRENGTH) 60-1200 MG TB12       Meds ordered this encounter  Medications  . Dextromethorphan-guaiFENesin (MUCINEX DM MAXIMUM STRENGTH) 60-1200 MG TB12    Sig: Take 1 tablet by mouth every 12 (twelve) hours as needed (congestion).    Dispense:  28 tablet    Refill:  0     Noreene Larsson, NP

## 2020-04-27 NOTE — Assessment & Plan Note (Addendum)
-  diagnosed on x-ray 01/2020 -she is having some cough that has been ongoing, but does not feel ill -[facility] wanted her to be checked out, but she does not appear ill -Rx. mucinex-Dm, since she states that she is having more clear mucus production than usual -discussed albuterol since she gets SOB with exertion sometimes, but her daughter states that she won't understand the instruction or be able to use the inhaler or nebulizer or may overdose herself -if her breathing gets worse, pt will return to clinic

## 2020-04-27 NOTE — Patient Instructions (Signed)
Please drink plenty of water while taking Mucinex-DM.

## 2020-06-10 DIAGNOSIS — H903 Sensorineural hearing loss, bilateral: Secondary | ICD-10-CM | POA: Diagnosis not present

## 2020-06-17 DIAGNOSIS — H903 Sensorineural hearing loss, bilateral: Secondary | ICD-10-CM | POA: Diagnosis not present

## 2020-06-21 DIAGNOSIS — H903 Sensorineural hearing loss, bilateral: Secondary | ICD-10-CM | POA: Diagnosis not present

## 2020-06-21 DIAGNOSIS — H6123 Impacted cerumen, bilateral: Secondary | ICD-10-CM | POA: Diagnosis not present

## 2020-07-06 ENCOUNTER — Encounter: Payer: Medicare Other | Admitting: Family Medicine

## 2020-09-06 ENCOUNTER — Emergency Department (HOSPITAL_COMMUNITY)
Admission: EM | Admit: 2020-09-06 | Discharge: 2020-09-06 | Disposition: A | Discharge status: Home / Self Care | Payer: Medicare HMO | Attending: Emergency Medicine | Admitting: Emergency Medicine

## 2020-09-06 ENCOUNTER — Other Ambulatory Visit: Payer: Self-pay

## 2020-09-06 ENCOUNTER — Emergency Department (HOSPITAL_COMMUNITY): Payer: Medicare HMO

## 2020-09-06 ENCOUNTER — Encounter (HOSPITAL_COMMUNITY): Payer: Self-pay | Admitting: *Deleted

## 2020-09-06 DIAGNOSIS — Z79899 Other long term (current) drug therapy: Secondary | ICD-10-CM | POA: Insufficient documentation

## 2020-09-06 DIAGNOSIS — L03113 Cellulitis of right upper limb: Secondary | ICD-10-CM | POA: Insufficient documentation

## 2020-09-06 DIAGNOSIS — I129 Hypertensive chronic kidney disease with stage 1 through stage 4 chronic kidney disease, or unspecified chronic kidney disease: Secondary | ICD-10-CM | POA: Insufficient documentation

## 2020-09-06 DIAGNOSIS — M25531 Pain in right wrist: Secondary | ICD-10-CM

## 2020-09-06 DIAGNOSIS — N183 Chronic kidney disease, stage 3 unspecified: Secondary | ICD-10-CM | POA: Diagnosis not present

## 2020-09-06 MED ORDER — ACETAMINOPHEN 325 MG PO TABS
650.0000 mg | ORAL_TABLET | Freq: Once | ORAL | Status: AC
  Administered 2020-09-06: 650 mg via ORAL
  Filled 2020-09-06: qty 2

## 2020-09-06 MED ORDER — DOXYCYCLINE HYCLATE 100 MG PO CAPS: 100.0000 mg | ORAL_CAPSULE | Freq: Two times a day (BID) | ORAL | 0 refills | Status: DC

## 2020-09-06 MED ORDER — DOXYCYCLINE HYCLATE 100 MG PO TABS
100.0000 mg | ORAL_TABLET | Freq: Once | ORAL | Status: AC
  Administered 2020-09-06: 100 mg via ORAL
  Filled 2020-09-06: qty 1

## 2020-09-06 NOTE — ED Triage Notes (Signed)
Pt c/o right hand/wrist pain that started in the middle of the night. Pt reports it woke her up during the night and it hurt so bad she was never able to go back to sleep. Denies injury. Redness noted to right hand/wrist.

## 2020-09-06 NOTE — ED Provider Notes (Signed)
[facility] EMERGENCY DEPARTMENT Provider Note   CSN: [id number] Arrival date & time: 09/06/20  1191     History Chief Complaint  Patient presents with  . Hand Pain    Kristy Jarvis is a 87 y.o. female.  Patient presents to ER chief complaint of right hand pain.  Describes it as sharp and achy persistent.  Initiated last night.  Patient had difficult time sleeping due to the pain.  She denies any fevers any cough any vomiting or diarrhea.  Denies any fall or trauma that she can recall.  No history of wrist pain.         Past Medical History:  Diagnosis Date  . Cataract   . Diverticulosis of colon    Noted by CT  . GERD (gastroesophageal reflux disease)   . Hyperlipidemia   . Intermittent vertigo   . Osteoporosis   . Renal colic     Patient Active Problem List   Diagnosis Date Noted  . Emphysema lung ([place]) 04/27/2020  . Allergic sinusitis 01/26/2020  . Mildly underweight adult 01/04/2020  . Bronchitis 05/08/2017  . HTN (hypertension) 12/17/2016  . Dizziness   . Light headedness 11/24/2015  . At high risk for falls 07/05/2015  . CKD (chronic kidney disease) stage 3, GFR 30-59 ml/min (HCC) 05/24/2015  . Right kidney stone 02/22/2015  . Need for immunization against influenza 03/09/2014  . Hyperglycemia 03/25/2013  . MCI (mild cognitive impairment) 10/23/2012  . Hyperlipemia 08/13/2007  . GERD 08/13/2007  . Osteoporosis 08/13/2007  . INTERMITTENT VERTIGO 08/13/2007    Past Surgical History:  Procedure Laterality Date  . ABDOMINAL HYSTERECTOMY    . BUNIONECTOMY  06/27/2011   left , Dr Berline Lopes  . CATARACT EXTRACTION  2002   left eye   . CHOLECYSTECTOMY  1989  . ESOPHAGOGASTRODUODENOSCOPY  Jan  2007   prominent schatzki's ring, s/p 60F, small to moderate size hh  . EYE SURGERY  2005 approx   cataract left  . HIP SURGERY Left 12/20/2013  . INTRAMEDULLARY (IM) NAIL INTERTROCHANTERIC Left 01/02/2014   Procedure: INTRAMEDULLARY (IM) NAIL INTERTROCHANTRIC;   Surgeon: Newt Minion, MD;  Location: [place];  Service: Orthopedics;  Laterality: Left;     OB History   No obstetric history on file.     Family History  Problem Relation Age of Onset  . Heart attack Father   . Diabetes Sister   . Diabetes Sister   . Diabetes Sister   . Parkinson's disease Sister   . Liver cancer Other        family history   . Heart disease Brother   . Prostate cancer Brother   . Colon cancer Neg Hx   . Anesthesia problems Neg Hx   . Hypotension Neg Hx   . Malignant hyperthermia Neg Hx   . Pseudochol deficiency Neg Hx     Social History   Tobacco Use  . Smoking status: Never Smoker  . Smokeless tobacco: Never Used  Vaping Use  . Vaping Use: Never used  Substance Use Topics  . Alcohol use: No  . Drug use: No    Home Medications Prior to Admission medications   Medication Sig Start Date End Date Taking? Authorizing Provider  calcium carbonate (TUMS EX) 750 MG chewable tablet Chew 2 tablets by mouth daily.     [provider]  chlorpheniramine (CHLOR-TRIMETON) 4 MG tablet Take 1 tablet by mouth once daily for 4 days, then 1 daily as needed 01/22/20  Fayrene Helper, MD  Cholecalciferol (VITAMIN D PO) Take 1 tablet by mouth daily.    [provider]  Dextromethorphan-guaiFENesin (MUCINEX DM MAXIMUM STRENGTH) 60-1200 MG TB12 Take 1 tablet by mouth every 12 (twelve) hours as needed (congestion). 04/27/20   Noreene Larsson, NP  famotidine (PEPCID) 20 MG tablet Take 1 tablet (20 mg total) by mouth daily. 12/22/19   Fayrene Helper, MD  lisinopril (ZESTRIL) 5 MG tablet TAKE (1) TABLET BY MOUTH ONCE DAILY. 12/22/19   Fayrene Helper, MD  lovastatin (MEVACOR) 40 MG tablet TAKE (1) TABLET BY MOUTH AT BEDTIME. 12/22/19   Fayrene Helper, MD  omeprazole (PRILOSEC) 20 MG capsule TAKE (1) CAPSULE BY MOUTH AT BEDTIME. 12/22/19   Fayrene Helper, MD    Allergies    Alendronate sodium and Penicillins  Review of Systems   Review of  Systems  Constitutional: Negative for fever.  HENT: Negative for ear pain.   Eyes: Negative for pain.  Respiratory: Negative for cough.   Cardiovascular: Negative for chest pain.  Gastrointestinal: Negative for abdominal pain.  Genitourinary: Negative for flank pain.  Musculoskeletal: Negative for back pain.  Skin: Negative for rash.  Neurological: Negative for headaches.    Physical Exam Updated Vital Signs BP (!) 174/68 (BP Location: Left Arm)   Pulse (!) 59   Temp 98.2 F (36.8 C) (Oral)   Resp 16   Ht 5' (1.524 m)   Wt 42.2 kg   SpO2 100%   BMI 18.16 kg/m   Physical Exam Constitutional:      General: She is not in acute distress.    Appearance: Normal appearance.  HENT:     Head: Normocephalic.     Nose: Nose normal.  Eyes:     Extraocular Movements: Extraocular movements intact.  Cardiovascular:     Rate and Rhythm: Normal rate.  Pulmonary:     Effort: Pulmonary effort is normal.  Musculoskeletal:     Cervical back: Normal range of motion.     Comments: Decreased range of motion of the right wrist secondary to pain.  Patient has an area of cellulitis in the dorsum of the right hand sending down to the right wrist ulnar aspect.  No abscess or other fluctuant lesion noted.  Neurological:     General: No focal deficit present.     Mental Status: She is alert. Mental status is at baseline.     ED Results / Procedures / Treatments   Labs (all labs ordered are listed, but only abnormal results are displayed) Labs Reviewed - No data to display  EKG None  Radiology DG Wrist Complete Right  Result Date: 09/06/2020 CLINICAL DATA:  Right wrist pain and redness.  No injury. EXAM: RIGHT WRIST - COMPLETE 3+ VIEW COMPARISON:  None. FINDINGS: No acute fracture or dislocation. Moderate distal radioulnar joint osteoarthritis. Mild osteoarthritis of the first [facility] joint, first through third MCP joints, and thumb IP joint. Diffuse chondrocalcinosis and periarticular  calcification involving the wrist and MCP joints. Osteopenia. Soft tissues are unremarkable. IMPRESSION: 1. No acute osseous abnormality. 2. CPPD arthropathy. Electronically Signed   By: Titus Dubin M.D.   On: 09/06/2020 08:07    Procedures .Ortho Injury Treatment  Date/Time: 09/06/2020 9:26 AM Performed by: Luna Fuse, MD Authorized by: Luna Fuse, MD  Comments: Wrist brace placed in the right upper extremity.  Neurovascularly intact after placement.      Medications Ordered in ED Medications  acetaminophen (TYLENOL) tablet  650 mg (650 mg Oral Given 09/06/20 0747)  doxycycline (VIBRA-TABS) tablet 100 mg (100 mg Oral Given 09/06/20 0747)    ED Course  I have reviewed the triage vital signs and the nursing notes.  Pertinent labs & imaging results that were available during my care of the patient were reviewed by me and considered in my medical decision making (see chart for details).    MDM Rules/Calculators/A&P                          Presenting with cellulitis and pain along right wrist.  Septic joint considered along with crystalline disease, or arthritis pain, vs cellulitis along other diagnosis.    I do not feel an attempt at aspiration of wrist is warranted given surrounding cellulitis at dorsum of hand extends to dorsal wrist. Cellulitis does not appear to affect the MCP joints or the fingers.  Patient given prescription of doxycycline to go home with.  Advised follow-up with her primary care doctor within 3 to 4 days.  Advised immediate return for increased redness increased pain fevers or any additional concerns.  Final Clinical Impression(s) / ED Diagnoses Final diagnoses:  Right wrist pain  Cellulitis of right upper extremity    Rx / DC Orders ED Discharge Orders    None       Luna Fuse, MD 09/06/20 [phone]

## 2020-09-06 NOTE — Discharge Instructions (Addendum)
Take tylenol 650mg  three times a day for pain, as well as the antibiotics.  Follow up with hand specialist in 2-3 days.  Return if you develop fevers, worsening pain, increased redness or any other concerns.

## 2020-09-16 ENCOUNTER — Other Ambulatory Visit: Payer: Self-pay

## 2020-09-16 ENCOUNTER — Encounter: Payer: Self-pay | Admitting: Family Medicine

## 2020-09-16 ENCOUNTER — Ambulatory Visit (INDEPENDENT_AMBULATORY_CARE_PROVIDER_SITE_OTHER): Payer: Medicare HMO | Admitting: Family Medicine

## 2020-09-16 VITALS — BP 137/72 | HR 72 | Resp 16 | Ht 60.0 in | Wt 91.1 lb

## 2020-09-16 DIAGNOSIS — E559 Vitamin D deficiency, unspecified: Secondary | ICD-10-CM

## 2020-09-16 DIAGNOSIS — I10 Essential (primary) hypertension: Secondary | ICD-10-CM

## 2020-09-16 DIAGNOSIS — R739 Hyperglycemia, unspecified: Secondary | ICD-10-CM

## 2020-09-16 DIAGNOSIS — E785 Hyperlipidemia, unspecified: Secondary | ICD-10-CM | POA: Diagnosis not present

## 2020-09-16 DIAGNOSIS — Z0000 Encounter for general adult medical examination without abnormal findings: Secondary | ICD-10-CM

## 2020-09-16 NOTE — Patient Instructions (Addendum)
F/U in 6 months, call if you need me before  CBC, cmp and eGFR , lipid, hepatic, vit D fasting in hte next 1 week  Please  Careful not to fall  Please consider moving in with your daughter, that is what is safest for you, since you are 87 years old!!!  [facility] to get additional days at the [facility]  Thanks for choosing [facility], we consider it a privelige to serve you.

## 2020-09-18 ENCOUNTER — Encounter: Payer: Self-pay | Admitting: Family Medicine

## 2020-09-18 NOTE — Assessment & Plan Note (Signed)
Annual exam as documented.  Immunization needs are specifically addressed at this visit.  

## 2020-09-18 NOTE — Progress Notes (Signed)
    Kristy Jarvis     MRN: [id number]      DOB: 11-11-1920  HPI: Patient is in for annual physical exam.   PE: BP 137/72   Pulse 72   Resp 16   Ht 5' (1.524 m)   Wt 91 lb 1.9 oz (41.3 kg)   SpO2 96%   BMI 17.80 kg/m   Pleasant  female, alert and oriented x 3, in no cardio-pulmonary distress. Afebrile. HEENT No facial trauma or asymetry. Sinuses non tender.  Extra occullar muscles intact.. External ears normal, . Neck: decreased though adequate ROM, no adenopathy,JVD or thyromegaly.No bruits.  Chest: Clear to ascultation bilaterally.No crackles or wheezes. Non tender to palpation  Breast: Not examined  Cardiovascular system; Heart sounds normal,  S1 and  S2 ,no S3.  No murmur, or thrill.  Peripheral pulses normal.  Abdomen: Soft, non tender, Bowel sounds normal. No guarding, tenderness or rebound.   .   Musculoskeletal exam: Decreased tough adequate  ROM of spine, hips , shoulders and knees. No deformity ,swelling or crepitus noted. No muscle wasting or atrophy.   Neurologic: Cranial nerves 2 to 12 intact. Power, tone ,sensation  normal throughout. No disturbance in gait. No tremor.  Skin: Intact,. Pigmentation normal throughout  Psych; Normal mood and affect.   Assessment & Plan:  Annual physical exam Annual exam as documented. Immunization  needs are specifically addressed at this visit.

## 2020-09-22 DIAGNOSIS — I10 Essential (primary) hypertension: Secondary | ICD-10-CM | POA: Diagnosis not present

## 2020-09-22 DIAGNOSIS — R7301 Impaired fasting glucose: Secondary | ICD-10-CM | POA: Diagnosis not present

## 2020-09-22 DIAGNOSIS — E785 Hyperlipidemia, unspecified: Secondary | ICD-10-CM | POA: Diagnosis not present

## 2020-09-22 DIAGNOSIS — E559 Vitamin D deficiency, unspecified: Secondary | ICD-10-CM | POA: Diagnosis not present

## 2020-09-23 LAB — CMP14+EGFR
ALT: 11 IU/L (ref 0–32)
AST: 20 IU/L (ref 0–40)
Albumin/Globulin Ratio: 1.7 (ref 1.2–2.2)
Albumin: 3.8 g/dL (ref 3.5–4.6)
Alkaline Phosphatase: 81 IU/L (ref 44–121)
BUN/Creatinine Ratio: 14 (ref 12–28)
BUN: 19 mg/dL (ref 10–36)
Bilirubin Total: 0.5 mg/dL (ref 0.0–1.2)
CO2: 24 mmol/L (ref 20–29)
Calcium: 9.7 mg/dL (ref 8.7–10.3)
Chloride: 105 mmol/L (ref 96–106)
Creatinine, Ser: 1.35 mg/dL — ABNORMAL HIGH (ref 0.57–1.00)
Globulin, Total: 2.3 g/dL (ref 1.5–4.5)
Glucose: 147 mg/dL — ABNORMAL HIGH (ref 65–99)
Potassium: 4.1 mmol/L (ref 3.5–5.2)
Sodium: 143 mmol/L (ref 134–144)
Total Protein: 6.1 g/dL (ref 6.0–8.5)
eGFR: 36 mL/min/{1.73_m2} — ABNORMAL LOW (ref >59)

## 2020-09-23 LAB — CBC
Hematocrit: 40.2 % (ref 34.0–46.6)
Hemoglobin: 13.5 g/dL (ref 11.1–15.9)
MCH: 30.2 pg (ref 26.6–33.0)
MCHC: 33.6 g/dL (ref 31.5–35.7)
MCV: 90 fL (ref 79–97)
Platelets: 249 10*3/uL (ref 150–450)
RBC: 4.47 x10E6/uL (ref 3.77–5.28)
RDW: 12.5 % (ref 11.7–15.4)
WBC: 7.5 10*3/uL (ref 3.4–10.8)

## 2020-09-23 LAB — LIPID PANEL
Chol/HDL Ratio: 2.5 ratio (ref 0.0–4.4)
Cholesterol, Total: 155 mg/dL (ref 100–199)
HDL: 63 mg/dL (ref >39)
LDL Chol Calc (NIH): 71 mg/dL (ref 0–99)
Triglycerides: 116 mg/dL (ref 0–149)
VLDL Cholesterol Cal: 21 mg/dL (ref 5–40)

## 2020-09-23 LAB — VITAMIN D 25 HYDROXY (VIT D DEFICIENCY, FRACTURES): Vit D, 25-Hydroxy: 44.6 ng/mL (ref 30.0–100.0)

## 2020-09-25 LAB — HGB A1C W/O EAG: Hgb A1c MFr Bld: 6 % — ABNORMAL HIGH (ref 4.8–5.6)

## 2020-09-25 LAB — SPECIMEN STATUS REPORT

## 2020-10-01 DIAGNOSIS — N189 Chronic kidney disease, unspecified: Secondary | ICD-10-CM | POA: Diagnosis not present

## 2020-10-01 DIAGNOSIS — B351 Tinea unguium: Secondary | ICD-10-CM | POA: Diagnosis not present

## 2020-10-01 DIAGNOSIS — R2681 Unsteadiness on feet: Secondary | ICD-10-CM | POA: Diagnosis not present

## 2020-10-01 DIAGNOSIS — M2041 Other hammer toe(s) (acquired), right foot: Secondary | ICD-10-CM | POA: Diagnosis not present

## 2020-11-03 ENCOUNTER — Ambulatory Visit (INDEPENDENT_AMBULATORY_CARE_PROVIDER_SITE_OTHER): Payer: Medicare HMO

## 2020-11-03 ENCOUNTER — Other Ambulatory Visit: Payer: Self-pay

## 2020-11-03 ENCOUNTER — Ambulatory Visit
Admission: EM | Admit: 2020-11-03 | Discharge: 2020-11-03 | Disposition: A | Discharge status: Home / Self Care | Payer: Medicare HMO | Attending: Family Medicine | Admitting: Family Medicine

## 2020-11-03 DIAGNOSIS — M25532 Pain in left wrist: Secondary | ICD-10-CM

## 2020-11-03 MED ORDER — PREDNISONE 10 MG PO TABS
30.0000 mg | ORAL_TABLET | Freq: Every day | ORAL | 0 refills | Status: DC
Start: 2020-11-03 — End: 2020-12-22

## 2020-11-03 NOTE — ED Provider Notes (Signed)
[place]   [id number] 11/03/20 Arrival Time: 0962  ASSESSMENT & PLAN:  1. Left wrist pain    I have personally viewed the imaging studies ordered this visit. No acute bony abnormalities of L wrist.  Prefers to begin trial of: Meds ordered this encounter  Medications   predniSONE (DELTASONE) 10 MG tablet    Sig: Take 3 tablets (30 mg total) by mouth daily with breakfast.    Dispense:  15 tablet    Refill:  0   Has splint to wear if she feels this is helping.  Recommend:  Follow-up Information     Fayrene Helper, MD.   Specialty: Family Medicine Why: As needed. Contact information: [address] [place] [address] [phone]                Reviewed expectations re: course of current medical issues. Questions answered. Outlined signs and symptoms indicating need for more acute intervention. Patient verbalized understanding. After Visit Summary given.  SUBJECTIVE: History from: patient. Kristy Jarvis is a 87 y.o. female who reports intermittent mild to moderate pain of her left wrist; described as 'feeling swollen and painful'; without radiation. Onset: gradual. First noted:  few months; worse this week . No trauma reported Symptoms have gradually worsened since beginning. Aggravating factors: certain movements. Alleviating factors: have not been identified. Associated symptoms: none reported. Extremity sensation changes or weakness: none. Self treatment: has not tried OTC therapies.   Past Surgical History:  Procedure Laterality Date   ABDOMINAL HYSTERECTOMY     BUNIONECTOMY  06/27/2011   left , Dr Berline Lopes   CATARACT EXTRACTION  2002   left eye    CHOLECYSTECTOMY  1989   ESOPHAGOGASTRODUODENOSCOPY  Jan  2007   prominent schatzki's ring, s/p 40F, small to moderate size hh   EYE SURGERY  2005 approx   cataract left   HIP SURGERY Left 12/20/2013   INTRAMEDULLARY (IM) NAIL INTERTROCHANTERIC Left 01/02/2014    Procedure: INTRAMEDULLARY (IM) NAIL INTERTROCHANTRIC;  Surgeon: Newt Minion, MD;  Location: [place];  Service: Orthopedics;  Laterality: Left;      OBJECTIVE:  Vitals:   11/03/20 0935  BP: (!) 148/77  Pulse: 71  Resp: 18  Temp: 98.2 F (36.8 C)  SpO2: 96%    General appearance: alert; no distress HEENT: [place]; AT Neck: supple with FROM Resp: unlabored respirations Extremities: LUE: warm with well perfused appearance; poorly localized mild to moderate tenderness over left wrist; diffuse; without gross deformities; swelling: minimal; bruising: none; wrist ROM: normal CV: brisk extremity capillary refill of LUE; 2+ radial pulse of LUE. Skin: warm and dry; no visible rashes Neurologic: normal sensation and strength of LUE Psychological: alert and cooperative; normal mood and affect  Imaging: DG Wrist Complete Left  Result Date: 11/03/2020 CLINICAL DATA:  Pain EXAM: LEFT WRIST - COMPLETE 3+ VIEW COMPARISON:  None. FINDINGS: Frontal, oblique, and lateral views were obtained. Bones osteoporotic. No fracture or dislocation. There is narrowing of the scaphotrapezial joint as well as narrowing in the proximal carpal row. There is extensive triangular fibrocartilage calcification. Joint space narrowing also noted in the first MCP joint. There is calcification in the fifth MCP joint. IMPRESSION: No fracture or dislocation. Osteoporosis. Suspect calcium pyrophosphate deposition in the triangular fibrocartilage and fifth MCP joint. Areas of joint space narrowing consistent with osteoarthritis as noted. Electronically Signed   By: Lowella Grip III M.D.   On: 11/03/2020 09:59      Allergies  Allergen Reactions   Alendronate Sodium Other (See Comments)    Chest Pain, Fatigue    Penicillins Rash    Has patient had a PCN reaction causing immediate rash, facial/tongue/throat swelling, SOB or lightheadedness with hypotension: unknown Has patient had a PCN reaction causing severe rash involving  mucus membranes or skin necrosis: unknown Has patient had a PCN reaction that required hospitalization no Has patient had a PCN reaction occurring within the last 10 years: no If all of the above answers are "NO", then may proceed with Ceph    Past Medical History:  Diagnosis Date   Cataract    Diverticulosis of colon    Noted by CT   GERD (gastroesophageal reflux disease)    Hyperlipidemia    Intermittent vertigo    Osteoporosis    Renal colic    Social History   Socioeconomic History   Marital status: Widowed    Spouse name: Not on file   Number of children: 1   Years of education: Not on file   Highest education level: Not on file  Occupational History   Occupation: retired   Tobacco Use   Smoking status: Never   Smokeless tobacco: Never  Vaping Use   Vaping Use: Never used  Substance and Sexual Activity   Alcohol use: No   Drug use: No   Sexual activity: Never  Other Topics Concern   Not on file  Social History Narrative   GOES TO [place].    Social Determinants of Health   Financial Resource Strain: Low Risk    Difficulty of Paying Living Expenses: Not hard at all  Food Insecurity: No Food Insecurity   Worried About Charity fundraiser in the Last Year: Never true   [place] in the Last Year: Never true  Transportation Needs: No Transportation Needs   Lack of Transportation (Medical): No   Lack of Transportation (Non-Medical): No  Physical Activity: Sufficiently Active   Days of Exercise per Week: 5 days   Minutes of Exercise per Session: 60 min  Stress: No Stress Concern Present   Feeling of Stress : Not at all  Social Connections: Moderately Integrated   Frequency of Communication with Friends and Family: More than three times a week   Frequency of Social Gatherings with Friends and Family: More than three times a week   Attends Religious Services: More than 4 times per year   Active Member of [employer] or  Organizations: Yes   Attends Archivist Meetings: More than 4 times per year   Marital Status: Widowed   Family History  Problem Relation Age of Onset   Heart attack Father    Diabetes Sister    Diabetes Sister    Diabetes Sister    Parkinson's disease Sister    Liver cancer Other        family history    Heart disease Brother    Prostate cancer Brother    Colon cancer Neg Hx    Anesthesia problems Neg Hx    Hypotension Neg Hx    Malignant hyperthermia Neg Hx    Pseudochol deficiency Neg Hx    Past Surgical History:  Procedure Laterality Date   ABDOMINAL HYSTERECTOMY     BUNIONECTOMY  06/27/2011   left , Dr Berline Lopes   CATARACT EXTRACTION  2002   left eye    CHOLECYSTECTOMY  1989   ESOPHAGOGASTRODUODENOSCOPY  Jan  2007  prominent schatzki's ring, s/p 48F, small to moderate size hh   EYE SURGERY  2005 approx   cataract left   HIP SURGERY Left 12/20/2013   INTRAMEDULLARY (IM) NAIL INTERTROCHANTERIC Left 01/02/2014   Procedure: INTRAMEDULLARY (IM) NAIL INTERTROCHANTRIC;  Surgeon: Newt Minion, MD;  Location: [place];  Service: Orthopedics;  Laterality: Left;       Vanessa Kick, MD 11/03/20 [phone]

## 2020-11-03 NOTE — ED Triage Notes (Signed)
Pt presents with left wrist pain, denies injury, had imaging for right wrist for similar pain a couple months ago

## 2020-11-04 ENCOUNTER — Encounter: Payer: Self-pay | Admitting: Internal Medicine

## 2020-11-04 ENCOUNTER — Ambulatory Visit (INDEPENDENT_AMBULATORY_CARE_PROVIDER_SITE_OTHER): Payer: Medicare HMO | Admitting: Internal Medicine

## 2020-11-04 VITALS — BP 120/56 | HR 62 | Resp 22 | Ht 60.0 in | Wt 84.4 lb

## 2020-11-04 DIAGNOSIS — R5383 Other fatigue: Secondary | ICD-10-CM

## 2020-11-04 DIAGNOSIS — R0902 Hypoxemia: Secondary | ICD-10-CM | POA: Diagnosis not present

## 2020-11-04 DIAGNOSIS — R7989 Other specified abnormal findings of blood chemistry: Secondary | ICD-10-CM

## 2020-11-04 DIAGNOSIS — M25532 Pain in left wrist: Secondary | ICD-10-CM | POA: Diagnosis not present

## 2020-11-04 LAB — POCT URINALYSIS DIP (CLINITEK)
Glucose, UA: NEGATIVE mg/dL
Leukocytes, UA: NEGATIVE
Nitrite, UA: NEGATIVE
POC PROTEIN,UA: 30 — AB
Spec Grav, UA: 1.02 (ref 1.010–1.025)
Urobilinogen, UA: 0.2 E.U./dL
pH, UA: 5 (ref 5.0–8.0)

## 2020-11-04 NOTE — Progress Notes (Addendum)
Acute Office Visit  Subjective:    Patient ID: Kristy Jarvis, female    DOB: 12/08/1920, 87 y.o.   MRN: [id number]  Chief Complaint  Patient presents with   Extremity Weakness    Pt has been really weak for 2 weeks daughter brought to her house during the day felt like she wasn't eating due to dementia    HPI Patient is in today for evaluation for fatigue for last 2 weeks. Daughter is present during the visit. She brought her mother to her home for 2-3 days and feels that she might not be eating well. Patient went to Urgent care yesterday for left wrist pain and was given Prednisone and has been feeling better.  Of note, her TSH was low in 12/2019. She denies any tremor, palpitations or recent change in weight. She denies any fever or night sweats.  Her HR was labile initially while walking to the room, but later stabilized. Her O2 saturation also dropped to 60s initially, but later improved to 90s and stayed around that. She denies any dyspnea or wheezing.  Past Medical History:  Diagnosis Date   Cataract    Diverticulosis of colon    Noted by CT   GERD (gastroesophageal reflux disease)    Hyperlipidemia    Intermittent vertigo    Osteoporosis    Renal colic     Past Surgical History:  Procedure Laterality Date   ABDOMINAL HYSTERECTOMY     BUNIONECTOMY  06/27/2011   left , Dr Berline Lopes   CATARACT EXTRACTION  2002   left eye    CHOLECYSTECTOMY  1989   ESOPHAGOGASTRODUODENOSCOPY  Jan  2007   prominent schatzki's ring, s/p 63F, small to moderate size hh   EYE SURGERY  2005 approx   cataract left   HIP SURGERY Left 12/20/2013   INTRAMEDULLARY (IM) NAIL INTERTROCHANTERIC Left 01/02/2014   Procedure: INTRAMEDULLARY (IM) NAIL INTERTROCHANTRIC;  Surgeon: Newt Minion, MD;  Location: [facility];  Service: Orthopedics;  Laterality: Left;    Family History  Problem Relation Age of Onset   Heart attack Father    Diabetes Sister    Diabetes Sister    Diabetes Sister     Parkinson's disease Sister    Liver cancer Other        family history    Heart disease Brother    Prostate cancer Brother    Colon cancer Neg Hx    Anesthesia problems Neg Hx    Hypotension Neg Hx    Malignant hyperthermia Neg Hx    Pseudochol deficiency Neg Hx     Social History   Socioeconomic History   Marital status: Widowed    Spouse name: Not on file   Number of children: 1   Years of education: Not on file   Highest education level: Not on file  Occupational History   Occupation: retired   Tobacco Use   Smoking status: Never   Smokeless tobacco: Never  Vaping Use   Vaping Use: Never used  Substance and Sexual Activity   Alcohol use: No   Drug use: No   Sexual activity: Never  Other Topics Concern   Not on file  Social History Narrative   GOES TO [employer].    Social Determinants of Health   Financial Resource Strain: Low Risk    Difficulty of Paying Living Expenses: Not hard at all  Food Insecurity: No Food Insecurity   Worried About Running  Out of Food in the Last Year: Never true   Ran Out of Food in the Last Year: Never true  Transportation Needs: No Transportation Needs   Lack of Transportation (Medical): No   Lack of Transportation (Non-Medical): No  Physical Activity: Sufficiently Active   Days of Exercise per Week: 5 days   Minutes of Exercise per Session: 60 min  Stress: No Stress Concern Present   Feeling of Stress : Not at all  Social Connections: Moderately Integrated   Frequency of Communication with Friends and Family: More than three times a week   Frequency of Social Gatherings with Friends and Family: More than three times a week   Attends Religious Services: More than 4 times per year   Active Member of [employer] or Organizations: Yes   Attends Archivist Meetings: More than 4 times per year   Marital Status: Widowed  Intimate Partner Violence: Not on file    Outpatient Medications Prior  to Visit  Medication Sig Dispense Refill   calcium carbonate (TUMS EX) 750 MG chewable tablet Chew 2 tablets by mouth daily.      chlorpheniramine (CHLOR-TRIMETON) 4 MG tablet Take 1 tablet by mouth once daily for 4 days, then 1 daily as needed 10 tablet 0   Cholecalciferol (VITAMIN D PO) Take 1 tablet by mouth daily.     Dextromethorphan-guaiFENesin (MUCINEX DM MAXIMUM STRENGTH) 60-1200 MG TB12 Take 1 tablet by mouth every 12 (twelve) hours as needed (congestion). 28 tablet 0   famotidine (PEPCID) 20 MG tablet Take 1 tablet (20 mg total) by mouth daily. 90 tablet 4   lisinopril (ZESTRIL) 5 MG tablet TAKE (1) TABLET BY MOUTH ONCE DAILY. 90 tablet 4   lovastatin (MEVACOR) 40 MG tablet TAKE (1) TABLET BY MOUTH AT BEDTIME. 90 tablet 4   omeprazole (PRILOSEC) 20 MG capsule TAKE (1) CAPSULE BY MOUTH AT BEDTIME. 90 capsule 4   predniSONE (DELTASONE) 10 MG tablet Take 3 tablets (30 mg total) by mouth daily with breakfast. 15 tablet 0   No facility-administered medications prior to visit.    Allergies  Allergen Reactions   Alendronate Sodium Other (See Comments)    Chest Pain, Fatigue    Penicillins Rash    Has patient had a PCN reaction causing immediate rash, facial/tongue/throat swelling, SOB or lightheadedness with hypotension: unknown Has patient had a PCN reaction causing severe rash involving mucus membranes or skin necrosis: unknown Has patient had a PCN reaction that required hospitalization no Has patient had a PCN reaction occurring within the last 10 years: no If all of the above answers are "NO", then may proceed with Ceph    Review of Systems  Constitutional:  Positive for fatigue. Negative for chills and fever.  HENT:  Negative for congestion, sinus pressure, sinus pain and sore throat.   Eyes:  Negative for pain and discharge.  Respiratory:  Negative for cough and shortness of breath.   Cardiovascular:  Negative for chest pain and palpitations.  Gastrointestinal:  Negative  for abdominal pain, constipation, diarrhea, nausea and vomiting.  Endocrine: Negative for polydipsia and polyuria.  Genitourinary:  Negative for dysuria and hematuria.  Musculoskeletal:  Positive for arthralgias. Negative for neck pain and neck stiffness.  Skin:  Negative for rash.  Neurological:  Negative for dizziness and weakness.  Psychiatric/Behavioral:  Negative for agitation and behavioral problems.       Objective:    Physical Exam Vitals reviewed.  Constitutional:      General: She is  not in acute distress.    Appearance: She is not diaphoretic.  HENT:     Head: Normocephalic and atraumatic.     Nose: Nose normal.     Mouth/Throat:     Mouth: Mucous membranes are dry.  Eyes:     General: No scleral icterus.    Extraocular Movements: Extraocular movements intact.  Cardiovascular:     Rate and Rhythm: Normal rate and regular rhythm.     Pulses: Normal pulses.     Heart sounds: Normal heart sounds. No murmur heard. Pulmonary:     Breath sounds: Normal breath sounds. No wheezing or rales.  Abdominal:     Palpations: Abdomen is soft.     Tenderness: There is no abdominal tenderness.  Musculoskeletal:     Cervical back: Neck supple. No tenderness.     Right lower leg: No edema.     Left lower leg: No edema.  Skin:    General: Skin is warm.     Findings: No rash.  Neurological:     General: No focal deficit present.     Mental Status: She is alert and oriented to person, place, and time.  Psychiatric:        Mood and Affect: Mood normal.        Behavior: Behavior normal.    BP (!) 175/72 (BP Location: Left Arm, Patient Position: Sitting, Cuff Size: Normal)   Pulse (!) 44   Resp (!) 22   Ht 5' (1.524 m)   Wt 84 lb 6.4 oz (38.3 kg)   SpO2 90%   BMI 16.48 kg/m  Wt Readings from Last 3 Encounters:  11/04/20 84 lb 6.4 oz (38.3 kg)  09/16/20 91 lb 1.9 oz (41.3 kg)  09/06/20 93 lb (42.2 kg)    Health Maintenance Due  Topic Date Due   Zoster Vaccines-  Shingrix (1 of 2) Never done   TETANUS/TDAP  09/18/2018   COVID-19 Vaccine (4 - Booster for Moderna series) 10/01/2020    There are no preventive care reminders to display for this patient.   Lab Results  Component Value Date   TSH 0.366 (L) 01/01/2020   Lab Results  Component Value Date   WBC 7.5 09/22/2020   HGB 13.5 09/22/2020   HCT 40.2 09/22/2020   MCV 90 09/22/2020   PLT 249 09/22/2020   Lab Results  Component Value Date   NA 143 09/22/2020   K 4.1 09/22/2020   CO2 24 09/22/2020   GLUCOSE 147 (H) 09/22/2020   BUN 19 09/22/2020   CREATININE 1.35 (H) 09/22/2020   BILITOT 0.5 09/22/2020   ALKPHOS 81 09/22/2020   AST 20 09/22/2020   ALT 11 09/22/2020   PROT 6.1 09/22/2020   ALBUMIN 3.8 09/22/2020   CALCIUM 9.7 09/22/2020   ANIONGAP 10 12/18/2016   EGFR 36 (L) 09/22/2020   Lab Results  Component Value Date   CHOL 155 09/22/2020   Lab Results  Component Value Date   HDL 63 09/22/2020   Lab Results  Component Value Date   LDLCALC 71 09/22/2020   Lab Results  Component Value Date   TRIG 116 09/22/2020   Lab Results  Component Value Date   CHOLHDL 2.5 09/22/2020   Lab Results  Component Value Date   HGBA1C 6.0 (H) 09/22/2020       Assessment & Plan:   Problem List Items Addressed This Visit   None Visit Diagnoses     Fatigue, unspecified type    -  Primary Likely due to poor PO intake Check BMP Advised to improve fluid intake and avid skipping any meal Check TSH and free T4 Check UA to r/o UTI    Relevant Orders   Basic Metabolic Panel (BMET)   TSH + free T4   Left wrist pain    Likely related to arthritis On short-course of Prednisone Tylenol PRN     Oxygen desaturation     Unclear etiology, patient does not c/o dyspnea Also had labile HR - improved within about 5 mins EKG: Sinus rhythm, HR 62. Nonspecific T wave inversions in precordial leads.     Abnormal TSH in the past Check TSH and free T4   No orders of the defined  types were placed in this encounter.    Lindell Spar, MD

## 2020-11-04 NOTE — Patient Instructions (Signed)
Please take at least 50 ounces of fluid.  Avoid skipping any meal.  We will call you with test results.

## 2020-11-05 ENCOUNTER — Telehealth: Payer: Self-pay

## 2020-11-05 ENCOUNTER — Other Ambulatory Visit: Payer: Self-pay | Admitting: Internal Medicine

## 2020-11-05 DIAGNOSIS — F419 Anxiety disorder, unspecified: Secondary | ICD-10-CM

## 2020-11-05 DIAGNOSIS — E0590 Thyrotoxicosis, unspecified without thyrotoxic crisis or storm: Secondary | ICD-10-CM

## 2020-11-05 LAB — UA/M W/RFLX CULTURE, ROUTINE
Bilirubin, UA: NEGATIVE
Glucose, UA: NEGATIVE
Leukocytes,UA: NEGATIVE
Nitrite, UA: NEGATIVE
RBC, UA: NEGATIVE
Specific Gravity, UA: 1.022 (ref 1.005–1.030)
Urobilinogen, Ur: 0.2 mg/dL (ref 0.2–1.0)
pH, UA: 5.5 (ref 5.0–7.5)

## 2020-11-05 LAB — BASIC METABOLIC PANEL
BUN/Creatinine Ratio: 19 (ref 12–28)
BUN: 30 mg/dL (ref 10–36)
CO2: 18 mmol/L — ABNORMAL LOW (ref 20–29)
Calcium: 10.1 mg/dL (ref 8.7–10.3)
Chloride: 104 mmol/L (ref 96–106)
Creatinine, Ser: 1.59 mg/dL — ABNORMAL HIGH (ref 0.57–1.00)
Glucose: 184 mg/dL — ABNORMAL HIGH (ref 65–99)
Potassium: 3.8 mmol/L (ref 3.5–5.2)
Sodium: 142 mmol/L (ref 134–144)
eGFR: 29 mL/min/{1.73_m2} — ABNORMAL LOW (ref >59)

## 2020-11-05 LAB — TSH+FREE T4
Free T4: 1.13 ng/dL (ref 0.82–1.77)
TSH: 0.328 u[IU]/mL — ABNORMAL LOW (ref 0.450–4.500)

## 2020-11-05 LAB — MICROSCOPIC EXAMINATION
Bacteria, UA: NONE SEEN
Casts: NONE SEEN /lpf

## 2020-11-05 MED ORDER — HYDROXYZINE PAMOATE 25 MG PO CAPS: 25.0000 mg | ORAL_CAPSULE | Freq: Two times a day (BID) | ORAL | 0 refills | Status: DC | PRN

## 2020-11-05 NOTE — Telephone Encounter (Signed)
Rodena Piety notified of lab results

## 2020-11-05 NOTE — Telephone Encounter (Signed)
Patients [name] calling in regards to test results ph# [phone]

## 2020-11-05 NOTE — Telephone Encounter (Signed)
LVM for Kristy Jarvis to call the office regarding lab results

## 2020-11-23 ENCOUNTER — Encounter: Payer: Self-pay | Admitting: Nurse Practitioner

## 2020-11-23 ENCOUNTER — Ambulatory Visit: Payer: Medicare HMO | Admitting: Nurse Practitioner

## 2020-11-23 ENCOUNTER — Other Ambulatory Visit: Payer: Self-pay

## 2020-11-23 VITALS — BP 117/69 | HR 75 | Ht 60.0 in | Wt 90.0 lb

## 2020-11-23 DIAGNOSIS — E0590 Thyrotoxicosis, unspecified without thyrotoxic crisis or storm: Secondary | ICD-10-CM

## 2020-11-23 NOTE — Progress Notes (Signed)
11/23/2020     Endocrinology Consult Note    Subjective:    Patient ID: Kristy Jarvis, female    DOB: [birth year], PCP Fayrene Helper, MD.   Past Medical History:  Diagnosis Date   Cataract    Diverticulosis of colon    Noted by CT   GERD (gastroesophageal reflux disease)    Hyperlipidemia    Intermittent vertigo    Osteoporosis    Renal colic     Past Surgical History:  Procedure Laterality Date   ABDOMINAL HYSTERECTOMY     BUNIONECTOMY  06/27/2011   left , Dr Berline Lopes   CATARACT EXTRACTION  2002   left eye    CHOLECYSTECTOMY  1989   ESOPHAGOGASTRODUODENOSCOPY  Jan  2007   prominent schatzki's ring, s/p 93F, small to moderate size hh   EYE SURGERY  2005 approx   cataract left   HIP SURGERY Left 12/20/2013   INTRAMEDULLARY (IM) NAIL INTERTROCHANTERIC Left 01/02/2014   Procedure: INTRAMEDULLARY (IM) NAIL INTERTROCHANTRIC;  Surgeon: Newt Minion, MD;  Location: [facility];  Service: Orthopedics;  Laterality: Left;    Social History   Socioeconomic History   Marital status: Widowed    Spouse name: Not on file   Number of children: 1   Years of education: Not on file   Highest education level: Not on file  Occupational History   Occupation: retired   Tobacco Use   Smoking status: Never   Smokeless tobacco: Never  Vaping Use   Vaping Use: Never used  Substance and Sexual Activity   Alcohol use: No   Drug use: No   Sexual activity: Never  Other Topics Concern   Not on file  Social History Narrative   GOES TO [employer].    Social Determinants of Health   Financial Resource Strain: Low Risk    Difficulty of Paying Living Expenses: Not hard at all  Food Insecurity: No Food Insecurity   Worried About Charity fundraiser in the Last Year: Never true   [place] in the Last Year: Never true  Transportation Needs: No Transportation Needs   Lack of Transportation (Medical): No   Lack of Transportation  (Non-Medical): No  Physical Activity: Sufficiently Active   Days of Exercise per Week: 5 days   Minutes of Exercise per Session: 60 min  Stress: No Stress Concern Present   Feeling of Stress : Not at all  Social Connections: Moderately Integrated   Frequency of Communication with Friends and Family: More than three times a week   Frequency of Social Gatherings with Friends and Family: More than three times a week   Attends Religious Services: More than 4 times per year   Active Member of [employer] or Organizations: Yes   Attends Archivist Meetings: More than 4 times per year   Marital Status: Widowed    Family History  Problem Relation Age of Onset   Heart attack Father    Diabetes Sister    Diabetes Sister    Diabetes Sister    Parkinson's disease Sister    Liver cancer Other        family history    Heart disease Brother    Prostate cancer Brother    Colon cancer Neg Hx    Anesthesia problems Neg Hx    Hypotension Neg Hx    Malignant hyperthermia Neg Hx    Pseudochol deficiency Neg Hx  Outpatient Encounter Medications as of 11/23/2020  Medication Sig   calcium carbonate (TUMS EX) 750 MG chewable tablet Chew 2 tablets by mouth daily.    Cholecalciferol (VITAMIN D PO) Take 1 tablet by mouth daily.   Dextromethorphan-guaiFENesin (MUCINEX DM MAXIMUM STRENGTH) 60-1200 MG TB12 Take 1 tablet by mouth every 12 (twelve) hours as needed (congestion).   famotidine (PEPCID) 20 MG tablet Take 1 tablet (20 mg total) by mouth daily.   hydrOXYzine (VISTARIL) 25 MG capsule Take 1 capsule (25 mg total) by mouth every 12 (twelve) hours as needed for anxiety.   lisinopril (ZESTRIL) 5 MG tablet TAKE (1) TABLET BY MOUTH ONCE DAILY.   lovastatin (MEVACOR) 40 MG tablet TAKE (1) TABLET BY MOUTH AT BEDTIME.   omeprazole (PRILOSEC) 20 MG capsule TAKE (1) CAPSULE BY MOUTH AT BEDTIME.   predniSONE (DELTASONE) 10 MG tablet Take 3 tablets (30 mg total) by mouth daily with breakfast.   No  facility-administered encounter medications on file as of 11/23/2020.    ALLERGIES: Allergies  Allergen Reactions   Alendronate Sodium Other (See Comments)    Chest Pain, Fatigue    Penicillins Rash    Has patient had a PCN reaction causing immediate rash, facial/tongue/throat swelling, SOB or lightheadedness with hypotension: unknown Has patient had a PCN reaction causing severe rash involving mucus membranes or skin necrosis: unknown Has patient had a PCN reaction that required hospitalization no Has patient had a PCN reaction occurring within the last 10 years: no If all of the above answers are "NO", then may proceed with Ceph    VACCINATION STATUS: Immunization History  Administered Date(s) Administered   Fluad Quad(high Dose 65+) 03/24/2019   Influenza Split 02/22/2011, 02/14/2012   Influenza,inj,Quad PF,6+ Mos 03/05/2013, 03/09/2014, 03/09/2015, 01/23/2017, 03/06/2018   Moderna Sars-Covid-2 Vaccination 07/14/2019, 08/11/2019, 06/03/2020   PPD Test 01/01/2017   Pneumococcal Conjugate-13 07/13/2014   Pneumococcal Polysaccharide-23 09/12/2010   Td 09/17/2008     HPI  Kristy Jarvis is 87 y.o. female who presents today, accompanied by her daughter, with a medical history as above. she is being seen in consultation for hyperthyroidism requested by Fayrene Helper, MD.  she has been dealing with symptoms of fatigue, generalized weakness, and palpitations or chest pain for 2 weeks or so. her most recent thyroid labs revealed marginally suppressed TSH of 0.328 on 11/04/20 with normal Free T4 of 1.13 on 11/04/20.  she denies dysphagia, choking, shortness of breath, no recent voice change.    she denies family history of thyroid dysfunction and denies family hx of thyroid cancer. she denies personal history of goiter. she is not on any anti-thyroid medications nor on any thyroid hormone supplements. Denies use of Biotin containing supplements.  she is willing to proceed with  appropriate work up and therapy for thyrotoxicosis.  She has never had any imaging of her thyroid in the past.  Review of systems  Constitutional: + Minimally fluctuating body weight, current Body mass index is 17.58 kg/m., + fatigue, no subjective hyperthermia, no subjective hypothermia Eyes: no blurry vision, no xerophthalmia ENT: no sore throat, no nodules palpated in throat, no dysphagia/odynophagia, no hoarseness Cardiovascular: + chest pain (2 weeks ago- was checked out by PCP), no shortness of breath, + palpitations (daughter thinks she is confusing palpations with her chest pain she felt 2 weeks ago), no leg swelling Respiratory: no cough, no shortness of breath Gastrointestinal: no nausea/vomiting/diarrhea Musculoskeletal: no muscle/joint aches, + generalized weakness- reports difficulty getting off the couch Skin: no  rashes, no hyperemia Neurological: mild intermittent tremors, no numbness, no tingling, no dizziness Psychiatric: no depression, + anxiety (recently lost her sister- improved with hydroxyzine)   Objective:    BP 117/69   Pulse 75   Ht 5' (1.524 m)   Wt 90 lb (40.8 kg)   BMI 17.58 kg/m   Wt Readings from Last 3 Encounters:  11/23/20 90 lb (40.8 kg)  11/04/20 84 lb 6.4 oz (38.3 kg)  09/16/20 91 lb 1.9 oz (41.3 kg)     BP Readings from Last 3 Encounters:  11/23/20 117/69  11/04/20 (!) 120/56  11/03/20 (!) 148/77                        Physical Exam- Limited  Constitutional:  Body mass index is 17.58 kg/m. , not in acute distress, normal state of mind Eyes:  EOMI, no exophthalmos Neck: Supple Thyroid: No gross goiter Cardiovascular: RRR, no murmurs, rubs, or gallops, no edema Respiratory: Adequate breathing efforts, no crackles, rales, rhonchi, or wheezing Musculoskeletal: no gross deformities, strength intact in all four extremities, no gross restriction of joint movements, ambulates with walker Skin:  no rashes, no hyperemia Neurological: slight  tremor with outstretched hands   CMP     Component Value Date/Time   NA 142 11/04/2020 0957   K 3.8 11/04/2020 0957   CL 104 11/04/2020 0957   CO2 18 (L) 11/04/2020 0957   GLUCOSE 184 (H) 11/04/2020 0957   GLUCOSE 95 03/28/2019 0828   BUN 30 11/04/2020 0957   CREATININE 1.59 (H) 11/04/2020 0957   CREATININE 1.27 (H) 03/28/2019 0828   CALCIUM 10.1 11/04/2020 0957   PROT 6.1 09/22/2020 0830   ALBUMIN 3.8 09/22/2020 0830   AST 20 09/22/2020 0830   ALT 11 09/22/2020 0830   ALKPHOS 81 09/22/2020 0830   BILITOT 0.5 09/22/2020 0830   GFRNONAA 28 (L) 03/19/2020 0852   GFRNONAA 36 (L) 03/28/2019 0828   GFRAA 33 (L) 03/19/2020 0852   GFRAA 42 (L) 03/28/2019 0828     CBC    Component Value Date/Time   WBC 7.5 09/22/2020 0830   WBC 6.3 03/28/2019 0828   RBC 4.47 09/22/2020 0830   RBC 4.61 03/28/2019 0828   HGB 13.5 09/22/2020 0830   HCT 40.2 09/22/2020 0830   PLT 249 09/22/2020 0830   MCV 90 09/22/2020 0830   MCH 30.2 09/22/2020 0830   MCH 29.9 03/28/2019 0828   MCHC 33.6 09/22/2020 0830   MCHC 33.0 03/28/2019 0828   RDW 12.5 09/22/2020 0830   LYMPHSABS 3.2 06/07/2015 1536   MONOABS 1.0 06/07/2015 1536   EOSABS 0.2 06/07/2015 1536   BASOSABS 0.1 06/07/2015 1536     Diabetic Labs (most recent): Lab Results  Component Value Date   HGBA1C 6.0 (H) 09/22/2020   HGBA1C 5.9 (H) 03/25/2013    Lipid Panel     Component Value Date/Time   CHOL 155 09/22/2020 0830   TRIG 116 09/22/2020 0830   HDL 63 09/22/2020 0830   CHOLHDL 2.5 09/22/2020 0830   CHOLHDL 2.7 03/28/2019 0828   VLDL 31 (H) 08/03/2016 1001   LDLCALC 71 09/22/2020 0830   LDLCALC 89 03/28/2019 0828   LDLDIRECT 85 06/17/2012 0722   LABVLDL 21 09/22/2020 0830     Lab Results  Component Value Date   TSH 0.328 (L) 11/04/2020   TSH 0.366 (L) 01/01/2020   TSH 1.34 03/28/2019   TSH 1.35 03/12/2018   TSH 2.09 12/16/2015  TSH 0.584 07/13/2014   TSH 2.765 07/23/2013   TSH 1.536 03/03/2011   FREET4 1.13  11/04/2020   FREET4 1.17 01/01/2020        Assessment & Plan:   1. Subclinical hyperthyroidism  she is being seen at a kind request of Fayrene Helper, MD.  her history and most recent labs are reviewed, and she was examined clinically. However, more information is needed to determine if thyroid dysfunction is the primary cause of her fatigue and weakness which is unlikely. The potential risks of untreated thyrotoxicosis and the need for definitive therapy have been discussed in detail with her, and she agrees to proceed with diagnostic workup and treatment plan.   I will repeat full profile thyroid function tests today, including antibody testing for autoimmune thyroid dysfunction.  Will hold off on thyroid uptake and scan due to mild symptoms with only marginally suppressed TSH.  She does not wish to proceed with invasive testing due to her age.   she will return in 4 weeks to discuss repeat labs.   I did not initiate any new prescriptions at today's visit.    -Patient is advised to maintain close follow up with Fayrene Helper, MD for primary care needs.   - Time spent with the patient: 60 minutes, of which >50% was spent in obtaining information about her symptoms, reviewing her previous labs, evaluations, and treatments, counseling her about her hyperthyroidism , and developing a plan to confirm the diagnosis and long term treatment as necessary. Please refer to "Patient Self Inventory" in the Media tab for reviewed elements of pertinent patient history.  Kristy Jarvis participated in the discussions, expressed understanding, and voiced agreement with the above plans.  All questions were answered to her satisfaction. she is encouraged to contact clinic should she have any questions or concerns prior to her return visit.   Follow up plan: Return in about 4 weeks (around 12/21/2020) for Thyroid follow up, Previsit labs.   Thank you for involving me in the care of this  pleasant patient, and I will continue to update you with her progress.    Rayetta Pigg, [facility] [address] [place], [place] [address] Phone: [phone] Fax: [phone]  11/23/2020, 2:10 PM

## 2020-11-23 NOTE — Patient Instructions (Signed)
Hyperthyroidism  Hyperthyroidism is when the thyroid gland is too active (overactive). The thyroid gland is a small gland located in the lower front part of the neck, just in front of the windpipe (trachea). This gland makes hormones that help control how the body uses food for energy (metabolism) as well as how the heart and brain function. These hormones also play a role in keeping your bones strong. When the thyroid is overactive, it produces toomuch of a hormone called thyroxine. What are the causes? This condition may be caused by: Graves' disease. This is a disorder in which the body's disease-fighting system (immune system) attacks the thyroid gland. This is the most common cause. Inflammation of the thyroid gland. A tumor in the thyroid gland. Use of certain medicines, including: Prescription thyroid hormone replacement. Herbal supplements that mimic thyroid hormones. Amiodarone therapy. Solid or fluid-filled lumps within your thyroid gland (thyroid nodules). Taking in a large amount of iodine from foods or medicines. What increases the risk? You are more likely to develop this condition if: You are female. You have a family history of thyroid conditions. You smoke tobacco. You use a medicine called lithium. You take medicines that affect the immune system (immunosuppressants). What are the signs or symptoms? Symptoms of this condition include: Nervousness. Inability to tolerate heat. Unexplained weight loss. Diarrhea. Change in the texture of hair or skin. Heart skipping beats or making extra beats. Rapid heart rate. Loss of menstruation. Shaky hands. Fatigue. Restlessness. Sleep problems. Enlarged thyroid gland or a lump in the thyroid (nodule). You may also have symptoms of Graves' disease, which may include: Protruding eyes. Dry eyes. Red or swollen eyes. Problems with vision. How is this diagnosed? This condition may be diagnosed based on: Your symptoms and  medical history. A physical exam. Blood tests. Thyroid ultrasound. This test involves using sound waves to produce images of the thyroid gland. A thyroid scan. A radioactive substance is injected into a vein, and images show how much iodine is present in the thyroid. Radioactive iodine uptake test (RAIU). A small amount of radioactive iodine is given by mouth to see how much iodine the thyroid absorbs after a certain amount of time. How is this treated? Treatment depends on the cause and severity of the condition. Treatment may include: Medicines to reduce the amount of thyroid hormone your body makes. Radioactive iodine treatment (radioiodine therapy). This involves swallowing a small dose of radioactive iodine, in capsule or liquid form, to kill thyroid cells. Surgery to remove part or all of your thyroid gland. You may need to take thyroid hormone replacement medicine for the rest of your life after thyroid surgery. Medicines to help manage your symptoms. Follow these instructions at home:  Take over-the-counter and prescription medicines only as told by your health care provider. Do not use any products that contain nicotine or tobacco, such as cigarettes and e-cigarettes. If you need help quitting, ask your health care provider. Follow any instructions from your health care provider about diet. You may be instructed to limit foods that contain iodine. Keep all follow-up visits as told by your health care provider. This is important. You will need to have blood tests regularly so that your health care provider can monitor your condition. Contact a health care provider if: Your symptoms do not get better with treatment. You have a fever. You are taking thyroid hormone replacement medicine and you: Have symptoms of depression. Feel like you are tired all the time. Gain weight. Get help right  away if: You have chest pain. You have decreased alertness or a change in your awareness. You  have abdominal pain. You feel dizzy. You have a rapid heartbeat. You have an irregular heartbeat. You have difficulty breathing. Summary The thyroid gland is a small gland located in the lower front part of the neck, just in front of the windpipe (trachea). Hyperthyroidism is when the thyroid gland is too active (overactive) and produces too much of a hormone called thyroxine. The most common cause is Graves' disease, a disorder in which your immune system attacks the thyroid gland. Hyperthyroidism can cause various symptoms, such as unexplained weight loss, nervousness, inability to tolerate heat, or changes in your heartbeat. Treatment may include medicine to reduce the amount of thyroid hormone your body makes, radioiodine therapy, surgery, or medicines to manage symptoms. This information is not intended to replace advice given to you by your health care provider. Make sure you discuss any questions you have with your healthcare provider. Document Revised: 01/22/2020 Document Reviewed: 01/22/2020 Elsevier Patient Education  2022 [employer].

## 2020-12-22 ENCOUNTER — Ambulatory Visit (INDEPENDENT_AMBULATORY_CARE_PROVIDER_SITE_OTHER): Payer: Medicare HMO

## 2020-12-22 ENCOUNTER — Telehealth: Payer: Self-pay

## 2020-12-22 ENCOUNTER — Other Ambulatory Visit: Payer: Self-pay

## 2020-12-22 VITALS — BP 139/82 | HR 83 | Temp 98.6°F | Resp 16 | Ht 62.0 in | Wt 90.0 lb

## 2020-12-22 DIAGNOSIS — Z0000 Encounter for general adult medical examination without abnormal findings: Secondary | ICD-10-CM

## 2020-12-22 DIAGNOSIS — Z23 Encounter for immunization: Secondary | ICD-10-CM

## 2020-12-22 NOTE — Telephone Encounter (Signed)
At [facility] in office today 12/22/20 daughter expressed some concerns about pt memory, and fatigue. Daughter wanted me to make you aware she also has a cough that she has had for atleast 6 months and coughs up phlegm at times in the morning and daughter has been giving her mucinex for this. Daughter also states pt was put on hydroxyzine for anxiety and it was very strong and she slept for 18hrs, she feels like she does need something for anxiety and depression since recent move to live with her daughter.  [facility] this.

## 2020-12-22 NOTE — Patient Instructions (Signed)
Kristy Jarvis , Thank you for taking time to come for your Medicare Wellness Visit. I appreciate your ongoing commitment to your health goals. Please review the following plan we discussed and let me know if I can assist you in the future.   Screening recommendations/referrals: Colonoscopy: No longer required  Mammogram: No longer required  Bone Density: No longer required  Recommended yearly ophthalmology/optometry visit for glaucoma screening and checkup Recommended yearly dental visit for hygiene and checkup  Vaccinations: Influenza vaccine: DUE Fall 2022  Pneumococcal vaccine: Complete   Tdap vaccine: Today's visit 12/22/20  Shingles vaccine: Eligible      Advanced directives: Yes    Conditions/risks identified: Cough x 6 month, clearing throat with phlegm. Anxiety and depression will discuss with PCP.   Next appointment: 02/22/2021 @ 8:00am with Dr. Moshe Cipro    Preventive Care 65 Years and Older, Female Preventive care refers to lifestyle choices and visits with your health care provider that can promote health and wellness. What does preventive care include? A yearly physical exam. This is also called an annual well check. Dental exams once or twice a year. Routine eye exams. Ask your health care provider how often you should have your eyes checked. Personal lifestyle choices, including: Daily care of your teeth and gums. Regular physical activity. Eating a healthy diet. Avoiding tobacco and drug use. Limiting alcohol use. Practicing safe sex. Taking low-dose aspirin every day. Taking vitamin and mineral supplements as recommended by your health care provider. What happens during an annual well check? The services and screenings done by your health care provider during your annual well check will depend on your age, overall health, lifestyle risk factors, and family history of disease. Counseling  Your health care provider may ask you questions about your: Alcohol  use. Tobacco use. Drug use. Emotional well-being. Home and relationship well-being. Sexual activity. Eating habits. History of falls. Memory and ability to understand (cognition). Work and work Statistician. Reproductive health. Screening  You may have the following tests or measurements: Height, weight, and BMI. Blood pressure. Lipid and cholesterol levels. These may be checked every 5 years, or more frequently if you are over 4 years old. Skin check. Lung cancer screening. You may have this screening every year starting at age 65 if you have a 30-pack-year history of smoking and currently smoke or have quit within the past 15 years. Fecal occult blood test (FOBT) of the stool. You may have this test every year starting at age 56. Flexible sigmoidoscopy or colonoscopy. You may have a sigmoidoscopy every 5 years or a colonoscopy every 10 years starting at age 55. Hepatitis C blood test. Hepatitis B blood test. Sexually transmitted disease (STD) testing. Diabetes screening. This is done by checking your blood sugar (glucose) after you have not eaten for a while (fasting). You may have this done every 1-3 years. Bone density scan. This is done to screen for osteoporosis. You may have this done starting at age 80. Mammogram. This may be done every 1-2 years. Talk to your health care provider about how often you should have regular mammograms. Talk with your health care provider about your test results, treatment options, and if necessary, the need for more tests. Vaccines  Your health care provider may recommend certain vaccines, such as: Influenza vaccine. This is recommended every year. Tetanus, diphtheria, and acellular pertussis (Tdap, Td) vaccine. You may need a Td booster every 10 years. Zoster vaccine. You may need this after age 21. Pneumococcal 13-valent conjugate (PCV13)  vaccine. One dose is recommended after age 51. Pneumococcal polysaccharide (PPSV23) vaccine. One dose is  recommended after age 90. Talk to your health care provider about which screenings and vaccines you need and how often you need them. This information is not intended to replace advice given to you by your health care provider. Make sure you discuss any questions you have with your health care provider. Document Released: 06/04/2015 Document Revised: 01/26/2016 Document Reviewed: 03/09/2015 Elsevier Interactive Patient Education  2017 [place] Prevention in the Home Falls can cause injuries. They can happen to people of all ages. There are many things you can do to make your home safe and to help prevent falls. What can I do on the outside of my home? Regularly fix the edges of walkways and driveways and fix any cracks. Remove anything that might make you trip as you walk through a door, such as a raised step or threshold. Trim any bushes or trees on the path to your home. Use bright outdoor lighting. Clear any walking paths of anything that might make someone trip, such as rocks or tools. Regularly check to see if handrails are loose or broken. Make sure that both sides of any steps have handrails. Any raised decks and porches should have guardrails on the edges. Have any leaves, snow, or ice cleared regularly. Use sand or salt on walking paths during winter. Clean up any spills in your garage right away. This includes oil or grease spills. What can I do in the bathroom? Use night lights. Install grab bars by the toilet and in the tub and shower. Do not use towel bars as grab bars. Use non-skid mats or decals in the tub or shower. If you need to sit down in the shower, use a plastic, non-slip stool. Keep the floor dry. Clean up any water that spills on the floor as soon as it happens. Remove soap buildup in the tub or shower regularly. Attach bath mats securely with double-sided non-slip rug tape. Do not have throw rugs and other things on the floor that can make you  trip. What can I do in the bedroom? Use night lights. Make sure that you have a light by your bed that is easy to reach. Do not use any sheets or blankets that are too big for your bed. They should not hang down onto the floor. Have a firm chair that has side arms. You can use this for support while you get dressed. Do not have throw rugs and other things on the floor that can make you trip. What can I do in the kitchen? Clean up any spills right away. Avoid walking on wet floors. Keep items that you use a lot in easy-to-reach places. If you need to reach something above you, use a strong step stool that has a grab bar. Keep electrical cords out of the way. Do not use floor polish or wax that makes floors slippery. If you must use wax, use non-skid floor wax. Do not have throw rugs and other things on the floor that can make you trip. What can I do with my stairs? Do not leave any items on the stairs. Make sure that there are handrails on both sides of the stairs and use them. Fix handrails that are broken or loose. Make sure that handrails are as long as the stairways. Check any carpeting to make sure that it is firmly attached to the stairs. Fix any carpet that is loose  or worn. Avoid having throw rugs at the top or bottom of the stairs. If you do have throw rugs, attach them to the floor with carpet tape. Make sure that you have a light switch at the top of the stairs and the bottom of the stairs. If you do not have them, ask someone to add them for you. What else can I do to help prevent falls? Wear shoes that: Do not have high heels. Have rubber bottoms. Are comfortable and fit you well. Are closed at the toe. Do not wear sandals. If you use a stepladder: Make sure that it is fully opened. Do not climb a closed stepladder. Make sure that both sides of the stepladder are locked into place. Ask someone to hold it for you, if possible. Clearly mark and make sure that you can  see: Any grab bars or handrails. First and last steps. Where the edge of each step is. Use tools that help you move around (mobility aids) if they are needed. These include: Canes. Walkers. Scooters. Crutches. Turn on the lights when you go into a dark area. Replace any light bulbs as soon as they burn out. Set up your furniture so you have a clear path. Avoid moving your furniture around. If any of your floors are uneven, fix them. If there are any pets around you, be aware of where they are. Review your medicines with your doctor. Some medicines can make you feel dizzy. This can increase your chance of falling. Ask your doctor what other things that you can do to help prevent falls. This information is not intended to replace advice given to you by your health care provider. Make sure you discuss any questions you have with your health care provider. Document Released: 03/04/2009 Document Revised: 10/14/2015 Document Reviewed: 06/12/2014 Elsevier Interactive Patient Education  2017 [employer].

## 2020-12-22 NOTE — Progress Notes (Signed)
Subjective:   Kristy Jarvis is a 87 y.o. female who presents for Medicare Annual (Subsequent) preventive examination.         Objective:    There were no vitals filed for this visit. There is no height or weight on file to calculate BMI.  Advanced Directives 09/06/2020 12/22/2019 11/12/2017 12/17/2016 12/17/2016 07/20/2016 11/24/2015  Does Patient Have a Medical Advance Directive? Yes Yes Yes Yes Yes Yes Yes  Type of Paramedic of [place];Living will - [place];Living will Living will;Healthcare Power of Attorney Living will;Healthcare Power of [place];Living will Living will  Does patient want to make changes to medical advance directive? - No - Patient declined No - Patient declined No - Patient declined - No - Patient declined -  Copy of [place] in Chart? No - copy requested - Yes No - copy requested - Yes No - copy requested  Would patient like information on creating a medical advance directive? - - - - - - -  Pre-existing out of facility DNR order (yellow form or pink MOST form) - - - - - - -    Current Medications (verified) Outpatient Encounter Medications as of 12/22/2020  Medication Sig   calcium carbonate (TUMS EX) 750 MG chewable tablet Chew 2 tablets by mouth daily.    Cholecalciferol (VITAMIN D PO) Take 1 tablet by mouth daily.   Dextromethorphan-guaiFENesin (MUCINEX DM MAXIMUM STRENGTH) 60-1200 MG TB12 Take 1 tablet by mouth every 12 (twelve) hours as needed (congestion).   famotidine (PEPCID) 20 MG tablet Take 1 tablet (20 mg total) by mouth daily.   hydrOXYzine (VISTARIL) 25 MG capsule Take 1 capsule (25 mg total) by mouth every 12 (twelve) hours as needed for anxiety.   lisinopril (ZESTRIL) 5 MG tablet TAKE (1) TABLET BY MOUTH ONCE DAILY.   lovastatin (MEVACOR) 40 MG tablet TAKE (1) TABLET BY MOUTH AT BEDTIME.   omeprazole (PRILOSEC) 20 MG capsule TAKE (1) CAPSULE BY MOUTH AT  BEDTIME.   predniSONE (DELTASONE) 10 MG tablet Take 3 tablets (30 mg total) by mouth daily with breakfast.   No facility-administered encounter medications on file as of 12/22/2020.    Allergies (verified) Alendronate sodium and Penicillins   History: Past Medical History:  Diagnosis Date   Cataract    Diverticulosis of colon    Noted by CT   GERD (gastroesophageal reflux disease)    Hyperlipidemia    Intermittent vertigo    Osteoporosis    Renal colic    Past Surgical History:  Procedure Laterality Date   ABDOMINAL HYSTERECTOMY     BUNIONECTOMY  06/27/2011   left , Dr Berline Lopes   CATARACT EXTRACTION  2002   left eye    CHOLECYSTECTOMY  1989   ESOPHAGOGASTRODUODENOSCOPY  Jan  2007   prominent schatzki's ring, s/p 48F, small to moderate size hh   EYE SURGERY  2005 approx   cataract left   HIP SURGERY Left 12/20/2013   INTRAMEDULLARY (IM) NAIL INTERTROCHANTERIC Left 01/02/2014   Procedure: INTRAMEDULLARY (IM) NAIL INTERTROCHANTRIC;  Surgeon: Newt Minion, MD;  Location: [facility];  Service: Orthopedics;  Laterality: Left;   Family History  Problem Relation Age of Onset   Heart attack Father    Diabetes Sister    Diabetes Sister    Diabetes Sister    Parkinson's disease Sister    Liver cancer Other        family history  Heart disease Brother    Prostate cancer Brother    Colon cancer Neg Hx    Anesthesia problems Neg Hx    Hypotension Neg Hx    Malignant hyperthermia Neg Hx    Pseudochol deficiency Neg Hx    Social History   Socioeconomic History   Marital status: Widowed    Spouse name: Not on file   Number of children: 1   Years of education: Not on file   Highest education level: Not on file  Occupational History   Occupation: retired   Tobacco Use   Smoking status: Never   Smokeless tobacco: Never  Vaping Use   Vaping Use: Never used  Substance and Sexual Activity   Alcohol use: No   Drug use: No   Sexual activity: Never  Other Topics Concern   Not on  file  Social History Narrative   GOES TO [place].    Social Determinants of Health   Financial Resource Strain: Not on file  Food Insecurity: Not on file  Transportation Needs: Not on file  Physical Activity: Not on file  Stress: Not on file  Social Connections: Not on file    Tobacco Counseling Counseling given: Not Answered                  Diabetic?No          Activities of Daily Living No flowsheet data found.  Patient Care Team: Fayrene Helper, MD as PCP - General Rourk, Cristopher Estimable, MD (Gastroenterology) Alyson Ingles Candee Furbish, MD as Consulting Physician (Urology) Cristal Deer, DPM as Attending Physician (Podiatry) Carole Civil, MD as Consulting Physician (Orthopedic Surgery) Leta Baptist, MD as Consulting Physician (Otolaryngology)  Indicate any recent Medical Services you may have received from other than [facility] providers in the past year (date may be approximate).     Assessment:   This is a routine wellness examination for [facility].  Hearing/Vision screen No results found.  Dietary issues and exercise activities discussed:     Goals Addressed   None   Depression Screen PHQ 2/9 Scores 11/04/2020 04/27/2020 01/01/2020 12/22/2019 03/24/2019 11/19/2018 03/06/2018  PHQ - 2 Score 0 0 0 0 0 0 0  PHQ- 9 Score - - - - - - -    Fall Risk Fall Risk  11/23/2020 11/04/2020 09/16/2020 04/27/2020 01/22/2020  Falls in the past year? 0 0 0 0 1  Number falls in past yr: 0 0 0 0 0  Injury with Fall? - 0 0 0 0  Risk Factor Category  - - - - -  Risk for fall due to : - No Fall Risks - No Fall Risks -  Follow up Falls evaluation completed Falls evaluation completed - Falls evaluation completed -    FALL RISK PREVENTION PERTAINING TO THE HOME:  Any stairs in or around the home? Yes  If so, are there any without handrails? Yes  Home free of loose throw rugs in walkways, pet beds, electrical cords, etc? Yes  Adequate  lighting in your home to reduce risk of falls? Yes   ASSISTIVE DEVICES UTILIZED TO PREVENT FALLS:  Life alert? Yes  Use of a cane, walker or w/c? Yes  Grab bars in the bathroom? No  Shower chair or bench in shower? No  Elevated toilet seat or a handicapped toilet? No   TIMED UP AND GO:  Was the test performed? Yes .  Length of time to  ambulate 10 feet: 40 sec.   Gait slow and steady with assistive device  Cognitive Function: MMSE - Mini Mental State Exam 07/20/2016 07/13/2014  Orientation to time 3 5  Orientation to Place 5 5  Registration 3 3  Attention/ Calculation 5 5  Recall 1 2  Language- name 2 objects 2 2  Language- repeat 1 1  Language- follow 3 step command 3 3  Language- read & follow direction 1 1  Write a sentence 1 1  Copy design 1 1  Total score 26 29     6CIT Screen 12/22/2019 11/19/2018 11/12/2017  What Year? 4 points 0 points 0 points  What month? 0 points 0 points 0 points  What time? 0 points 0 points 0 points  Count back from 20 0 points 0 points 0 points  Months in reverse 0 points 4 points 4 points  Repeat phrase 2 points 0 points 4 points  Total Score 6 4 8     Immunizations Immunization History  Administered Date(s) Administered   Fluad Quad(high Dose 65+) 03/24/2019   Influenza Split 02/22/2011, 02/14/2012   Influenza,inj,Quad PF,6+ Mos 03/05/2013, 03/09/2014, 03/09/2015, 01/23/2017, 03/06/2018   Moderna Sars-Covid-2 Vaccination 07/14/2019, 08/11/2019, 06/03/2020   PPD Test 01/01/2017   Pneumococcal Conjugate-13 07/13/2014   Pneumococcal Polysaccharide-23 09/12/2010   Td 09/17/2008    Tdap: received today at visit   Flu Vaccine status: Due, Education has been provided regarding the importance of this vaccine. Advised may receive this vaccine at local pharmacy or Health Dept. Aware to provide a copy of the vaccination record if obtained from local pharmacy or Health Dept. Verbalized acceptance and understanding.  Pneumococcal vaccine status:  Up to date  Covid-19 vaccine status: Completed vaccines  Qualifies for Shingles Vaccine? Yes   Zostavax completed No   Shingrix Completed?: No.    Education has been provided regarding the importance of this vaccine. Patient has been advised to call insurance company to determine out of pocket expense if they have not yet received this vaccine. Advised may also receive vaccine at local pharmacy or Health Dept. Verbalized acceptance and understanding.  Screening Tests Health Maintenance  Topic Date Due   Zoster Vaccines- Shingrix (1 of 2) Never done   TETANUS/TDAP  09/18/2018   COVID-19 Vaccine (4 - Booster for Moderna series) 10/01/2020   INFLUENZA VACCINE  12/20/2020   DEXA SCAN  Completed   PNA vac Low Risk Adult  Completed   HPV VACCINES  Aged Out    Health Maintenance  Health Maintenance Due  Topic Date Due   Zoster Vaccines- Shingrix (1 of 2) Never done   TETANUS/TDAP  09/18/2018   COVID-19 Vaccine (4 - Booster for Moderna series) 10/01/2020   INFLUENZA VACCINE  12/20/2020    Colorectal cancer screening: No longer required.   Mammogram status: No longer required due to age.    Lung Cancer Screening: (Low Dose CT Chest recommended if Age 61-80 years, 30 pack-year currently smoking OR have quit w/in 15years.) does not qualify.     Additional Screening:  Hepatitis C Screening: does qualify; Completed.   Vision Screening: Recommended annual ophthalmology exams for early detection of glaucoma and other disorders of the eye. Is the patient up to date with their annual eye exam?  Yes   Who is the provider or what is the name of the office in which the patient attends annual eye exams? Not seeing an eye Dr. Dayton Scrape as if not needed at this time.  If pt is  not established with a provider, would they like to be referred to a provider to establish care? No .   Dental Screening: Recommended annual dental exams for proper oral hygiene  Community Resource Referral / Chronic  Care Management: CRR required this visit?  No   CCM required this visit?  No      Plan:     I have personally reviewed and noted the following in the patient's chart:   Medical and social history Use of alcohol, tobacco or illicit drugs  Current medications and supplements including opioid prescriptions.  Functional ability and status Nutritional status Physical activity Advanced directives List of other physicians Hospitalizations, surgeries, and ER visits in previous 12 months Vitals Screenings to include cognitive, depression, and falls Referrals and appointments  In addition, I have reviewed and discussed with patient certain preventive protocols, quality metrics, and best practice recommendations. A written personalized care plan for preventive services as well as general preventive health recommendations were provided to patient.     Lonn Georgia, LPN   10/22/9474   Nurse Notes: AWV conducted face to face with pt consent to annual wellness visit. Pt daughter present in the office to assist with hearing and questions. Provider located on site. This visit took approx 45 min.

## 2020-12-23 ENCOUNTER — Other Ambulatory Visit: Payer: Self-pay | Admitting: Family Medicine

## 2020-12-23 ENCOUNTER — Ambulatory Visit: Payer: Medicare HMO | Admitting: Nurse Practitioner

## 2020-12-23 DIAGNOSIS — R059 Cough, unspecified: Secondary | ICD-10-CM

## 2020-12-27 ENCOUNTER — Telehealth: Payer: Self-pay

## 2020-12-27 NOTE — Telephone Encounter (Signed)
Spoke with pt daughter that she would need an appt. She will call to be a work in first thing tomorrow.

## 2020-12-27 NOTE — Telephone Encounter (Signed)
Patient daughter Kristy Jarvis called asking when will the medicine for her cough and anti-depression that was discuss on her annual wellness visit last week be called into her pharmacy.   [facility]

## 2020-12-28 ENCOUNTER — Other Ambulatory Visit: Payer: Self-pay

## 2020-12-28 ENCOUNTER — Ambulatory Visit (HOSPITAL_COMMUNITY)
Admission: RE | Admit: 2020-12-28 | Discharge: 2020-12-28 | Disposition: A | Discharge status: Home / Self Care | Payer: Medicare HMO | Source: Ambulatory Visit | Attending: Family Medicine | Admitting: Family Medicine

## 2020-12-28 DIAGNOSIS — K449 Diaphragmatic hernia without obstruction or gangrene: Secondary | ICD-10-CM | POA: Diagnosis not present

## 2020-12-28 DIAGNOSIS — R059 Cough, unspecified: Secondary | ICD-10-CM | POA: Diagnosis not present

## 2020-12-28 NOTE — Telephone Encounter (Signed)
Pt daughter informed to get cxr. I offered sooner appt with Posey Pronto or Pearline Cables, wanted to wait until next week with Dr. Moshe Cipro. Scheduled.

## 2021-01-04 ENCOUNTER — Other Ambulatory Visit: Payer: Self-pay

## 2021-01-04 ENCOUNTER — Ambulatory Visit (INDEPENDENT_AMBULATORY_CARE_PROVIDER_SITE_OTHER): Payer: Medicare HMO | Admitting: Family Medicine

## 2021-01-04 VITALS — BP 138/82 | HR 88 | Resp 14 | Ht 60.0 in | Wt 91.0 lb

## 2021-01-04 DIAGNOSIS — E785 Hyperlipidemia, unspecified: Secondary | ICD-10-CM

## 2021-01-04 DIAGNOSIS — G3184 Mild cognitive impairment, so stated: Secondary | ICD-10-CM

## 2021-01-04 DIAGNOSIS — F4321 Adjustment disorder with depressed mood: Secondary | ICD-10-CM | POA: Diagnosis not present

## 2021-01-04 DIAGNOSIS — N1831 Chronic kidney disease, stage 3a: Secondary | ICD-10-CM | POA: Diagnosis not present

## 2021-01-04 DIAGNOSIS — I10 Essential (primary) hypertension: Secondary | ICD-10-CM | POA: Diagnosis not present

## 2021-01-04 DIAGNOSIS — R636 Underweight: Secondary | ICD-10-CM

## 2021-01-04 MED ORDER — CETIRIZINE HCL 5 MG/5ML PO SOLN: ORAL | 3 refills | Status: DC

## 2021-01-04 NOTE — Patient Instructions (Signed)
F/U as before, call if you need me sooner  [facility] to see if counseling/ assistance for elderly care/ support for caregiver and also for the patient  New is zyrtec syrup at bedtime , one teaspoon  Careful, no falls,  keep this up  Thanks for choosing [facility], we consider it a privelige to serve you.

## 2021-01-05 ENCOUNTER — Other Ambulatory Visit: Payer: Self-pay | Admitting: Family Medicine

## 2021-01-05 ENCOUNTER — Encounter: Payer: Self-pay | Admitting: Family Medicine

## 2021-01-05 DIAGNOSIS — F4321 Adjustment disorder with depressed mood: Secondary | ICD-10-CM | POA: Insufficient documentation

## 2021-01-05 NOTE — Progress Notes (Signed)
   Kristy Jarvis     MRN: [id number]      DOB: 12-18-1920   HPI [name] is here for follow up and re-evaluation of chronic medical conditions, medication management and review of any available recent lab and radiology data.  Daughter is concerned that she is becoming withdrawn and not very active, triggered by recently being moved in to live with her daughter as well as recent loss of a sibling States sleeping and lying in bed a lot, has to be pushed at times to bathe and to eat  ROS Denies recent fever or chills. Denies sinus pressure, nasal congestion, ear pain or sore throat. Denies chest congestion, productive cough or wheezing. Denies chest pains, palpitations and leg swelling Denies abdominal pain, nausea, vomiting,diarrhea or constipation.   Denies dysuria, frequency, hesitancy or incontinence. Chronic  joint pain,  and limitation in mobility. Denies headaches, seizures, numbness, or tingling. Recent CXR reviewed, lungs are clear, main abn is large hiatal hernia . Denies skin break down or rash.   PE  BP 138/82   Pulse 88   Resp 14   Ht 5' (1.524 m)   Wt 91 lb (41.3 kg)   SpO2 94%   BMI 17.77 kg/m    Patient alert and oriented and in no cardiopulmonary distress.  HEENT: No facial asymmetry, EOMI,     Neck decreased ROM  Chest: Clear to auscultation bilaterally.  CVS: S1, S2 no murmurs, no S3.Regular rate.  ABD: Soft non tender.   Ext: No edema  MS: Adequate though reduced  ROM spine, shoulders, hips and knees.  Skin: Intact, no ulcerations or rash noted.  Psych: Good eye contact, mildly depressed mood/ affect.  not anxious  appearing.  CNS: CN 2-12 intact, power,  normal throughout.no focal deficits noted.   Assessment & Plan  HTN (hypertension) Controlled, no change in medication   Grief Loss of sibling approx 2 months ago, and  Also recently moved in with her daughter, somewhat depressed , crying and less engaged with daily activities.  We discussed this and she will attempt to increase activity. Recent trial of remeron had her oversedated, based on age will hold of any additional medication  Hyperlipemia Hyperlipidemia:Low fat diet discussed and encouraged.   Lipid Panel  Lab Results  Component Value Date   CHOL 155 09/22/2020   HDL 63 09/22/2020   LDLCALC 71 09/22/2020   LDLDIRECT 85 06/17/2012   TRIG 116 09/22/2020   CHOLHDL 2.5 09/22/2020       Mildly underweight adult Encouraged pt and her daughter to increase high calorie meals to encourage weight gain

## 2021-01-05 NOTE — Assessment & Plan Note (Signed)
Encouraged pt and her daughter to increase high calorie meals to encourage weight gain

## 2021-01-05 NOTE — Assessment & Plan Note (Signed)
Hyperlipidemia:Low fat diet discussed and encouraged.   Lipid Panel  Lab Results  Component Value Date   CHOL 155 09/22/2020   HDL 63 09/22/2020   LDLCALC 71 09/22/2020   LDLDIRECT 85 06/17/2012   TRIG 116 09/22/2020   CHOLHDL 2.5 09/22/2020

## 2021-01-05 NOTE — Assessment & Plan Note (Signed)
Controlled, no change in medication  

## 2021-01-05 NOTE — Assessment & Plan Note (Signed)
Loss of sibling approx 2 months ago, and  Also recently moved in with her daughter, somewhat depressed , crying and less engaged with daily activities. We discussed this and she will attempt to increase activity. Recent trial of remeron had her oversedated, based on age will hold of any additional medication

## 2021-01-07 ENCOUNTER — Telehealth: Payer: Self-pay | Admitting: *Deleted

## 2021-01-07 NOTE — Chronic Care Management (AMB) (Signed)
  Chronic Care Management   Note  01/07/2021 Name: Kristy Jarvis MRN: [id number] DOB: 1921-01-18  Kristy Jarvis is a 87 y.o. year old female who is a primary care patient of Moshe Cipro [place] Levo, MD. I reached out to [name] by phone today in response to a referral sent by Ms. Yvetta Coder Masci's PCP, Fayrene Helper, MD      Kristy Jarvis was given information about Chronic Care Management services today including:  CCM service includes personalized support from designated clinical staff supervised by her physician, including individualized plan of care and coordination with other care providers 24/7 contact phone numbers for assistance for urgent and routine care needs. Service will only be billed when office clinical staff spend 20 minutes or more in a month to coordinate care. Only one practitioner may furnish and bill the service in a calendar month. The patient may stop CCM services at any time (effective at the end of the month) by phone call to the office staff. The patient will be responsible for cost sharing (co-pay) of up to 20% of the service fee (after annual deductible is met).  Patient agreed to services and verbal consent obtained.   Follow up plan: Telephone appointment with care management team member scheduled for:01/18/21  [place] [facility]  Direct Dial: [phone]

## 2021-01-14 DIAGNOSIS — B351 Tinea unguium: Secondary | ICD-10-CM | POA: Diagnosis not present

## 2021-01-14 DIAGNOSIS — L84 Corns and callosities: Secondary | ICD-10-CM | POA: Diagnosis not present

## 2021-01-14 DIAGNOSIS — N189 Chronic kidney disease, unspecified: Secondary | ICD-10-CM | POA: Diagnosis not present

## 2021-01-14 DIAGNOSIS — M2041 Other hammer toe(s) (acquired), right foot: Secondary | ICD-10-CM | POA: Diagnosis not present

## 2021-01-18 ENCOUNTER — Ambulatory Visit (INDEPENDENT_AMBULATORY_CARE_PROVIDER_SITE_OTHER): Payer: Medicare HMO | Admitting: *Deleted

## 2021-01-18 DIAGNOSIS — F4321 Adjustment disorder with depressed mood: Secondary | ICD-10-CM

## 2021-01-18 DIAGNOSIS — G3184 Mild cognitive impairment, so stated: Secondary | ICD-10-CM

## 2021-01-18 DIAGNOSIS — R636 Underweight: Secondary | ICD-10-CM

## 2021-01-18 NOTE — Patient Instructions (Signed)
Visit Information  PATIENT GOALS:  Goals Addressed             This Visit's Progress    Manage My Emotions and Needs       Timeframe:  Long-Range Goal Priority:  High Start Date:      01/18/21                       Expected End Date:    03/20/21                   Follow Up Date 02/21/21    - continue attending adult day center program - consider support group options for both patient and family - start or continue a personal journal with resources, information, etc -review material being mailed to you for possible care resources   Why is this important?   When you are stressed, down or upset, your body reacts too.  For example, your blood pressure may get higher; you may have a headache or stomachache.  When your emotions get the best of you, your body's ability to fight off cold and flu gets weak.  These steps will help you manage your emotions.     Notes:         The patient verbalized understanding of instructions, educational materials, and care plan provided today and declined offer to receive copy of patient instructions, educational materials, and care plan.   Telephone follow up appointment with care management team member scheduled for:02/21/21  Eduard Clos MSW, LCSW Licensed Clinical Social Education officer, environmental Primary Care [phone]

## 2021-01-18 NOTE — Chronic Care Management (AMB) (Signed)
Chronic Care Management    Clinical Social Work Note  01/18/2021 Name: Kristy Jarvis MRN: [id number] DOB: [birth year]  Kristy Jarvis is a 87 y.o. year old female who is a primary care patient of Moshe Cipro [place] Levo, MD. The CCM team was consulted to assist the patient with chronic disease management and/or care coordination needs related to: [employer]  and Caregiver Stress.   Engaged with patient by telephone for initial visit in response to provider referral for social work chronic care management and care coordination services.   Consent to Services:  The patient was given information about Chronic Care Management services, agreed to services, and gave verbal consent prior to initiation of services.  Please see initial visit note for detailed documentation.   Patient agreed to services and consent obtained.   Assessment: Review of patient past medical history, allergies, medications, and health status, including review of relevant consultants reports was performed today as part of a comprehensive evaluation and provision of chronic care management and care coordination services.     SDOH (Social Determinants of Health) assessments and interventions performed:    Advanced Directives Status: Not addressed in this encounter.  CCM Care Plan  Allergies  Allergen Reactions   Alendronate Sodium Other (See Comments)    Chest Pain, Fatigue    Penicillins Rash    Has patient had a PCN reaction causing immediate rash, facial/tongue/throat swelling, SOB or lightheadedness with hypotension: unknown Has patient had a PCN reaction causing severe rash involving mucus membranes or skin necrosis: unknown Has patient had a PCN reaction that required hospitalization no Has patient had a PCN reaction occurring within the last 10 years: no If all of the above answers are "NO", then may proceed with Ceph    Outpatient Encounter Medications as of 01/18/2021  Medication Sig    cetirizine HCl (ZYRTEC CHILDRENS ALLERGY) 5 MG/5ML SOLN One teaspoon at bedtime for allergies and cough   Cholecalciferol (VITAMIN D PO) Take 1 tablet by mouth daily.   Dextromethorphan-guaiFENesin (MUCINEX DM MAXIMUM STRENGTH) 60-1200 MG TB12 Take 1 tablet by mouth every 12 (twelve) hours as needed (congestion).   famotidine (PEPCID) 20 MG tablet Take 1 tablet (20 mg total) by mouth daily.   lisinopril (ZESTRIL) 5 MG tablet TAKE (1) TABLET BY MOUTH ONCE DAILY.   omeprazole (PRILOSEC) 20 MG capsule TAKE (1) CAPSULE BY MOUTH AT BEDTIME.   No facility-administered encounter medications on file as of 01/18/2021.    Patient Active Problem List   Diagnosis Date Noted   Grief 01/05/2021   Emphysema lung ([place]) 04/27/2020   Allergic sinusitis 01/26/2020   Mildly underweight adult 01/04/2020   HTN (hypertension) 12/17/2016   Light headedness 11/24/2015   At high risk for falls 07/05/2015   CKD (chronic kidney disease) stage 3, GFR 30-59 ml/min (HCC) 05/24/2015   Right kidney stone 02/22/2015   Hyperglycemia 03/25/2013   MCI (mild cognitive impairment) 10/23/2012   Hyperlipemia 08/13/2007   GERD 08/13/2007   Osteoporosis 08/13/2007   INTERMITTENT VERTIGO 08/13/2007    Conditions to be addressed/monitored: Dementia; Level of care concerns, Social Isolation, and Lacks knowledge of community resource:    Care Plan : LCSW Plan of Care  Updates made by Deirdre Peer, LCSW since 01/18/2021 12:00 AM     Problem: Psychosocial Adjustment to Dementia      Long-Range Goal: To provide support and resources to aide in patient and family's coping and care of patient with dementia   Start Date: 01/18/2021  Expected End Date: 03/21/2021  This Visit's Progress: On track  Priority: High  Note:   Current barriers:    Level of care concerns, ADL IADL limitations, Social Isolation, Cognitive Deficits, Memory Deficits, and Lacks knowledge of community resource:   Clinical Goals: Patient will work with  CSW to address needs related to planning and preparing for long term care needs/support Clinical Interventions:  CSW spoke with pt's daughter/primary caregiver who indicates pt is doing better. "She is eating better, more active and seems to be adjusting to the move".  Pt was living alone and moved in with her daughter 12/03/20. Per daughter, pt goes to the local adult day center [facility]" 3 days a week and has been attending regularly recently. Daughter has a husband and pt's granddaughter (lives nextdoor) who assist her with providing 24 hr supervision as needed in home.  Daughter is also awareof resources and support available both to pt and to Merchant navy officer through the [employer], [place] is a widow of a Veteran who was in in [place] alerted daughter to a program that she may be eligible through the V.A. and will have Care Guide mail info on this as well as other resources.  Daughter denies any current concerns or issues and is provided with # to call CSW if questions or needs arise before follow up call scheduled for 35month.  1:1 collaboration with primary care provider regarding development and update of comprehensive plan of care as evidenced by provider attestation and co-signature Inter-disciplinary care team collaboration (see longitudinal plan of care) Assessment of needs, barriers , agencies contacted, as well as how impacting Review various resources, discussed options and provided patient information about  Referral to care guide (VA aide and attendants program and other Dementia resources and support  Solution-Focused Strategies, Active listening / Reflection utilized , Problem [place] , Motivational Interviewing, and Caregiver stress acknowledged  Patient Goals/Self-Care Activities: Over the next 30 days Continue with compliance of taking medication  I have place a referral to the care guide for community resources, they will follow up with you  -  continue attending adult day center program - consider support group options for both patient and family - start or continue a personal journal with resources, information, etc -review material being mailed to you for possible care resources         Follow Up Plan: Appointment scheduled for SW follow up with client by phone on: 02/21/21      Eduard Clos MSW, LCSW Licensed Clinical Psychiatrist Primary Care [phone]

## 2021-01-20 ENCOUNTER — Telehealth: Payer: Self-pay | Admitting: *Deleted

## 2021-01-20 NOTE — Telephone Encounter (Signed)
   Telephone encounter was:  Unsuccessful.  01/20/2021 Name: Kristy Jarvis MRN: [id number] DOB: 1920-11-06  Unsuccessful outbound call made today to assist with:  Caregiver Stress  Outreach Attempt:  1st Attempt  A HIPAA compliant voice message was left requesting a return call.  Instructed patient to call back at   Instructed patient to call back at [phone]  at their earliest convenience. .  [place], Care Management  [phone] [address] , [place] [address] Email : [email or web address]

## 2021-01-26 ENCOUNTER — Telehealth: Payer: Self-pay | Admitting: *Deleted

## 2021-01-26 ENCOUNTER — Other Ambulatory Visit: Payer: Self-pay | Admitting: Family Medicine

## 2021-01-26 NOTE — Telephone Encounter (Signed)
   Telephone encounter was:  Unsuccessful.  01/26/2021 Name: KAILAN CARMEN MRN: [id number] DOB: Apr 26, 1921  Unsuccessful outbound call made today to assist with:  Called both numbers provided by daughter and no response or way to leave message  Outreach Attempt:  3rd Attempt.  Referral closed unable to contact patient.  Called both numbers provided by daughter and no response or way to leave message    [place], Care Management  [phone] [address] , [place] [address] Email : [email or web address]

## 2021-02-21 ENCOUNTER — Ambulatory Visit: Payer: Medicare HMO | Admitting: *Deleted

## 2021-02-21 DIAGNOSIS — F4321 Adjustment disorder with depressed mood: Secondary | ICD-10-CM

## 2021-02-21 DIAGNOSIS — N1831 Chronic kidney disease, stage 3a: Secondary | ICD-10-CM

## 2021-02-21 DIAGNOSIS — F419 Anxiety disorder, unspecified: Secondary | ICD-10-CM

## 2021-02-21 DIAGNOSIS — J439 Emphysema, unspecified: Secondary | ICD-10-CM

## 2021-02-21 DIAGNOSIS — G3184 Mild cognitive impairment, so stated: Secondary | ICD-10-CM

## 2021-02-21 NOTE — Patient Instructions (Signed)
Visit Information  PATIENT GOALS:  Goals Addressed   None     The patient verbalized understanding of instructions, educational materials, and care plan provided today and declined offer to receive copy of patient instructions, educational materials, and care plan.   No further follow up required:    Eduard Clos MSW, LCSW Licensed Clinical Psychiatrist Primary Care [phone]

## 2021-02-21 NOTE — Chronic Care Management (AMB) (Signed)
Chronic Care Management    Clinical Social Work Note  02/21/2021 Name: KATERIN NEGRETE MRN: [id number] DOB: 04-Dec-1920  DORTHULA BIER is a 87 y.o. year old female who is a primary care patient of Moshe Cipro Norwood Levo, MD. The CCM team was consulted to assist the patient with chronic disease management and/or care coordination needs related to: [employer] , Level of Care Concerns, and Caregiver Stress.   Engaged with patient by telephone for follow up visit in response to provider referral for social work chronic care management and care coordination services.   Consent to Services:  The patient was given information about Chronic Care Management services, agreed to services, and gave verbal consent prior to initiation of services.  Please see initial visit note for detailed documentation.   Patient agreed to services and consent obtained.   Assessment: Review of patient past medical history, allergies, medications, and health status, including review of relevant consultants reports was performed today as part of a comprehensive evaluation and provision of chronic care management and care coordination services.     SDOH (Social Determinants of Health) assessments and interventions performed:    Advanced Directives Status: Not addressed in this encounter.  CCM Care Plan  Allergies  Allergen Reactions   Alendronate Sodium Other (See Comments)    Chest Pain, Fatigue    Penicillins Rash    Has patient had a PCN reaction causing immediate rash, facial/tongue/throat swelling, SOB or lightheadedness with hypotension: unknown Has patient had a PCN reaction causing severe rash involving mucus membranes or skin necrosis: unknown Has patient had a PCN reaction that required hospitalization no Has patient had a PCN reaction occurring within the last 10 years: no If all of the above answers are "NO", then may proceed with Ce[facility]    Outpatient Encounter Medications as of 02/21/2021   Medication Sig   cetirizine HCl (ZYRTEC CHILDRENS ALLERGY) 5 MG/5ML SOLN One teaspoon at bedtime for allergies and cough   Cholecalciferol (VITAMIN D PO) Take 1 tablet by mouth daily.   Dextromethorphan-guaiFENesin (MUCINEX DM MAXIMUM STRENGTH) 60-1200 MG TB12 Take 1 tablet by mouth every 12 (twelve) hours as needed (congestion).   famotidine (PEPCID) 20 MG tablet Take 1 tablet (20 mg total) by mouth daily.   lisinopril (ZESTRIL) 5 MG tablet TAKE (1) TABLET BY MOUTH ONCE DAILY.   omeprazole (PRILOSEC) 20 MG capsule TAKE (1) CAPSULE BY MOUTH AT BEDTIME.   No facility-administered encounter medications on file as of 02/21/2021.    Patient Active Problem List   Diagnosis Date Noted   Grief 01/05/2021   Emphysema lung ([place]) 04/27/2020   Allergic sinusitis 01/26/2020   Mildly underweight adult 01/04/2020   HTN (hypertension) 12/17/2016   Light headedness 11/24/2015   At high risk for falls 07/05/2015   CKD (chronic kidney disease) stage 3, GFR 30-59 ml/min (HCC) 05/24/2015   Right kidney stone 02/22/2015   Hyperglycemia 03/25/2013   MCI (mild cognitive impairment) 10/23/2012   Hyperlipemia 08/13/2007   GERD 08/13/2007   Osteoporosis 08/13/2007   INTERMITTENT VERTIGO 08/13/2007    Conditions to be addressed/monitored: Anxiety and Dementia; Limited social support, Level of care concerns, Social Isolation, Limited access to caregiver, Memory Deficits, and Lacks knowledge of community resource:    Care Plan : LCSW Plan of Care  Updates made by Deirdre Peer, LCSW since 02/21/2021 12:00 AM     Problem: Psychosocial Adjustment to Dementia      Long-Range Goal: To provide support and resources to aide in  patient and family's coping and care of patient with dementia Completed 02/21/2021  Start Date: 01/18/2021  Expected End Date: 03/21/2021  This Visit's Progress: On track  Recent Progress: On track  Priority: High  Note:   Current barriers:    Level of care concerns, ADL IADL  limitations, Social Isolation, Cognitive Deficits, Memory Deficits, and Lacks knowledge of community resource:   Clinical Goals: Patient will work with CSW to address needs related to planning and preparing for long term care needs/support Clinical Interventions:  CSW spoke with pt's daughter/primary caregiver who indicates pt continues to have some intermittent grief and worry; daughter does not think RX is needed, stating they did that before and it "zonked her out".   Daughter reports receiving and reviewing Civil engineer, contracting and has no questions or further needs at this time.  "I don't think we want to pursue anything".   CSW encouraged daughter to outreach CSW if further needs, concerns or interests arise.   1:1 collaboration with primary care provider regarding development and update of comprehensive plan of care as evidenced by provider attestation and co-signature Inter-disciplinary care team collaboration (see longitudinal plan of care) Assessment of needs, barriers , agencies contacted, as well as how impacting Review various resources, discussed options and provided patient information about  Referral to care guide ([facility] aide and attendants program and other Dementia resources and support  Solution-Focused Strategies, Active listening / Reflection utilized , Problem [place], and Caregiver stress acknowledged  Patient Goals/Self-Care Activities: Over the next 30 days Continue with compliance of taking medication  - continue attending adult day center program - consider support group options for both patient and family - start or continue a personal journal with resources, information, etc -review material being mailed to you for possible care resources         Follow Up Plan: Client will outreach CSW if further needs arise      Eduard Clos MSW, LCSW Licensed Music therapist Primary Care [phone]

## 2021-02-22 ENCOUNTER — Ambulatory Visit: Payer: Medicare HMO | Admitting: Family Medicine

## 2021-02-23 ENCOUNTER — Other Ambulatory Visit: Payer: Self-pay | Admitting: Family Medicine

## 2021-03-01 ENCOUNTER — Ambulatory Visit (INDEPENDENT_AMBULATORY_CARE_PROVIDER_SITE_OTHER): Payer: Medicare HMO | Admitting: Family Medicine

## 2021-03-01 ENCOUNTER — Encounter: Payer: Self-pay | Admitting: Family Medicine

## 2021-03-01 ENCOUNTER — Other Ambulatory Visit: Payer: Self-pay

## 2021-03-01 DIAGNOSIS — Z9181 History of falling: Secondary | ICD-10-CM

## 2021-03-01 DIAGNOSIS — R053 Chronic cough: Secondary | ICD-10-CM

## 2021-03-01 MED ORDER — PREDNISONE 5 MG PO TABS: ORAL_TABLET | ORAL | 0 refills | Status: DC

## 2021-03-01 MED ORDER — BENZONATATE 100 MG PO CAPS: ORAL_CAPSULE | ORAL | 0 refills | Status: DC

## 2021-03-01 NOTE — Assessment & Plan Note (Signed)
2 month h/o cough, worse in the past 1 week, trial of tessalon perle, prednisone, , continue current  Refer pulmonary

## 2021-03-01 NOTE — Assessment & Plan Note (Signed)
safey and fall risk reduction discussed briefly, no recent falls

## 2021-03-01 NOTE — Progress Notes (Signed)
Virtual Visit via Telephone Note  I connected with Kristy Jarvis and her daughter Kristy Jarvis, on 03/01/21 at  8:20 AM EDT by telephone and verified that I am speaking with the correct person using two identifiers.  Location: Patient: home Provider: office   I discussed the limitations, risks, security and privacy concerns of performing an evaluation and management service by telephone and the availability of in person appointments. I also discussed with the patient that there may be a patient responsible charge related to this service. The patient expressed understanding and agreed to proceed.   History of Present Illness: 1 week  h/o excessive cough , worse than her lingering cough which she has had for over 2  months. Has used multiple OTC cough medications , no relief, day and night, no aggravating or relieving factors Denies fever, chills, change in appetite, may have a little less energy. Currently using daily claritin abd delsyn at bedtime    Observations/Objective: There were no vitals taken for this visit. Good communication with no confusion and intact memory. Alert and oriented x 3 No signs of respiratory distress during speech   Assessment and Plan: Chronic cough 2 month h/o cough, worse in the past 1 week, trial of tessalon perle, prednisone, , continue current  Refer pulmonary  At high risk for falls safey and fall risk reduction discussed briefly, no recent falls   Follow Up Instructions:    I discussed the assessment and treatment plan with the patient. The patient was provided an opportunity to ask questions and all were answered. The patient agreed with the plan and demonstrated an understanding of the instructions.   The patient was advised to call back or seek an in-person evaluation if the symptoms worsen or if the condition fails to improve as anticipated.  I provided 11 minutes of non-face-to-face time during this encounter.   Tula Nakayama, MD

## 2021-03-01 NOTE — Patient Instructions (Signed)
Follow-up as before call if you need me sooner.  New medication for your cough is prednisone 1 daily for 5 days and Tessalon Perles 1 daily for 5 days then as needed for excessive cough.  You are referred to the pulmonary specialist also.  Continue once daily Claritin for allergies and bedtime NyQuil DM as needed for excessive cough.  Thanks for choosing [facility], we consider it a privelige to serve you.

## 2021-03-02 ENCOUNTER — Telehealth: Payer: Medicare HMO | Admitting: Internal Medicine

## 2021-03-21 DIAGNOSIS — F4321 Adjustment disorder with depressed mood: Secondary | ICD-10-CM

## 2021-03-21 DIAGNOSIS — N1831 Chronic kidney disease, stage 3a: Secondary | ICD-10-CM

## 2021-03-21 DIAGNOSIS — J439 Emphysema, unspecified: Secondary | ICD-10-CM | POA: Diagnosis not present

## 2021-04-06 ENCOUNTER — Other Ambulatory Visit: Payer: Self-pay | Admitting: Family Medicine

## 2021-04-15 ENCOUNTER — Ambulatory Visit: Payer: Medicare HMO | Admitting: Internal Medicine

## 2021-04-15 ENCOUNTER — Encounter: Payer: Self-pay | Admitting: Internal Medicine

## 2021-04-15 ENCOUNTER — Other Ambulatory Visit: Payer: Self-pay

## 2021-04-15 DIAGNOSIS — R053 Chronic cough: Secondary | ICD-10-CM | POA: Diagnosis not present

## 2021-04-15 DIAGNOSIS — I10 Essential (primary) hypertension: Secondary | ICD-10-CM | POA: Diagnosis not present

## 2021-04-15 MED ORDER — METHYLPREDNISOLONE ACETATE 80 MG/ML IJ SUSP
80.0000 mg | Freq: Once | INTRAMUSCULAR | Status: AC
  Administered 2021-04-15: 80 mg via INTRAMUSCULAR

## 2021-04-15 MED ORDER — LOSARTAN POTASSIUM 50 MG PO TABS: 50.0000 mg | ORAL_TABLET | Freq: Every day | ORAL | 11 refills | Status: DC

## 2021-04-15 MED ORDER — PANTOPRAZOLE SODIUM 40 MG PO TBEC: 40.0000 mg | DELAYED_RELEASE_TABLET | Freq: Every day | ORAL | 2 refills | Status: DC

## 2021-04-15 NOTE — Progress Notes (Signed)
Kristy Jarvis, female    DOB: 17-Jul-1920   MRN: [id number]   Brief patient profile:  40 yowf never smoker referred to pulmonary clinic in [place]  04/15/2021 by Dr Moshe Cipro with cough x 20016 but worse since 2019.        History of Present Illness  04/15/2021  Pulmonary/ 1st office eval/ Derrel Moore / [place] [facility]  Chief Complaint  Patient presents with   Consult    Consistent cough and feeling like she has a lot of phlegm in her chest states has been going on for years    Dyspnea:  2 wheeled walker lives with daughter doing very little activity  Cough: mostly daytime dry starts when she wakes up but doesn't typically wake her up  Sleep: bed is flat/ one pillow   SABA use: not using, ? Some better p prednisone/ not better c zyrtec  Swallows ok   No obvious day to day or daytime variability or assoc excess/ purulent sputum or mucus plugs or hemoptysis or cp or chest tightness, subjective wheeze or overt sinus or hb symptoms.   Sleeping  without nocturnal    exacerbation  of respiratory  c/o's or need for noct saba. Also denies any obvious fluctuation of symptoms with weather or environmental changes or other aggravating or alleviating factors except as outlined above   No unusual exposure hx or h/o childhood pna/ asthma or knowledge of premature birth.  Current Allergies, Complete Past Medical History, Past Surgical History, Family History, and Social History were reviewed in [employer] record.  ROS  The following are not active complaints unless bolded Hoarseness, sore throat = globus  dysphagia, dental problems, itching, sneezing,  nasal congestion or discharge of excess mucus or purulent secretions, ear ache,   fever, chills, sweats, unintended wt loss or wt gain, classically pleuritic or exertional cp,  orthopnea pnd or arm/hand swelling  or leg swelling, presyncope, palpitations, abdominal pain, anorexia, nausea, vomiting, diarrhea  or change in bowel  habits or change in bladder habits, change in stools or change in urine, dysuria, hematuria,  rash, arthralgias, visual complaints, headache, numbness, weakness or ataxia or problems with walking or coordination,  change in mood or  memory.           Past Medical History:  Diagnosis Date   Cataract    Diverticulosis of colon    Noted by CT   GERD (gastroesophageal reflux disease)    Hyperlipidemia    Intermittent vertigo    Osteoporosis    Renal colic     Outpatient Medications Prior to Visit  - daughter does meds/ - NOTE:   Unable to verify as accurately reflecting what pt takes    Medication Sig Dispense Refill   Cholecalciferol (VITAMIN D PO) Take 1 tablet by mouth daily.     famotidine (PEPCID) 20 MG tablet TAKE 1 TABLET BY MOUTH ONCE DAILY. 90 tablet 0   lisinopril (ZESTRIL) 5 MG tablet TAKE (1) TABLET BY MOUTH ONCE DAILY. 90 tablet 0   omeprazole (PRILOSEC) 20 MG capsule TAKE (1) CAPSULE BY MOUTH AT BEDTIME. 90 capsule 0   benzonatate (TESSALON) 100 MG capsule Take one tablet by mouth once daily for 5 days, then as needed , for excess cough 20 capsule 0   cetirizine HCl (ZYRTEC CHILDRENS ALLERGY) 5 MG/5ML SOLN One teaspoon at bedtime for allergies and cough 150 mL 3   Dextromethorphan-guaiFENesin (MUCINEX DM MAXIMUM STRENGTH) 60-1200 MG TB12 Take 1 tablet by mouth every  12 (twelve) hours as needed (congestion). 28 tablet 0   predniSONE (DELTASONE) 5 MG tablet Take one tablet by mouth once daily for 5 days , for excess cough 5 tablet 0        Objective:     BP 116/88 (BP Location: Left Arm, Patient Position: Sitting)   Pulse 66   Temp 98.4 F (36.9 C) (Temporal)   Ht 5' (1.524 m)   Wt 91 lb 6.4 oz (41.5 kg)   SpO2 99% Comment: ra  BMI 17.85 kg/m   SpO2: 99 % (ra)  Elderly stoic wf / can't hear anything at all    HEENT : pt wearing mask not removed for exam due to covid -19 concerns.    NECK :  without JVD/Nodes/TM/ nl carotid upstrokes bilaterally   LUNGS:  no acc muscle use,  Nl contour chest which is clear to A and P bilaterally without cough on insp or exp maneuvers   CV:  RRR  no s3 or murmur or increase in P2, and no edema   ABD:  soft and nontender with nl inspiratory excursion in the supine position. No bruits or organomegaly appreciated, bowel sounds nl  MS:  Nl gait/ ext warm without deformities, calf tenderness, cyanosis or clubbing No obvious joint restrictions   SKIN: warm and dry without lesions    NEURO:  alert, approp, nl sensorium with  no motor or cerebellar deficits apparent.    I personally reviewed images and agree with radiology impression as follows:  CXR:   12/28/20  1. No radiographic evidence of acute cardiopulmonary disease. 2. Aortic atherosclerosis. 3. Large hiatal hernia.    Assessment   Chronic cough Onset ? With rx acei since 2018  - d/c acei  04/15/2021 and max rx for GERD  Upper airway cough syndrome (previously labeled PNDS),  is so named because it's frequently impossible to sort out how much is  CR/sinusitis with freq throat clearing (which can be related to primary GERD)   vs  causing  secondary (" extra esophageal")  GERD from wide swings in gastric pressure that occur with throat clearing, often  promoting self use of mint and menthol lozenges that reduce the lower esophageal sphincter tone and exacerbate the problem further in a cyclical fashion.   These are the same pts (now being labeled as having "irritable larynx syndrome" by some cough centers) who not infrequently have a history of having failed to tolerate ace inhibitors,  dry powder inhalers or biphosphonates or report having atypical/extraesophageal reflux symptoms like globus sensation  that don't respond to standard doses of PPI  and are easily confused as having aecopd or asthma flares by even experienced allergists/ pulmonologists (myself included).  >>> d/c acei and max rx for gerd x 6 weeks then return if not better to her  satisfaction    Essential hypertension D/c acei 04/15/2021 due to cough   In the best review of chronic cough to date ( NEJM 2016 375 3382-5053) ,  ACEi are now felt to cause cough in up to  20% of pts which is a 4 fold increase from previous reports and does not include the variety of non-specific complaints we see in pulmonary clinic in pts on ACEi but previously attributed to another dx like  Copd/asthma and  include PNDS, throat and chest congestion, "bronchitis", unexplained dyspnea and noct "strangling" sensations, and hoarseness, but also  atypical /refractory GERD symptoms like dysphagia,  globus and "bad heartburn"   The only  way I know  to prove this is not an "ACEi Case" is a trial off ACEi x a minimum of 6 weeks then regroup if not better - if improved can f/u with Dr Moshe Cipro       Each maintenance medication was reviewed in detail including emphasizing most importantly the difference between maintenance and prns and under what circumstances the prns are to be triggered using an action plan format where appropriate.  Total time for H and P, chart review, counseling, and generating customized AVS unique to this initial  office visit / same day charting > 45 min       Christinia Gully, MD 04/15/2021

## 2021-04-15 NOTE — Assessment & Plan Note (Signed)
Onset ? With rx acei since 2018  - d/c acei  04/15/2021 and max rx for GERD  Upper airway cough syndrome (previously labeled PNDS),  is so named because it's frequently impossible to sort out how much is  CR/sinusitis with freq throat clearing (which can be related to primary GERD)   vs  causing  secondary (" extra esophageal")  GERD from wide swings in gastric pressure that occur with throat clearing, often  promoting self use of mint and menthol lozenges that reduce the lower esophageal sphincter tone and exacerbate the problem further in a cyclical fashion.   These are the same pts (now being labeled as having "irritable larynx syndrome" by some cough centers) who not infrequently have a history of having failed to tolerate ace inhibitors,  dry powder inhalers or biphosphonates or report having atypical/extraesophageal reflux symptoms like globus sensation  that don't respond to standard doses of PPI  and are easily confused as having aecopd or asthma flares by even experienced allergists/ pulmonologists (myself included).  >>> d/c acei and max rx for gerd x 6 weeks then return if not better to her satisfaction

## 2021-04-15 NOTE — Assessment & Plan Note (Addendum)
D/c acei 04/15/2021 due to cough   In the best review of chronic cough to date ( NEJM 2016 375 7793-9688) ,  ACEi are now felt to cause cough in up to  20% of pts which is a 4 fold increase from previous reports and does not include the variety of non-specific complaints we see in pulmonary clinic in pts on ACEi but previously attributed to another dx like  Copd/asthma and  include PNDS, throat and chest congestion, "bronchitis", unexplained dyspnea and noct "strangling" sensations, and hoarseness, but also  atypical /refractory GERD symptoms like dysphagia,  globus and "bad heartburn"   The only way I know  to prove this is not an "ACEi Case" is a trial off ACEi x a minimum of 6 weeks then regroup if not better - if improved can f/u with Dr Moshe Cipro          Each maintenance medication was reviewed in detail including emphasizing most importantly the difference between maintenance and prns and under what circumstances the prns are to be triggered using an action plan format where appropriate.  Total time for H and P, chart review, counseling, and generating customized AVS unique to this initial  office visit / same day charting > 45 min

## 2021-04-15 NOTE — Patient Instructions (Addendum)
Stop lisinopril and replace it with losartan 50 mg one daily   Stop prilosec  Depomedrol 80 mg IM   Pantoprazole (protonix) 40 mg (prilosec 20 x 2)   Take  30-60 min before first meal of the day and Pepcid (famotidine)  20 mg after supper until return to office - this is the best way to tell whether stomach acid is contributing to your problem.    GERD (REFLUX)  is an extremely common cause of respiratory symptoms just like yours , many times with no obvious heartburn at all.    It can be treated with medication, but also with lifestyle changes including elevation of the head of your bed (ideally with 6 -8inch blocks under the headboard of your bed),  Smoking cessation, avoidance of late meals, excessive alcohol, and avoid fatty foods, chocolate, peppermint, colas, red wine, and acidic juices such as orange juice.  NO MINT OR MENTHOL PRODUCTS SO NO COUGH DROPS  USE SUGARLESS CANDY INSTEAD (Jolley ranchers or Stover's or Life Savers) or even ice chips will also do - the key is to swallow to prevent all throat clearing. NO OIL BASED VITAMINS - use powdered substitutes.  Avoid fish oil when coughing.   If NOT all better after the [place] please return.

## 2021-05-31 ENCOUNTER — Ambulatory Visit (INDEPENDENT_AMBULATORY_CARE_PROVIDER_SITE_OTHER): Payer: Medicare HMO | Admitting: Family Medicine

## 2021-05-31 ENCOUNTER — Other Ambulatory Visit: Payer: Self-pay

## 2021-05-31 ENCOUNTER — Encounter: Payer: Self-pay | Admitting: Family Medicine

## 2021-05-31 VITALS — BP 136/70 | HR 75 | Ht 60.0 in | Wt 94.0 lb

## 2021-05-31 DIAGNOSIS — K219 Gastro-esophageal reflux disease without esophagitis: Secondary | ICD-10-CM | POA: Diagnosis not present

## 2021-05-31 DIAGNOSIS — Z9181 History of falling: Secondary | ICD-10-CM

## 2021-05-31 DIAGNOSIS — R739 Hyperglycemia, unspecified: Secondary | ICD-10-CM | POA: Diagnosis not present

## 2021-05-31 DIAGNOSIS — J439 Emphysema, unspecified: Secondary | ICD-10-CM | POA: Diagnosis not present

## 2021-05-31 DIAGNOSIS — G3184 Mild cognitive impairment, so stated: Secondary | ICD-10-CM

## 2021-05-31 DIAGNOSIS — R7989 Other specified abnormal findings of blood chemistry: Secondary | ICD-10-CM | POA: Diagnosis not present

## 2021-05-31 DIAGNOSIS — I10 Essential (primary) hypertension: Secondary | ICD-10-CM | POA: Diagnosis not present

## 2021-05-31 DIAGNOSIS — Z23 Encounter for immunization: Secondary | ICD-10-CM

## 2021-05-31 NOTE — Assessment & Plan Note (Signed)
Controlled, no change in medication DASH diet and commitment to daily physical activity for a minimum of 30 minutes discussed and encouraged, as a part of hypertension management. The importance of attaining a healthy weight is also discussed.  BP/Weight 05/31/2021 04/15/2021 01/04/2021 12/22/2020 11/23/2020 11/04/2020 10/27/8833  Systolic BP 844 652 076 191 550 271 423  Diastolic BP 70 88 82 82 69 56 77  Wt. (Lbs) 94.04 91.4 91 90 90 84.4 -  BMI 18.37 17.85 17.77 16.46 17.58 16.48 -

## 2021-05-31 NOTE — Assessment & Plan Note (Signed)
Stable , [facility] and thoroughly enjoys this

## 2021-05-31 NOTE — Assessment & Plan Note (Signed)
Home safety reviewed,pt relies on and uses a walker all the time

## 2021-05-31 NOTE — Patient Instructions (Signed)
Annual exam in 6 months, call if you need me sooner  Flu vaccine today  Please get current covid booster at your pharmacy as soon as possible  Need shingrix vaccines, please check your pharmacy  Labs today, TSH, HBA1C , chem 7 and EGFr  Keep doing what you are m doing, and be careful not to fall  Best for 2023!

## 2021-05-31 NOTE — Assessment & Plan Note (Signed)
Improved control on double therapy and lifestyle modification as able

## 2021-05-31 NOTE — Assessment & Plan Note (Signed)
Updated lab needed at/ before next visit. Patient educated about the importance of limiting  Carbohydrate intake , the need to commit to daily physical activity for a minimum of 30 minutes , and to commit weight loss. The fact that changes in all these areas will reduce or eliminate all together the development of diabetes is stressed.   Diabetic Labs Latest Ref Rng & Units 11/04/2020 09/22/2020 03/19/2020 01/01/2020 03/28/2019  HbA1c 4.8 - 5.6 % - 6.0(H) - - -  Chol 100 - 199 mg/dL - 155 - 182 181  HDL >39 mg/dL - 63 - 75 68  Calc LDL 0 - 99 mg/dL - 71 - 87 89  Triglycerides 0 - 149 mg/dL - 116 - 115 142  Creatinine 0.57 - 1.00 mg/dL 1.59(H) 1.35(H) 1.54(H) 1.37(H) 1.27(H)   BP/Weight 05/31/2021 04/15/2021 01/04/2021 12/22/2020 11/23/2020 11/04/2020 11/10/2977  Systolic BP 892 119 417 408 144 818 563  Diastolic BP 70 88 82 82 69 56 77  Wt. (Lbs) 94.04 91.4 91 90 90 84.4 -  BMI 18.37 17.85 17.77 16.46 17.58 16.48 -   No flowsheet data found.

## 2021-05-31 NOTE — Progress Notes (Signed)
Kristy Jarvis     MRN: [id number]      DOB: 06-03-1920   HPI Kristy Jarvis is here for follow up and re-evaluation of chronic medical conditions, medication management and review of any available recent lab and radiology data.  Preventive health is updated, specifically   Immunization.   Questions or concerns regarding consultations or procedures which the PT has had in the interim are  addressed.Appreciative of pulmonary input, now understands cough is from reflux and aggressive mx of this is being carried out as able with pt co operation The PT denies any adverse reactions to current medications since the last visit.  Needs handicap sticker   ROS, history from pt and daughter Kristy Jarvis Denies recent fever or chills. Denies sinus pressure, nasal congestion, ear pain or sore throat. Denies chest congestion, productive cough or wheezing. Denies chest pains, palpitations and leg swelling Denies abdominal pain, nausea, vomiting,diarrhea or constipation.   Denies dysuria, frequency, hesitancy has minor incontinence. Denies joint pain, swelling and  does c/o limitation in mobility. Denies headaches, seizures, numbness, or tingling. Denies depression, anxiety or insomnia. Denies skin break down or rash.   PE  BP 136/70    Pulse 75    Ht 5' (1.524 m)    Wt 94 lb 0.6 oz (42.7 kg)    SpO2 95%    BMI 18.37 kg/m   Patient alert and oriented and in no cardiopulmonary distress.  HEENT: No facial asymmetry, EOMI,     Neck decreased ROM.  Chest: Clear to auscultation bilaterally.  CVS: S1, S2 no murmurs, no S3.Regular rate.  ABD: Soft non tender.   Ext: No edema  MS: decreased  ROM spine, shoulders, hips and knees.  Skin: Intact, no ulcerations or rash noted.  Psych: Good eye contact, normal affect.  not anxious or depressed appearing.  CNS: CN 2-12 intact, power,  normal throughout.no focal deficits noted.   Assessment & Plan  Essential hypertension Controlled, no change in  medication DASH diet and commitment to daily physical activity for a minimum of 30 minutes discussed and encouraged, as a part of hypertension management. The importance of attaining a healthy weight is also discussed.  BP/Weight 05/31/2021 04/15/2021 01/04/2021 12/22/2020 11/23/2020 11/04/2020 5/63/1497  Systolic BP 026 378 588 502 774 128 786  Diastolic BP 70 88 82 82 69 56 77  Wt. (Lbs) 94.04 91.4 91 90 90 84.4 -  BMI 18.37 17.85 17.77 16.46 17.58 16.48 -       At high risk for falls Home safety reviewed,pt relies on and uses a walker all the time  GERD Improved control on double therapy and lifestyle modification as able  Hyperlipemia Hyperlipidemia:Low fat diet discussed and encouraged.   Lipid Panel  Lab Results  Component Value Date   CHOL 155 09/22/2020   HDL 63 09/22/2020   LDLCALC 71 09/22/2020   LDLDIRECT 85 06/17/2012   TRIG 116 09/22/2020   CHOLHDL 2.5 09/22/2020  Controlled, no change in management       Hyperglycemia Updated lab needed at/ before next visit. Patient educated about the importance of limiting  Carbohydrate intake , the need to commit to daily physical activity for a minimum of 30 minutes , and to commit weight loss. The fact that changes in all these areas will reduce or eliminate all together the development of diabetes is stressed.   Diabetic Labs Latest Ref Rng & Units 11/04/2020 09/22/2020 03/19/2020 01/01/2020 03/28/2019  HbA1c 4.8 - 5.6 % - 6.0(H) - - -  Chol 100 - 199 mg/dL - 155 - 182 181  HDL >39 mg/dL - 63 - 75 68  Calc LDL 0 - 99 mg/dL - 71 - 87 89  Triglycerides 0 - 149 mg/dL - 116 - 115 142  Creatinine 0.57 - 1.00 mg/dL 1.59(H) 1.35(H) 1.54(H) 1.37(H) 1.27(H)   BP/Weight 05/31/2021 04/15/2021 01/04/2021 12/22/2020 11/23/2020 11/04/2020 7/98/1025  Systolic BP 486 282 417 530 104 045 913  Diastolic BP 70 88 82 82 69 56 77  Wt. (Lbs) 94.04 91.4 91 90 90 84.4 -  BMI 18.37 17.85 17.77 16.46 17.58 16.48 -   No flowsheet data found.

## 2021-05-31 NOTE — Assessment & Plan Note (Addendum)
Hyperlipidemia:Low fat diet discussed and encouraged.   Lipid Panel  Lab Results  Component Value Date   CHOL 155 09/22/2020   HDL 63 09/22/2020   LDLCALC 71 09/22/2020   LDLDIRECT 85 06/17/2012   TRIG 116 09/22/2020   CHOLHDL 2.5 09/22/2020  Controlled, no change in management

## 2021-05-31 NOTE — Assessment & Plan Note (Signed)
Updated lab needed at/ before next visit.   

## 2021-06-01 LAB — BMP8+EGFR
BUN/Creatinine Ratio: 13 (ref 12–28)
BUN: 21 mg/dL (ref 10–36)
CO2: 22 mmol/L (ref 20–29)
Calcium: 9.9 mg/dL (ref 8.7–10.3)
Chloride: 106 mmol/L (ref 96–106)
Creatinine, Ser: 1.57 mg/dL — ABNORMAL HIGH (ref 0.57–1.00)
Glucose: 110 mg/dL — ABNORMAL HIGH (ref 70–99)
Potassium: 4.5 mmol/L (ref 3.5–5.2)
Sodium: 147 mmol/L — ABNORMAL HIGH (ref 134–144)
eGFR: 30 mL/min/{1.73_m2} — ABNORMAL LOW (ref >59)

## 2021-06-01 LAB — HEMOGLOBIN A1C
Est. average glucose Bld gHb Est-mCnc: 123 mg/dL
Hgb A1c MFr Bld: 5.9 % — ABNORMAL HIGH (ref 4.8–5.6)

## 2021-06-01 LAB — TSH: TSH: 1.09 u[IU]/mL (ref 0.450–4.500)

## 2021-06-02 DIAGNOSIS — M2041 Other hammer toe(s) (acquired), right foot: Secondary | ICD-10-CM | POA: Diagnosis not present

## 2021-06-02 DIAGNOSIS — B351 Tinea unguium: Secondary | ICD-10-CM | POA: Diagnosis not present

## 2021-06-02 DIAGNOSIS — N189 Chronic kidney disease, unspecified: Secondary | ICD-10-CM | POA: Diagnosis not present

## 2021-06-02 DIAGNOSIS — L84 Corns and callosities: Secondary | ICD-10-CM | POA: Diagnosis not present

## 2021-06-22 ENCOUNTER — Other Ambulatory Visit: Payer: Self-pay | Admitting: Family Medicine

## 2021-07-13 ENCOUNTER — Other Ambulatory Visit: Payer: Self-pay | Admitting: Internal Medicine

## 2021-07-13 DIAGNOSIS — R053 Chronic cough: Secondary | ICD-10-CM

## 2021-09-29 ENCOUNTER — Telehealth: Payer: Self-pay

## 2021-09-29 NOTE — Telephone Encounter (Signed)
[facility] forms ? ?Copied ?Noted ?Sleeved ? ?Forms in brown folder  ?

## 2021-10-12 ENCOUNTER — Other Ambulatory Visit: Payer: Self-pay | Admitting: Internal Medicine

## 2021-10-12 ENCOUNTER — Other Ambulatory Visit: Payer: Self-pay | Admitting: Family Medicine

## 2021-10-12 ENCOUNTER — Ambulatory Visit (INDEPENDENT_AMBULATORY_CARE_PROVIDER_SITE_OTHER): Payer: Medicare HMO | Admitting: Family Medicine

## 2021-10-12 ENCOUNTER — Encounter: Payer: Self-pay | Admitting: Family Medicine

## 2021-10-12 VITALS — BP 135/75 | HR 72 | Resp 16 | Ht 60.0 in | Wt 90.0 lb

## 2021-10-12 DIAGNOSIS — Z9181 History of falling: Secondary | ICD-10-CM

## 2021-10-12 DIAGNOSIS — K219 Gastro-esophageal reflux disease without esophagitis: Secondary | ICD-10-CM | POA: Diagnosis not present

## 2021-10-12 DIAGNOSIS — N184 Chronic kidney disease, stage 4 (severe): Secondary | ICD-10-CM

## 2021-10-12 DIAGNOSIS — I10 Essential (primary) hypertension: Secondary | ICD-10-CM | POA: Diagnosis not present

## 2021-10-12 DIAGNOSIS — R636 Underweight: Secondary | ICD-10-CM | POA: Diagnosis not present

## 2021-10-12 DIAGNOSIS — R739 Hyperglycemia, unspecified: Secondary | ICD-10-CM | POA: Diagnosis not present

## 2021-10-12 DIAGNOSIS — E559 Vitamin D deficiency, unspecified: Secondary | ICD-10-CM | POA: Diagnosis not present

## 2021-10-12 DIAGNOSIS — R053 Chronic cough: Secondary | ICD-10-CM

## 2021-10-12 DIAGNOSIS — E785 Hyperlipidemia, unspecified: Secondary | ICD-10-CM

## 2021-10-12 NOTE — Patient Instructions (Addendum)
Annual exam in mid to end September, call if you need me sooner  Labs today, CBC, chem 7 and eGFr, HBA1C, and vit d  Be careful not to fall.  Eat often and eat regulalrly to keep your strength up  Thanks for choosing [facility], we consider it a privelige to serve you.

## 2021-10-13 LAB — CBC
Hematocrit: 43.1 % (ref 34.0–46.6)
Hemoglobin: 14.3 g/dL (ref 11.1–15.9)
MCH: 28.6 pg (ref 26.6–33.0)
MCHC: 33.2 g/dL (ref 31.5–35.7)
MCV: 86 fL (ref 79–97)
Platelets: 334 10*3/uL (ref 150–450)
RBC: 5 x10E6/uL (ref 3.77–5.28)
RDW: 12.8 % (ref 11.7–15.4)
WBC: 7.9 10*3/uL (ref 3.4–10.8)

## 2021-10-13 LAB — BMP8+EGFR
BUN/Creatinine Ratio: 13 (ref 12–28)
BUN: 26 mg/dL (ref 10–36)
CO2: 23 mmol/L (ref 20–29)
Calcium: 10.8 mg/dL — ABNORMAL HIGH (ref 8.7–10.3)
Chloride: 101 mmol/L (ref 96–106)
Creatinine, Ser: 1.95 mg/dL — ABNORMAL HIGH (ref 0.57–1.00)
Glucose: 111 mg/dL — ABNORMAL HIGH (ref 70–99)
Potassium: 5 mmol/L (ref 3.5–5.2)
Sodium: 141 mmol/L (ref 134–144)
eGFR: 23 mL/min/{1.73_m2} — ABNORMAL LOW (ref >59)

## 2021-10-13 LAB — HEMOGLOBIN A1C
Est. average glucose Bld gHb Est-mCnc: 128 mg/dL
Hgb A1c MFr Bld: 6.1 % — ABNORMAL HIGH (ref 4.8–5.6)

## 2021-10-13 LAB — VITAMIN D 25 HYDROXY (VIT D DEFICIENCY, FRACTURES): Vit D, 25-Hydroxy: 78.8 ng/mL (ref 30.0–100.0)

## 2021-10-17 ENCOUNTER — Encounter: Payer: Self-pay | Admitting: Family Medicine

## 2021-10-17 NOTE — Assessment & Plan Note (Signed)
Home safety discussed 

## 2021-10-17 NOTE — Assessment & Plan Note (Signed)
Controlled, no change in medication  

## 2021-10-17 NOTE — Assessment & Plan Note (Signed)
Deteriorated, increase plant based eating and control blood pressure

## 2021-10-17 NOTE — Progress Notes (Signed)
   Kristy Jarvis     MRN: [id number]      DOB: [birth year]   HPI Kristy Jarvis is here for follow up and re-evaluation of chronic medical conditions, medication management and review of any available recent lab and radiology data.  Preventive health is updated, specifically  Cancer screening and Immunization.   Daughter notes increased periods of weakness, less mobile and at times esp on the weekends when not at [facility] , tends to stay in bed ffor longer periods Appetite is not good ROS Denies recent fever or chills. Denies sinus pressure, nasal congestion, ear pain or sore throat. Denies chest congestion, productive cough or wheezing. Denies chest pains, palpitations and leg swelling Denies abdominal pain, nausea, vomiting,diarrhea or constipation.   Denies dysuria, frequency, hesitancy or incontinence. Denies uncontrolled  joint pain, swelling and limitation in mobility. Denies headaches, seizures, numbness, or tingling. Denies depression, anxiety or insomnia. Denies skin break down or rash.   PE  BP 135/75   Pulse 72   Resp 16   Ht 5' (1.524 m)   Wt 90 lb (40.8 kg)   SpO2 94%   BMI 17.58 kg/m   Patient alert and oriented and in no cardiopulmonary distress.  HEENT: No facial asymmetry, EOMI,     Neck supple .  Chest: Clear to auscultation bilaterally.  CVS: S1, S2 no murmurs, no S3.Regular rate.  ABD: Soft non tender.   Ext: No edema  MS: decreased ROM spine, shoulders, hips and knees.  Skin: Intact, no ulcerations or rash noted.  Psych: Good eye contact, normal affect. Memory imaired  not anxious or depressed appearing.  CNS: CN 2-12 intact,decreased hearing,  power,  normal throughout.no focal deficits noted.   Assessment & Plan  Essential hypertension Controlled, no change in medication   Mildly underweight adult Deteiroated, needs to increase caloric intake , more frequent high calorie meals  Hyperlipemia Hyperlipidemia:Low fat diet discussed  and encouraged.   Lipid Panel  Lab Results  Component Value Date   CHOL 155 09/22/2020   HDL 63 09/22/2020   LDLCALC 71 09/22/2020   LDLDIRECT 85 06/17/2012   TRIG 116 09/22/2020   CHOLHDL 2.5 09/22/2020   Updated lab needed at/ before next visit.     GERD Controlled, no change in medication   CKD (chronic kidney disease) stage 4, GFR 15-29 ml/min (HCC) Deteriorated, increase plant based eating and control blood pressure  At high risk for falls Home safety discussed

## 2021-10-17 NOTE — Assessment & Plan Note (Signed)
Deteiroated, needs to increase caloric intake , more frequent high calorie meals

## 2021-10-17 NOTE — Assessment & Plan Note (Signed)
Hyperlipidemia:Low fat diet discussed and encouraged.   Lipid Panel  Lab Results  Component Value Date   CHOL 155 09/22/2020   HDL 63 09/22/2020   LDLCALC 71 09/22/2020   LDLDIRECT 85 06/17/2012   TRIG 116 09/22/2020   CHOLHDL 2.5 09/22/2020   Updated lab needed at/ before next visit.

## 2021-11-29 ENCOUNTER — Encounter: Payer: Medicare HMO | Admitting: Family Medicine

## 2021-12-26 ENCOUNTER — Ambulatory Visit: Payer: Medicare HMO

## 2022-01-04 ENCOUNTER — Other Ambulatory Visit: Payer: Self-pay | Admitting: Internal Medicine

## 2022-01-04 DIAGNOSIS — R053 Chronic cough: Secondary | ICD-10-CM

## 2022-02-08 ENCOUNTER — Other Ambulatory Visit: Payer: Self-pay | Admitting: Internal Medicine

## 2022-02-08 DIAGNOSIS — R053 Chronic cough: Secondary | ICD-10-CM

## 2022-02-09 ENCOUNTER — Encounter: Payer: Medicare HMO | Admitting: Family Medicine

## 2022-02-15 ENCOUNTER — Other Ambulatory Visit: Payer: Self-pay

## 2022-02-15 DIAGNOSIS — R053 Chronic cough: Secondary | ICD-10-CM

## 2022-02-15 MED ORDER — PANTOPRAZOLE SODIUM 40 MG PO TBEC: DELAYED_RELEASE_TABLET | ORAL | 3 refills | Status: DC

## 2022-02-15 NOTE — Telephone Encounter (Signed)
Refill request from [facility]# [id number]

## 2022-02-20 ENCOUNTER — Ambulatory Visit (INDEPENDENT_AMBULATORY_CARE_PROVIDER_SITE_OTHER): Payer: Medicare HMO | Admitting: Internal Medicine

## 2022-02-20 ENCOUNTER — Encounter: Payer: Self-pay | Admitting: Internal Medicine

## 2022-02-20 DIAGNOSIS — Z0000 Encounter for general adult medical examination without abnormal findings: Secondary | ICD-10-CM | POA: Diagnosis not present

## 2022-02-20 NOTE — Patient Instructions (Addendum)
  Kristy Jarvis , Thank you for taking time to come for your Medicare Wellness Visit. I appreciate your ongoing commitment to your health goals. Please review the following plan we discussed and let me know if I can assist you in the future.   These are the goals we discussed:  Goals      DIET - REDUCE PORTION SIZE     Increase water intake     Recommend increasing water intake to 5-6 glasses a day.     Manage My Emotions and Needs     Timeframe:  Long-Range Goal Priority:  High Start Date:      01/18/21                       Expected End Date:    03/20/21                   Follow Up Date 02/21/21    - continue attending adult day center program - consider support group options for both patient and family - start or continue a personal journal with resources, information, etc -review material being mailed to you for possible care resources   Why is this important?   When you are stressed, down or upset, your body reacts too.  For example, your blood pressure may get higher; you may have a headache or stomachache.  When your emotions get the best of you, your body's ability to fight off cold and flu gets weak.  These steps will help you manage your emotions.     Notes:      Prevent falls        This is a list of the screening recommended for you and due dates:  Health Maintenance  Topic Date Due   Zoster (Shingles) Vaccine (1 of 2) Never done   Tetanus Vaccine  09/18/2018   COVID-19 Vaccine (4 - Moderna series) 07/29/2020   Flu Shot  12/20/2021   Pneumonia Vaccine  Completed   DEXA scan (bone density measurement)  Completed   HPV Vaccine  Aged Out

## 2022-02-20 NOTE — Progress Notes (Signed)
Subjective:  I connected with  Kristy Jarvis on 02/20/22 by a video enabled telemedicine application and verified that I am speaking with the correct person using two identifiers.   I discussed the limitations of evaluation and management by telemedicine. The patient expressed understanding and agreed to proceed.    Kristy Jarvis is a 88 y.o. female who presents for Medicare Annual (Subsequent) preventive examination. Patients daughter and Caregiver Kristy Jarvis) present for telephone encounter.   Review of Systems    Review of Systems  All other systems reviewed and are negative.     Objective:    There were no vitals filed for this visit. There is no height or weight on file to calculate BMI.     12/22/2020    1:36 PM 09/06/2020    7:22 AM 12/22/2019    2:42 PM 11/12/2017    9:01 AM 12/17/2016    4:00 PM 12/17/2016   11:11 AM 07/20/2016    3:39 PM  Advanced Directives  Does Patient Have a Medical Advance Directive? Yes Yes Yes Yes Yes Yes Yes  Type of Advance Directive Living will [place];Living will  [place];Living will Living will;Healthcare Power of Attorney Living will;Healthcare Power of [place];Living will  Does patient want to make changes to medical advance directive? No - Patient declined  No - Patient declined No - Patient declined No - Patient declined  No - Patient declined  Copy of [place] in Chart?  No - copy requested  Yes No - copy requested  Yes  Would patient like information on creating a medical advance directive? No - Patient declined          Current Medications (verified) Outpatient Encounter Medications as of 02/20/2022  Medication Sig   Cholecalciferol (VITAMIN D PO) Take 1 tablet by mouth daily.   famotidine (PEPCID) 20 MG tablet TAKE 1 TABLET BY MOUTH ONCE DAILY.   losartan (COZAAR) 50 MG tablet Take 1 tablet (50 mg total) by mouth daily.   pantoprazole  (PROTONIX) 40 MG tablet TAKE (1) TABLET BY MOUTH ONCE DAILY. 30-60 MINUTES BEFORE FIRST MEAL   No facility-administered encounter medications on file as of 02/20/2022.    Allergies (verified) Alendronate sodium and Penicillins   History: Past Medical History:  Diagnosis Date   Cataract    Diverticulosis of colon    Noted by CT   GERD (gastroesophageal reflux disease)    Hyperlipidemia    Intermittent vertigo    Osteoporosis    Renal colic    Past Surgical History:  Procedure Laterality Date   ABDOMINAL HYSTERECTOMY     BUNIONECTOMY  06/27/2011   left , Dr Kristy Jarvis   CATARACT EXTRACTION  2002   left eye    CHOLECYSTECTOMY  1989   ESOPHAGOGASTRODUODENOSCOPY  Jan  2007   prominent schatzki's ring, s/p 54F, small to moderate size hh   EYE SURGERY  2005 approx   cataract left   HIP SURGERY Left 12/20/2013   INTRAMEDULLARY (IM) NAIL INTERTROCHANTERIC Left 01/02/2014   Procedure: INTRAMEDULLARY (IM) NAIL INTERTROCHANTRIC;  Surgeon: Kristy Minion, MD;  Location: [facility];  Service: Orthopedics;  Laterality: Left;   Family History  Problem Relation Age of Onset   Heart attack Father    Diabetes Sister    Diabetes Sister    Diabetes Sister    Parkinson's disease Sister    Liver cancer Other  family history    Heart disease Brother    Prostate cancer Brother    Colon cancer Neg Hx    Anesthesia problems Neg Hx    Hypotension Neg Hx    Malignant hyperthermia Neg Hx    Pseudochol deficiency Neg Hx    Social History   Socioeconomic History   Marital status: Widowed    Spouse name: Not on file   Number of children: 1   Years of education: Not on file   Highest education level: Not on file  Occupational History   Occupation: retired   Tobacco Use   Smoking status: Never   Smokeless tobacco: Never  Vaping Use   Vaping Use: Never used  Substance and Sexual Activity   Alcohol use: No   Drug use: No   Sexual activity: Never  Other Topics Concern   Not on file  Social  History Narrative   GOES TO [place].    Social Determinants of Health   Financial Resource Strain: Low Risk  (12/22/2020)   Overall Financial Resource Strain (CARDIA)    Difficulty of Paying Living Expenses: Not hard at all  Food Insecurity: No Food Insecurity (12/22/2020)   Hunger Vital Sign    Worried About Running Out of Food in the Last Year: Never true    [place] in the Last Year: Never true  Transportation Needs: No Transportation Needs (12/22/2020)   PRAPARE - Hydrologist (Medical): No    Lack of Transportation (Non-Medical): No  Physical Activity: Inactive (12/22/2020)   Exercise Vital Sign    Days of Exercise per Week: 0 days    Minutes of Exercise per Session: 0 min  Stress: No Stress Concern Present (12/22/2020)   [place]    Feeling of Stress : Not at all  Social Connections: Socially Isolated (12/22/2020)   Social Connection and Isolation Panel [NHANES]    Frequency of Communication with Friends and Family: Never    Frequency of Social Gatherings with Friends and Family: Twice a week    Attends Religious Services: 1 to 4 times per year    Active Member of [employer] or Organizations: No    Attends Archivist Meetings: Never    Marital Status: Widowed    Tobacco Counseling Counseling given: Not Answered   Clinical Intake:  Pre-visit preparation completed: Yes  Pain : No/denies pain     Nutritional Risks: Unintentional weight loss Diabetes: No  How often do you need to have someone help you when you read instructions, pamphlets, or other written materials from your doctor or pharmacy?: 1 - Never What is the last grade level you completed in school?: 12th  Diabetic: No         Activities of Daily Living     No data to display          Patient Care Team: Kristy Helper, MD as PCP - General Rourk,  Kristy Estimable, MD (Gastroenterology) Kristy Jarvis, Kristy Furbish, MD as Consulting Physician (Urology) Kristy Jarvis, DPM as Attending Physician (Podiatry) Kristy Civil, MD as Consulting Physician (Orthopedic Surgery) Kristy Baptist, MD as Consulting Physician (Otolaryngology)  Indicate any recent Medical Services you may have received from other than [facility] providers in the past year (date may be approximate).     Assessment:   This is a routine wellness examination for [facility].  Hearing/Vision screen  No results found.  Dietary issues and exercise activities discussed:     Goals Addressed   None    Depression Screen    05/31/2021    8:13 AM 12/22/2020    1:37 PM 12/22/2020    1:31 PM 11/04/2020    9:19 AM 04/27/2020    1:36 PM 01/01/2020    1:16 PM 12/22/2019    2:43 PM  PHQ 2/9 Scores  PHQ - 2 Score 0 4 4 0 0 0 0  PHQ- 9 Score  13 13        Fall Risk    10/12/2021    2:56 PM 05/31/2021    8:13 AM 01/04/2021    8:36 AM 12/22/2020    1:37 PM 11/23/2020    1:47 PM  Fall Risk   Falls in the past year? 0 0 0 0 0  Number falls in past yr: 0 0 0 0 0  Injury with Fall? 0 0 0 0   Risk for fall due to :  No Fall Risks  No Fall Risks   Follow up  Falls evaluation completed  Falls evaluation completed Falls evaluation completed    [place]:  Any stairs in or around the home? No  If so, are there any without handrails? No  Home free of loose throw rugs in walkways, pet beds, electrical cords, etc? Yes  Adequate lighting in your home to reduce risk of falls? Yes   ASSISTIVE DEVICES UTILIZED TO PREVENT FALLS:  Life alert? No  Use of a cane, walker or w/c? Yes  Grab bars in the bathroom? No  Shower chair or bench in shower? No  Elevated toilet seat or a handicapped toilet? Yes    Cognitive Function:    01/04/2021    8:44 AM 07/20/2016    3:51 PM 07/13/2014    1:12 PM  MMSE - Mini Mental State Exam  Orientation to time '2 3 5  '$ Orientation to Place '5 5 5   '$ Registration '3 3 3  '$ Attention/ Calculation '4 5 5  '$ Recall '1 1 2  '$ Language- name 2 objects '2 2 2  '$ Language- repeat '1 1 1  '$ Language- follow 3 step command '3 3 3  '$ Language- read & follow direction '1 1 1  '$ Write a sentence '1 1 1  '$ Copy design '1 1 1  '$ Total score '24 26 29        '$ 12/22/2020    1:46 PM 12/22/2019    2:26 PM 11/19/2018   10:02 AM 11/12/2017    9:06 AM  6CIT Screen  What Year? 4 points 4 points 0 points 0 points  What month? 3 points 0 points 0 points 0 points  What time? 0 points 0 points 0 points 0 points  Count back from 20 2 points 0 points 0 points 0 points  Months in reverse  0 points 4 points 4 points  Repeat phrase 4 points 2 points 0 points 4 points  Total Score  6 points 4 points 8 points    Immunizations Immunization History  Administered Date(s) Administered   Fluad Quad(high Dose 65+) 03/24/2019, 05/31/2021   Influenza Split 02/22/2011, 02/14/2012   Influenza,inj,Quad PF,6+ Mos 03/05/2013, 03/09/2014, 03/09/2015, 01/23/2017, 03/06/2018   Moderna Sars-Covid-2 Vaccination 07/14/2019, 08/11/2019, 06/03/2020   PPD Test 01/01/2017   Pneumococcal Conjugate-13 07/13/2014   Pneumococcal Polysaccharide-23 09/12/2010   Td 09/17/2008    TDAP status: Due, Education has been provided regarding the importance of  this vaccine. Advised may receive this vaccine at local pharmacy or Health Dept. Aware to provide a copy of the vaccination record if obtained from local pharmacy or Health Dept. Verbalized acceptance and understanding.  Flu Vaccine status: Due, Education has been provided regarding the importance of this vaccine. Advised may receive this vaccine at local pharmacy or Health Dept. Aware to provide a copy of the vaccination record if obtained from local pharmacy or Health Dept. Verbalized acceptance and understanding.  Pneumococcal vaccine status: Up to date  Covid-19 vaccine status: Information provided on how to obtain vaccines.   Qualifies for Shingles  Vaccine? Yes   Zostavax completed No   Shingrix Completed?: Yes  Screening Tests Health Maintenance  Topic Date Due   Zoster Vaccines- Shingrix (1 of 2) Never done   TETANUS/TDAP  09/18/2018   COVID-19 Vaccine (4 - Moderna series) 07/29/2020   INFLUENZA VACCINE  12/20/2021   Pneumonia Vaccine 37+ Years old  Completed   DEXA SCAN  Completed   HPV VACCINES  Aged Out    Health Maintenance  Health Maintenance Due  Topic Date Due   Zoster Vaccines- Shingrix (1 of 2) Never done   TETANUS/TDAP  09/18/2018   COVID-19 Vaccine (4 - Moderna series) 07/29/2020   INFLUENZA VACCINE  12/20/2021    Colorectal cancer screening: No longer required.   Mammogram status: No longer required due to age.  Bone Density status: Completed 08/11/2013. Results reflect: Bone density results: OSTEOPOROSIS.   Lung Cancer Screening: (Low Dose CT Chest recommended if Age 72-80 years, 30 pack-year currently smoking OR have quit w/in 15years.) does not qualify.   Additional Screening:  Vision Screening: Recommended annual ophthalmology exams for early detection of glaucoma and other disorders of the eye. Is the patient up to date with their annual eye exam?  Has in the past. No problems at this time.  If pt is not established with a provider, would they like to be referred to a provider to establish care? No .   Dental Screening: Recommended annual dental exams for proper oral hygiene  Community Resource Referral / Chronic Care Management: CRR required this visit?  No   CCM required this visit?  No      Plan:     I have personally reviewed and noted the following in the patient's chart:   Medical and social history Use of alcohol, tobacco or illicit drugs  Current medications and supplements including opioid prescriptions. Patient is not currently taking opioid prescriptions. Functional ability and status Nutritional status Physical activity Advanced directives List of other  physicians Hospitalizations, surgeries, and ER visits in previous 12 months Vitals Screenings to include cognitive, depression, and falls Referrals and appointments  In addition, I have reviewed and discussed with patient certain preventive protocols, quality metrics, and best practice recommendations. A written personalized care plan for preventive services as well as general preventive health recommendations were provided to patient.     Lorene Dy, MD   02/20/2022

## 2022-03-02 ENCOUNTER — Encounter: Payer: Medicare HMO | Admitting: Family Medicine

## 2022-03-03 ENCOUNTER — Other Ambulatory Visit: Payer: Self-pay | Admitting: Family Medicine

## 2022-03-29 ENCOUNTER — Encounter: Payer: Self-pay | Admitting: Family Medicine

## 2022-03-29 ENCOUNTER — Ambulatory Visit (INDEPENDENT_AMBULATORY_CARE_PROVIDER_SITE_OTHER): Payer: Medicare HMO | Admitting: Family Medicine

## 2022-03-29 VITALS — BP 136/78 | HR 81 | Ht <= 58 in | Wt 92.0 lb

## 2022-03-29 DIAGNOSIS — R739 Hyperglycemia, unspecified: Secondary | ICD-10-CM | POA: Diagnosis not present

## 2022-03-29 DIAGNOSIS — I10 Essential (primary) hypertension: Secondary | ICD-10-CM

## 2022-03-29 DIAGNOSIS — Z23 Encounter for immunization: Secondary | ICD-10-CM | POA: Diagnosis not present

## 2022-03-29 DIAGNOSIS — Z0001 Encounter for general adult medical examination with abnormal findings: Secondary | ICD-10-CM

## 2022-03-29 NOTE — Assessment & Plan Note (Signed)
Annual exam as documented. Counseling done  re healthy lifestyle . Marland Kitchen Immunization needs are specifically addressed at this visit.

## 2022-03-29 NOTE — Patient Instructions (Signed)
Follow-up in 6 months call if you need me sooner.  Flu vaccine in office today.  Please get the COVID vaccine at your pharmacy.  Please get the RSV vaccine at your pharmacy.  Please get the Shingrix vaccines at your pharmacy.  HbA1c, Chem-7 and EGFR today.  Thankful that you are doing as well as you are.    No changes in your medications.    Continue to go to the [facility] since you enjoy this so much.  Happy Thanksgiving, Mary Christmas, happy 89th birthday on [birth year] and all the best for 2024!  Thanks for choosing [facility], we consider it a privelige to serve you.

## 2022-03-29 NOTE — Progress Notes (Signed)
    Kristy Jarvis     MRN: [id number]      DOB: 1920-06-25  HPI: Patient is in for annual physical exam. No other health concerns are expressed or addressed at the visit.  Immunization is reviewed , and  updated if needed.   PE: BP 136/78 (BP Location: Left Arm, Patient Position: Sitting, Cuff Size: Normal)   Pulse 81   Ht '4\' 10"'$  (1.473 m)   Wt 92 lb (41.7 kg)   SpO2 100%   BMI 19.23 kg/m   Pleasant  female, alert  in no cardio-pulmonary distress. Afebrile. HEENT No facial trauma or asymetry. Sinuses non tender.  Extra occullar muscles intact.. External ears normal, . Neck: decreased ROM, no adenopathy,JVD or thyromegaly.No bruits.  Chest: Clear to ascultation bilaterally.No crackles or wheezes. Non tender to palpation  Cardiovascular system; Heart sounds normal,  S1 and  S2 ,no S3.  No murmur, or thrill. l.  Abdomen: Soft, non tender  Musculoskeletal exam: Decreased though adequate  ROM of spine, hips , shoulders and knees.  Neurologic: Cranial nerves 2 to 12 intact.[place] and vision decreased  Power, tone ,sensation s normal throughout. Ambulates with a walker,  disturbance in gait. tremor. At rest  Skin: Intact, no ulceration, erythema , scaling or rash noted. Pigmentation normal throughout  Psych; Normal mood and affect.   Assessment & Plan:  Encounter for Medicare annual examination with abnormal findings Annual exam as documented. Counseling done  re healthy lifestyle . Marland Kitchen Immunization needs are specifically addressed at this visit.

## 2022-03-30 LAB — BMP8+EGFR
BUN/Creatinine Ratio: 15 (ref 12–28)
BUN: 24 mg/dL (ref 10–36)
CO2: 24 mmol/L (ref 20–29)
Calcium: 10.1 mg/dL (ref 8.7–10.3)
Chloride: 106 mmol/L (ref 96–106)
Creatinine, Ser: 1.59 mg/dL — ABNORMAL HIGH (ref 0.57–1.00)
Glucose: 103 mg/dL — ABNORMAL HIGH (ref 70–99)
Potassium: 5 mmol/L (ref 3.5–5.2)
Sodium: 145 mmol/L — ABNORMAL HIGH (ref 134–144)
eGFR: 29 mL/min/{1.73_m2} — ABNORMAL LOW (ref >59)

## 2022-03-30 LAB — HEMOGLOBIN A1C
Est. average glucose Bld gHb Est-mCnc: 120 mg/dL
Hgb A1c MFr Bld: 5.8 % — ABNORMAL HIGH (ref 4.8–5.6)

## 2022-04-06 ENCOUNTER — Other Ambulatory Visit: Payer: Self-pay | Admitting: Internal Medicine

## 2022-04-06 DIAGNOSIS — I10 Essential (primary) hypertension: Secondary | ICD-10-CM

## 2022-04-10 ENCOUNTER — Telehealth: Payer: Self-pay | Admitting: Family Medicine

## 2022-04-10 NOTE — Telephone Encounter (Signed)
Pt daughter called stating that Dr. Melvyn Novas originally prescribed her BP medication. States she only seen him once. Wants to know if Dr. Moshe Cipro can take over prescribing this medication?     losartan (COZAAR) 50 MG tablet    [facility]

## 2022-04-11 ENCOUNTER — Other Ambulatory Visit: Payer: Self-pay | Admitting: Internal Medicine

## 2022-04-11 MED ORDER — LOSARTAN POTASSIUM 50 MG PO TABS: 50.0000 mg | ORAL_TABLET | Freq: Every day | ORAL | 11 refills | Status: AC

## 2022-04-11 NOTE — Telephone Encounter (Signed)
I have prescribed the losartan, pls let her know, 1 year

## 2022-06-07 ENCOUNTER — Other Ambulatory Visit: Payer: Self-pay | Admitting: Internal Medicine

## 2022-06-07 DIAGNOSIS — R053 Chronic cough: Secondary | ICD-10-CM

## 2022-06-16 ENCOUNTER — Encounter: Payer: Self-pay | Admitting: Family Medicine

## 2022-07-13 ENCOUNTER — Other Ambulatory Visit: Payer: Self-pay | Admitting: Internal Medicine

## 2022-07-13 DIAGNOSIS — R053 Chronic cough: Secondary | ICD-10-CM

## 2022-07-18 ENCOUNTER — Other Ambulatory Visit: Payer: Self-pay | Admitting: Internal Medicine

## 2022-07-18 DIAGNOSIS — R053 Chronic cough: Secondary | ICD-10-CM

## 2022-07-20 ENCOUNTER — Other Ambulatory Visit: Payer: Self-pay

## 2022-07-20 ENCOUNTER — Other Ambulatory Visit: Payer: Self-pay | Admitting: Family Medicine

## 2022-07-20 ENCOUNTER — Telehealth: Payer: Self-pay | Admitting: Family Medicine

## 2022-07-20 DIAGNOSIS — R053 Chronic cough: Secondary | ICD-10-CM

## 2022-07-20 MED ORDER — PANTOPRAZOLE SODIUM 40 MG PO TBEC: DELAYED_RELEASE_TABLET | ORAL | 0 refills | Status: DC

## 2022-07-20 NOTE — Telephone Encounter (Signed)
Daughter aware medication sent

## 2022-07-20 NOTE — Telephone Encounter (Signed)
Pt daughter states Dr. Melvyn Novas has refused to refill her medication. She states that they are NOT going back to see him because she does not need to. Wants to know since Dr. Moshe Cipro knows her history, can she please start refilling this medication?  pantoprazole (PROTONIX) 40 MG tablet    [facility]    Please let pt daughter know when medication has been refilled

## 2022-08-03 ENCOUNTER — Telehealth: Payer: Self-pay | Admitting: Family Medicine

## 2022-08-03 NOTE — Telephone Encounter (Signed)
Amber, [place], [phone]    Wants to know if Dr. Moshe Cipro will be the attending for hospice services?

## 2022-08-03 NOTE — Telephone Encounter (Signed)
Amber with hospice is aware

## 2022-08-03 NOTE — Telephone Encounter (Signed)
I spoke with daughter and have clarification of her mother's current situation, have advised that she continue hospice eval, and I will support t and family through the process

## 2022-08-03 NOTE — Telephone Encounter (Signed)
Last ov in office was 04/18/22

## 2022-08-17 ENCOUNTER — Other Ambulatory Visit: Payer: Self-pay | Admitting: Family Medicine

## 2022-08-17 DIAGNOSIS — R053 Chronic cough: Secondary | ICD-10-CM

## 2022-08-31 ENCOUNTER — Telehealth: Payer: Self-pay | Admitting: Family Medicine

## 2022-08-31 ENCOUNTER — Other Ambulatory Visit: Payer: Self-pay

## 2022-08-31 DIAGNOSIS — R053 Chronic cough: Secondary | ICD-10-CM

## 2022-08-31 MED ORDER — PANTOPRAZOLE SODIUM 40 MG PO TBEC: DELAYED_RELEASE_TABLET | ORAL | 3 refills | Status: AC

## 2022-08-31 NOTE — Telephone Encounter (Signed)
Refills sent to pharmacy. 

## 2022-08-31 NOTE — Telephone Encounter (Signed)
New message   1. Which medications need to be refilled? (please list name of each medication and dose if known) pantoprazole (PROTONIX) 40 MG tablet   2. Which pharmacy/location (including street and city if local pharmacy) is medication to be sent to?[facility] - [place], [place] - [address]   3. Do they need a 30 day or 90 day supply? 15 day supply with 3 refills

## 2022-09-26 ENCOUNTER — Encounter (HOSPITAL_COMMUNITY): Payer: Self-pay | Admitting: Emergency Medicine

## 2022-09-26 ENCOUNTER — Observation Stay (HOSPITAL_COMMUNITY)
Admission: EM | Admit: 2022-09-26 | Discharge: 2022-09-27 | Disposition: A | Discharge status: Home / Self Care | Payer: Medicare Other | Attending: Internal Medicine | Admitting: Internal Medicine

## 2022-09-26 ENCOUNTER — Emergency Department (HOSPITAL_COMMUNITY): Payer: Medicare Other

## 2022-09-26 ENCOUNTER — Other Ambulatory Visit: Payer: Self-pay

## 2022-09-26 DIAGNOSIS — R109 Unspecified abdominal pain: Secondary | ICD-10-CM | POA: Diagnosis not present

## 2022-09-26 DIAGNOSIS — N184 Chronic kidney disease, stage 4 (severe): Secondary | ICD-10-CM | POA: Diagnosis not present

## 2022-09-26 DIAGNOSIS — N200 Calculus of kidney: Principal | ICD-10-CM

## 2022-09-26 DIAGNOSIS — K219 Gastro-esophageal reflux disease without esophagitis: Secondary | ICD-10-CM | POA: Diagnosis present

## 2022-09-26 DIAGNOSIS — I129 Hypertensive chronic kidney disease with stage 1 through stage 4 chronic kidney disease, or unspecified chronic kidney disease: Secondary | ICD-10-CM | POA: Insufficient documentation

## 2022-09-26 DIAGNOSIS — R112 Nausea with vomiting, unspecified: Secondary | ICD-10-CM | POA: Diagnosis present

## 2022-09-26 DIAGNOSIS — R739 Hyperglycemia, unspecified: Secondary | ICD-10-CM | POA: Diagnosis not present

## 2022-09-26 DIAGNOSIS — R7303 Prediabetes: Secondary | ICD-10-CM | POA: Insufficient documentation

## 2022-09-26 DIAGNOSIS — Z79899 Other long term (current) drug therapy: Secondary | ICD-10-CM | POA: Diagnosis not present

## 2022-09-26 DIAGNOSIS — I10 Essential (primary) hypertension: Secondary | ICD-10-CM | POA: Diagnosis present

## 2022-09-26 DIAGNOSIS — I160 Hypertensive urgency: Secondary | ICD-10-CM | POA: Insufficient documentation

## 2022-09-26 DIAGNOSIS — K5730 Diverticulosis of large intestine without perforation or abscess without bleeding: Secondary | ICD-10-CM | POA: Diagnosis not present

## 2022-09-26 DIAGNOSIS — N132 Hydronephrosis with renal and ureteral calculous obstruction: Secondary | ICD-10-CM | POA: Diagnosis not present

## 2022-09-26 LAB — COMPREHENSIVE METABOLIC PANEL
ALT: 13 U/L (ref 0–44)
AST: 27 U/L (ref 15–41)
Albumin: 4.1 g/dL (ref 3.5–5.0)
Alkaline Phosphatase: 96 U/L (ref 38–126)
Anion gap: 13 (ref 5–15)
BUN: 25 mg/dL — ABNORMAL HIGH (ref 8–23)
CO2: 24 mmol/L (ref 22–32)
Calcium: 9.6 mg/dL (ref 8.9–10.3)
Chloride: 102 mmol/L (ref 98–111)
Creatinine, Ser: 1.68 mg/dL — ABNORMAL HIGH (ref 0.44–1.00)
GFR, Estimated: 27 mL/min — ABNORMAL LOW (ref >60)
Glucose, Bld: 189 mg/dL — ABNORMAL HIGH (ref 70–99)
Potassium: 4.5 mmol/L (ref 3.5–5.1)
Sodium: 139 mmol/L (ref 135–145)
Total Bilirubin: 0.8 mg/dL (ref 0.3–1.2)
Total Protein: 7.8 g/dL (ref 6.5–8.1)

## 2022-09-26 LAB — URINALYSIS, ROUTINE W REFLEX MICROSCOPIC
Bilirubin Urine: NEGATIVE
Glucose, UA: NEGATIVE mg/dL
Ketones, ur: 5 mg/dL — AB
Leukocytes,Ua: NEGATIVE
Nitrite: NEGATIVE
Protein, ur: 30 mg/dL — AB
RBC / HPF: >50 RBC/hpf (ref 0–5)
Specific Gravity, Urine: 1.014 (ref 1.005–1.030)
pH: 6 (ref 5.0–8.0)

## 2022-09-26 LAB — TROPONIN I (HIGH SENSITIVITY)
Troponin I (High Sensitivity): 5 ng/L (ref <18)
Troponin I (High Sensitivity): 6 ng/L (ref <18)

## 2022-09-26 LAB — CBC
HCT: 43.8 % (ref 36.0–46.0)
Hemoglobin: 13.9 g/dL (ref 12.0–15.0)
MCH: 29.3 pg (ref 26.0–34.0)
MCHC: 31.7 g/dL (ref 30.0–36.0)
MCV: 92.4 fL (ref 80.0–100.0)
Platelets: 254 10*3/uL (ref 150–400)
RBC: 4.74 MIL/uL (ref 3.87–5.11)
RDW: 14 % (ref 11.5–15.5)
WBC: 10.1 10*3/uL (ref 4.0–10.5)
nRBC: 0 % (ref 0.0–0.2)

## 2022-09-26 LAB — MAGNESIUM: Magnesium: 2.1 mg/dL (ref 1.7–2.4)

## 2022-09-26 LAB — LIPASE, BLOOD: Lipase: 33 U/L (ref 11–51)

## 2022-09-26 MED ORDER — LACTATED RINGERS IV BOLUS
500.0000 mL | Freq: Once | INTRAVENOUS | Status: AC
  Administered 2022-09-26: 500 mL via INTRAVENOUS

## 2022-09-26 MED ORDER — FENTANYL CITRATE PF 50 MCG/ML IJ SOSY
25.0000 ug | PREFILLED_SYRINGE | Freq: Once | INTRAMUSCULAR | Status: AC
  Administered 2022-09-26: 25 ug via INTRAVENOUS
  Filled 2022-09-26: qty 1

## 2022-09-26 MED ORDER — ONDANSETRON HCL 4 MG/2ML IJ SOLN: 4.0000 mg | Freq: Once | INTRAMUSCULAR | Status: DC | PRN

## 2022-09-26 MED ORDER — TAMSULOSIN HCL 0.4 MG PO CAPS
0.4000 mg | ORAL_CAPSULE | Freq: Once | ORAL | Status: AC
  Administered 2022-09-26: 0.4 mg via ORAL
  Filled 2022-09-26: qty 1

## 2022-09-26 MED ORDER — ONDANSETRON HCL 4 MG/2ML IJ SOLN
4.0000 mg | Freq: Three times a day (TID) | INTRAMUSCULAR | Status: DC | PRN
  Administered 2022-09-26 – 2022-09-27 (×2): 4 mg via INTRAVENOUS
  Filled 2022-09-26 (×2): qty 2

## 2022-09-26 MED ORDER — CIPROFLOXACIN IN D5W 400 MG/200ML IV SOLN
400.0000 mg | Freq: Once | INTRAVENOUS | Status: AC
  Administered 2022-09-27: 400 mg via INTRAVENOUS
  Filled 2022-09-26: qty 200

## 2022-09-26 NOTE — ED Provider Notes (Signed)
[facility] AT [facility] Provider Note   CSN: [id number] Arrival date & time: 09/26/22  2007     History {Add pertinent medical, surgical, social history, OB history to HPI:1} Chief Complaint  Patient presents with   Emesis    Kristy Jarvis is a 89 y.o. female.   Emesis Associated symptoms: abdominal pain   Patient presents for abdominal pain, nausea, and vomiting.  Medical history includes HLD, GERD, osteoporosis, CKD, HTN, prior episode of nephrolithiasis.  She lives with her daughter.  At baseline, she ambulates with a walker.  During the day, she goes to what her daughter describes as an adult daycare.  Patient seem to be in her normal state of health this morning.  When she returned home from her daytime activities, she had onset of nausea and vomiting.  This was at approximately 3 PM this afternoon.  Since that time, she has been unable to tolerate any p.o. intake.  Prior to arrival in the ED, she was endorsing left lower quadrant abdominal pain.  Patient continues to endorse this left lower quadrant pain.  She denies any other areas of discomfort.  She denies current nausea.  Patient did have a small bowel movement while in the ED.  She is typically regular with her bowel movements.    Home Medications Prior to Admission medications   Medication Sig Start Date End Date Taking? Authorizing Provider  Cholecalciferol (VITAMIN D PO) Take 1 tablet by mouth daily.    [provider]  famotidine (PEPCID) 20 MG tablet TAKE 1 TABLET BY MOUTH ONCE DAILY. 07/20/22   Kerri Perches, MD  losartan (COZAAR) 50 MG tablet Take 1 tablet (50 mg total) by mouth daily. 04/11/22   Kerri Perches, MD  pantoprazole (PROTONIX) 40 MG tablet TAKE (1) TABLET BY MOUTH ONCE DAILY. 30-60 MINUTES BEFORE FIRST MEAL 08/31/22   Kerri Perches, MD      Allergies    Alendronate sodium and Penicillins    Review of Systems   Review of Systems   Gastrointestinal:  Positive for abdominal pain, nausea and vomiting.  All other systems reviewed and are negative.   Physical Exam Updated Vital Signs BP (!) 192/73   Pulse 64   Temp 98 F (36.7 C) (Oral)   Resp (!) 24   Ht 4\' 10"  (1.473 m)   Wt 40.8 kg   SpO2 98%   BMI 18.81 kg/m  Physical Exam Vitals and nursing note reviewed.  Constitutional:      General: She is not in acute distress.    Appearance: Normal appearance. She is well-developed. She is not ill-appearing, toxic-appearing or diaphoretic.  HENT:     Head: Normocephalic and atraumatic.     Right Ear: External ear normal.     Left Ear: External ear normal.     Nose: Nose normal.     Mouth/Throat:     Mouth: Mucous membranes are moist.  Eyes:     Extraocular Movements: Extraocular movements intact.     Conjunctiva/sclera: Conjunctivae normal.  Cardiovascular:     Rate and Rhythm: Normal rate and regular rhythm.  Pulmonary:     Effort: Pulmonary effort is normal. No respiratory distress.  Chest:     Chest wall: No tenderness.  Abdominal:     General: There is no distension.     Palpations: Abdomen is soft.     Tenderness: There is abdominal tenderness.  Musculoskeletal:  General: No swelling. Normal range of motion.     Cervical back: Normal range of motion and neck supple.     Right lower leg: No edema.     Left lower leg: No edema.  Skin:    General: Skin is warm and dry.     Capillary Refill: Capillary refill takes less than 2 seconds.     Coloration: Skin is not jaundiced or pale.  Neurological:     General: No focal deficit present.     Mental Status: She is alert and oriented to person, place, and time.     Cranial Nerves: No cranial nerve deficit.     Sensory: No sensory deficit.     Motor: No weakness.     Coordination: Coordination normal.  Psychiatric:        Mood and Affect: Mood normal.        Behavior: Behavior normal.        Thought Content: Thought content normal.         Judgment: Judgment normal.     ED Results / Procedures / Treatments   Labs (all labs ordered are listed, but only abnormal results are displayed) Labs Reviewed  LIPASE, BLOOD  COMPREHENSIVE METABOLIC PANEL  CBC  URINALYSIS, ROUTINE W REFLEX MICROSCOPIC    EKG None  Radiology No results found.  Procedures Procedures  {Document cardiac monitor, telemetry assessment procedure when appropriate:1}  Medications Ordered in ED Medications  ondansetron (ZOFRAN) injection 4 mg (has no administration in time range)    ED Course/ Medical Decision Making/ A&P   {   Click here for ABCD2, HEART and other calculatorsREFRESH Note before signing :1}                          Medical Decision Making Amount and/or Complexity of Data Reviewed Labs: ordered.  Risk Prescription drug management.   This patient presents to the ED for concern of ***, this involves an extensive number of treatment options, and is a complaint that carries with it a high risk of complications and morbidity.  The differential diagnosis includes ***   Co morbidities that complicate the patient evaluation  ***   Additional history obtained:  Additional history obtained from *** External records from outside source obtained and reviewed including ***   Lab Tests:  I Ordered, and personally interpreted labs.  The pertinent results include:  ***   Imaging Studies ordered:  I ordered imaging studies including ***  I independently visualized and interpreted imaging which showed *** I agree with the radiologist interpretation   Cardiac Monitoring: / EKG:  The patient was maintained on a cardiac monitor.  I personally viewed and interpreted the cardiac monitored which showed an underlying rhythm of: ***   Consultations Obtained:  I requested consultation with the ***,  and discussed lab and imaging findings as well as pertinent plan - they recommend: ***   Problem List / ED Course / Critical  interventions / Medication management  Patient presented for abdominal pain, nausea, and vomiting.  Onset was this afternoon.  She has been unable to tolerate p.o. intake since that time.  On arrival in the ED, vital signs are notable for hypertension.  Patient is alert and oriented on exam.  Abdomen does not appear distended.  It is soft to palpation.  She does have generalized tenderness.  Fentanyl was given for analgesia.  As needed Zofran was ordered for any recurrence of nausea.  Given her fluid losses, IV fluids were ordered.  Diagnostic workup was initiated.***. I ordered medication including ***  for ***  Reevaluation of the patient after these medicines showed that the patient {resolved/improved/worsened:23923::"improved"} I have reviewed the patients home medicines and have made adjustments as needed   Social Determinants of Health:  ***   Test / Admission - Considered:  ***   {Document critical care time when appropriate:1} {Document review of labs and clinical decision tools ie heart score, Chads2Vasc2 etc:1}  {Document your independent review of radiology images, and any outside records:1} {Document your discussion with family members, caretakers, and with consultants:1} {Document social determinants of health affecting pt's care:1} {Document your decision making why or why not admission, treatments were needed:1} Final Clinical Impression(s) / ED Diagnoses Final diagnoses:  None    Rx / DC Orders ED Discharge Orders     None

## 2022-09-26 NOTE — ED Triage Notes (Signed)
Pt via POV with family member reporting that pt has been vomiting all afternoon since 3pm, unknown # of episodes. Pt has reported LLQ pain which her daughter tried to treat at home but pt vomited up meds given PTA. Hx kidney stone with similar symptoms. Pt is hard of hearing and has dementia.

## 2022-09-26 NOTE — ED Notes (Signed)
Patient transported to CT 

## 2022-09-27 ENCOUNTER — Ambulatory Visit: Payer: Medicare HMO | Admitting: Family Medicine

## 2022-09-27 DIAGNOSIS — I10 Essential (primary) hypertension: Secondary | ICD-10-CM

## 2022-09-27 DIAGNOSIS — R7303 Prediabetes: Secondary | ICD-10-CM

## 2022-09-27 DIAGNOSIS — R1032 Left lower quadrant pain: Secondary | ICD-10-CM | POA: Diagnosis not present

## 2022-09-27 DIAGNOSIS — R112 Nausea with vomiting, unspecified: Secondary | ICD-10-CM | POA: Diagnosis not present

## 2022-09-27 DIAGNOSIS — R739 Hyperglycemia, unspecified: Secondary | ICD-10-CM | POA: Diagnosis not present

## 2022-09-27 DIAGNOSIS — I160 Hypertensive urgency: Secondary | ICD-10-CM | POA: Diagnosis not present

## 2022-09-27 DIAGNOSIS — K219 Gastro-esophageal reflux disease without esophagitis: Secondary | ICD-10-CM | POA: Diagnosis not present

## 2022-09-27 DIAGNOSIS — N200 Calculus of kidney: Secondary | ICD-10-CM | POA: Diagnosis not present

## 2022-09-27 LAB — CBC
HCT: 38.6 % (ref 36.0–46.0)
Hemoglobin: 12.1 g/dL (ref 12.0–15.0)
MCH: 29.3 pg (ref 26.0–34.0)
MCHC: 31.3 g/dL (ref 30.0–36.0)
MCV: 93.5 fL (ref 80.0–100.0)
Platelets: 205 10*3/uL (ref 150–400)
RBC: 4.13 MIL/uL (ref 3.87–5.11)
RDW: 14.2 % (ref 11.5–15.5)
WBC: 8.1 10*3/uL (ref 4.0–10.5)
nRBC: 0 % (ref 0.0–0.2)

## 2022-09-27 LAB — COMPREHENSIVE METABOLIC PANEL
ALT: 12 U/L (ref 0–44)
AST: 22 U/L (ref 15–41)
Albumin: 3.2 g/dL — ABNORMAL LOW (ref 3.5–5.0)
Alkaline Phosphatase: 75 U/L (ref 38–126)
Anion gap: 7 (ref 5–15)
BUN: 24 mg/dL — ABNORMAL HIGH (ref 8–23)
CO2: 27 mmol/L (ref 22–32)
Calcium: 9 mg/dL (ref 8.9–10.3)
Chloride: 103 mmol/L (ref 98–111)
Creatinine, Ser: 1.52 mg/dL — ABNORMAL HIGH (ref 0.44–1.00)
GFR, Estimated: 31 mL/min — ABNORMAL LOW (ref >60)
Glucose, Bld: 130 mg/dL — ABNORMAL HIGH (ref 70–99)
Potassium: 5.2 mmol/L — ABNORMAL HIGH (ref 3.5–5.1)
Sodium: 137 mmol/L (ref 135–145)
Total Bilirubin: 0.7 mg/dL (ref 0.3–1.2)
Total Protein: 6.2 g/dL — ABNORMAL LOW (ref 6.5–8.1)

## 2022-09-27 LAB — MAGNESIUM: Magnesium: 2 mg/dL (ref 1.7–2.4)

## 2022-09-27 LAB — PHOSPHORUS: Phosphorus: 3.2 mg/dL (ref 2.5–4.6)

## 2022-09-27 MED ORDER — PANTOPRAZOLE SODIUM 40 MG PO TBEC
40.0000 mg | DELAYED_RELEASE_TABLET | Freq: Every day | ORAL | Status: DC
  Administered 2022-09-27: 40 mg via ORAL
  Filled 2022-09-27: qty 1

## 2022-09-27 MED ORDER — ACETAMINOPHEN 325 MG PO TABS: 650.0000 mg | ORAL_TABLET | Freq: Four times a day (QID) | ORAL | Status: DC | PRN

## 2022-09-27 MED ORDER — SODIUM CHLORIDE 0.9 % IV SOLN: INTRAVENOUS | Status: AC

## 2022-09-27 MED ORDER — FENTANYL CITRATE PF 50 MCG/ML IJ SOSY: 12.5000 ug | PREFILLED_SYRINGE | INTRAMUSCULAR | Status: DC | PRN

## 2022-09-27 MED ORDER — FAMOTIDINE 20 MG PO TABS: 20.0000 mg | ORAL_TABLET | Freq: Every day | ORAL | Status: DC

## 2022-09-27 MED ORDER — LOSARTAN POTASSIUM 50 MG PO TABS
50.0000 mg | ORAL_TABLET | Freq: Every day | ORAL | Status: DC
  Administered 2022-09-27: 50 mg via ORAL
  Filled 2022-09-27: qty 1

## 2022-09-27 MED ORDER — HEPARIN SODIUM (PORCINE) 5000 UNIT/ML IJ SOLN
5000.0000 [IU] | Freq: Three times a day (TID) | INTRAMUSCULAR | Status: DC
  Administered 2022-09-27 (×2): 5000 [IU] via SUBCUTANEOUS
  Filled 2022-09-27 (×2): qty 1

## 2022-09-27 MED ORDER — ACETAMINOPHEN 650 MG RE SUPP: 650.0000 mg | Freq: Four times a day (QID) | RECTAL | Status: DC | PRN

## 2022-09-27 NOTE — Discharge Summary (Signed)
Physician Discharge Summary   Patient: Kristy Jarvis MRN: [id number] DOB: 1920/05/28  Admit date:     09/26/2022  Discharge date: 09/27/2022  Discharge Physician: Hollice Espy   PCP: Kerri Perches, MD   Recommendations at discharge:   Patient will follow-up with her PCP as needed in the next 1 to 2 months  Discharge Diagnoses: Principal Problem:   Nephrolithiasis Active Problems:   GERD (gastroesophageal reflux disease)   Nausea & vomiting   Hyperglycemia   Abdominal pain   Essential hypertension   Prediabetes   Hypertensive urgency  Resolved Problems:   * No resolved hospital problems. *  Hospital Course: 89 year old female with past medical history of hypertension and GERD who presented to the emergency room on the evening of 5/7 with abdominal pain, nausea and vomiting which had started that afternoon.  She had been in her usual state of health prior to that.  In the emergency room, noted to have elevated blood pressure in the 190s with otherwise normal labs.  Urinalysis noteworthy for some mild hematuria.  CT of abdomen and pelvis done noted a 3 mm calculus in the distal left ureter at the ureterovesicular junction with mild obstructive uropathy.  Patient started on empiric antibiotics plus Flomax plus IV fluids.  Assessment and Plan: Nausea/vomiting: By following day, had fully resolved.  Felt to be secondary to passing renal stone.  Left renal stone causing mild obstructive uropathy and referred abdominal pain: By following day, pain had passed.  Patient started on p.o. food and able to keep this down.  At this point, felt to be stable and discharged.  CKD stage IV: At baseline during entire hospitalization  Essential hypertension with hypertensive urgency: Increased blood pressures secondary to pain.  Resolved.  Patient resumed on normal blood pressure medications.        Consultants: None Procedures performed: None Disposition: Home Diet  recommendation:  Discharge Diet Orders (From admission, onward)     Start     Ordered   09/27/22 0000  Diet - low sodium heart healthy        09/27/22 1830           Heart healthy DISCHARGE MEDICATION: Allergies as of 09/27/2022       Reactions   Alendronate Sodium Other (See Comments)   Chest Pain, Fatigue    Penicillins Rash   Has patient had a PCN reaction causing immediate rash, facial/tongue/throat swelling, SOB or lightheadedness with hypotension: unknown Has patient had a PCN reaction causing severe rash involving mucus membranes or skin necrosis: unknown Has patient had a PCN reaction that required hospitalization no Has patient had a PCN reaction occurring within the last 10 years: no If all of the above answers are "NO", then may proceed with Ceph        Medication List     TAKE these medications    famotidine 20 MG tablet Commonly known as: PEPCID TAKE 1 TABLET BY MOUTH ONCE DAILY.   losartan 50 MG tablet Commonly known as: COZAAR Take 1 tablet (50 mg total) by mouth daily.   pantoprazole 40 MG tablet Commonly known as: PROTONIX TAKE (1) TABLET BY MOUTH ONCE DAILY. 30-60 MINUTES BEFORE FIRST MEAL   VITAMIN D PO Take 1 tablet by mouth daily.        Discharge Exam: Filed Weights   09/26/22 2015 09/27/22 0235  Weight: 40.8 kg 42.6 kg   General: Alert and oriented x 2, no acute distress Cardiovascular: Regular rate  and rhythm, S1-S2  Condition at discharge: good  The results of significant diagnostics from this hospitalization (including imaging, microbiology, ancillary and laboratory) are listed below for reference.   Imaging Studies: CT Renal Stone Study  Result Date: 09/26/2022 CLINICAL DATA:  Vomiting and left lower quadrant pain. EXAM: CT ABDOMEN AND PELVIS WITHOUT CONTRAST TECHNIQUE: Multidetector CT imaging of the abdomen and pelvis was performed following the standard protocol without IV contrast. RADIATION DOSE REDUCTION: This exam was  performed according to the departmental dose-optimization program which includes automated exposure control, adjustment of the mA and/or kV according to patient size and/or use of iterative reconstruction technique. COMPARISON:  CT renal stone 09/17/2017. FINDINGS: Lower chest: No acute abnormality. Hepatobiliary: No focal liver abnormality is seen. Status post cholecystectomy. No biliary dilatation. Pancreas: Unremarkable. No pancreatic ductal dilatation or surrounding inflammatory changes. Spleen: Normal in size without focal abnormality. Adrenals/Urinary Tract: There is a 3 mm calculus in the distal left ureter at the ureterovesicular junction. There is mild left-sided hydronephrosis. There is an additional punctate left renal calculus. Right renal calculi are also present measuring up to 6 mm. There is no right-sided hydronephrosis. The adrenal glands and bladder are within normal limits. Stomach/Bowel: Large hiatal hernia is again noted containing the proximal stomach. There is an air-fluid level within the stomach in the hernia sac. Appendix is not seen. No evidence of bowel wall thickening, distention, or inflammatory changes. Colonic diverticulosis is present. Vascular/Lymphatic: Aortic atherosclerosis. No enlarged abdominal or pelvic lymph nodes. Reproductive: Status post hysterectomy. No adnexal masses. Other: No abdominal wall hernia or abnormality. No abdominopelvic ascites. Musculoskeletal: Degenerative changes affect the spine. Left hip screw is present. IMPRESSION: 1. 3 mm calculus in the distal left ureter at the ureterovesicular junction with mild obstructive uropathy. 2. Additional nonobstructing bilateral renal calculi. 3. Large hiatal hernia. 4. Colonic diverticulosis. Aortic Atherosclerosis (ICD10-I70.0). Electronically Signed   By: Darliss Cheney M.D.   On: 09/26/2022 22:14    Microbiology: Results for orders placed or performed in visit on 11/04/20  Microscopic Examination     Status: None    Collection Time: 11/04/20  9:57 AM  Result Value Ref Range Status   WBC, UA 0-5 0 - 5 /hpf Final   RBC, Urine 0-2 0 - 2 /hpf Final   Epithelial Cells (non renal) 0-10 0 - 10 /hpf Final   Casts None seen None seen /lpf Final   Bacteria, UA None seen None seen/Few Final    Labs: CBC: Recent Labs  Lab 09/26/22 2055 09/27/22 0357  WBC 10.1 8.1  HGB 13.9 12.1  HCT 43.8 38.6  MCV 92.4 93.5  PLT 254 205   Basic Metabolic Panel: Recent Labs  Lab 09/26/22 2055 09/27/22 0357  NA 139 137  K 4.5 5.2*  CL 102 103  CO2 24 27  GLUCOSE 189* 130*  BUN 25* 24*  CREATININE 1.68* 1.52*  CALCIUM 9.6 9.0  MG 2.1 2.0  PHOS  --  3.2   Liver Function Tests: Recent Labs  Lab 09/26/22 2055 09/27/22 0357  AST 27 22  ALT 13 12  ALKPHOS 96 75  BILITOT 0.8 0.7  PROT 7.8 6.2*  ALBUMIN 4.1 3.2*   CBG: No results for input(s): "GLUCAP" in the last 168 hours.  Discharge time spent: less than 30 minutes.  Signed: Hollice Espy, MD [facility] 09/27/2022

## 2022-09-27 NOTE — Progress Notes (Signed)
   09/27/22 0222  Assess: MEWS Score  Temp (!) 97.5 F (36.4 C)  BP (!) 150/67  MAP (mmHg) 91  Pulse Rate (!) 58  Resp (!) 32  SpO2 100 %  O2 Device Room Air  Assess: MEWS Score  MEWS Temp 0  MEWS Systolic 0  MEWS Pulse 0  MEWS RR 2  MEWS LOC 0  MEWS Score 2  MEWS Score Color Yellow  Assess: if the MEWS score is Yellow or Red  Were vital signs taken at a resting state? Yes  Focused Assessment No change from prior assessment  Does the patient meet 2 or more of the SIRS criteria? No  MEWS guidelines implemented  Yes, yellow  Treat  MEWS Interventions Considered administering scheduled or prn medications/treatments as ordered  Take Vital Signs  Increase Vital Sign Frequency  Yellow: Q2hr x1, continue Q4hrs until patient remains green for 12hrs  Escalate  MEWS: Escalate Yellow: Discuss with charge nurse and consider notifying provider and/or RRT  Notify: Charge Nurse/RN  Name of Charge Nurse/RN Notified Rosalyn Charters  Provider Notification  Provider Name/Title Dr. Caswell Corwin  Date Provider Notified 09/27/22  Time Provider Notified 0235  Method of Notification Page  Notification Reason Change in status  Provider response No new orders  Assess: SIRS CRITERIA  SIRS Temperature  0  SIRS Pulse 0  SIRS Respirations  1  SIRS WBC 0  SIRS Score Sum  1

## 2022-09-27 NOTE — ED Notes (Signed)
ED TO INPATIENT HANDOFF REPORT  ED Nurse Name and Phone #: Morrie Sheldon [phone]  X Name/Age/Gender Kristy Jarvis 89 y.o. female Room/Bed: APA03/APA03  Code Status   Code Status: DNR  Home/SNF/Other Home Patient oriented to: self, place Is this baseline? Yes   Triage Complete: Triage complete  Chief Complaint Nephrolithiasis [N20.0]  Triage Note Pt via POV with family member reporting that pt has been vomiting all afternoon since 3pm, unknown # of episodes. Pt has reported LLQ pain which her daughter tried to treat at home but pt vomited up meds given PTA. Hx kidney stone with similar symptoms. Pt is hard of hearing and has dementia.    Allergies Allergies  Allergen Reactions   Alendronate Sodium Other (See Comments)    Chest Pain, Fatigue    Penicillins Rash    Has patient had a PCN reaction causing immediate rash, facial/tongue/throat swelling, SOB or lightheadedness with hypotension: unknown Has patient had a PCN reaction causing severe rash involving mucus membranes or skin necrosis: unknown Has patient had a PCN reaction that required hospitalization no Has patient had a PCN reaction occurring within the last 10 years: no If all of the above answers are "NO", then may proceed with Ceph    Level of Care/Admitting Diagnosis ED Disposition     ED Disposition  Admit   Condition  --   Comment  Hospital Area: [facility] [[id number]]  Level of Care: Med-Surg [16]  Covid Evaluation: Asymptomatic - no recent exposure (last 10 days) testing not required  Diagnosis: Nephrolithiasis [528413]  Admitting Physician: Frankey Shown [[id number]]  Attending Physician: Frankey Shown [[id number]]          B Medical/Surgery History Past Medical History:  Diagnosis Date   Cataract    Diverticulosis of colon    Noted by CT   GERD (gastroesophageal reflux disease)    Hyperlipidemia    Intermittent vertigo    Osteoporosis    Renal colic    Past Surgical History:   Procedure Laterality Date   ABDOMINAL HYSTERECTOMY     BUNIONECTOMY  06/27/2011   left , Dr Pricilla Holm   CATARACT EXTRACTION  2002   left eye    CHOLECYSTECTOMY  1989   ESOPHAGOGASTRODUODENOSCOPY  Jan  2007   prominent schatzki's ring, s/p 85F, small to moderate size hh   EYE SURGERY  2005 approx   cataract left   HIP SURGERY Left 12/20/2013   INTRAMEDULLARY (IM) NAIL INTERTROCHANTERIC Left 01/02/2014   Procedure: INTRAMEDULLARY (IM) NAIL INTERTROCHANTRIC;  Surgeon: Nadara Mustard, MD;  Location: [facility] OR;  Service: Orthopedics;  Laterality: Left;     A IV Location/Drains/Wounds Patient Lines/Drains/Airways Status     Active Line/Drains/Airways     Name Placement date Placement time Site Days   Peripheral IV 09/26/22 20 G 1" Anterior;Left;Proximal Forearm 09/26/22  2113  Forearm  1   Incision (Closed) 01/02/14 Hip Left 01/02/14  1706  -- 3190            Intake/Output Last 24 hours  Intake/Output Summary (Last 24 hours) at 09/27/2022 0131 Last data filed at 09/27/2022 0128 Gross per 24 hour  Intake 1000 ml  Output --  Net 1000 ml    Labs/Imaging Results for orders placed or performed during the hospital encounter of 09/26/22 (from the past 48 hour(s))  Lipase, blood     Status: None   Collection Time: 09/26/22  8:55 PM  Result Value Ref Range   Lipase 33 11 - 51 U/L  Comment: Performed at [facility], [address], [place], [place] [address]  Comprehensive metabolic panel     Status: Abnormal   Collection Time: 09/26/22  8:55 PM  Result Value Ref Range   Sodium 139 135 - 145 mmol/L   Potassium 4.5 3.5 - 5.1 mmol/L   Chloride 102 98 - 111 mmol/L   CO2 24 22 - 32 mmol/L   Glucose, Bld 189 (H) 70 - 99 mg/dL    Comment: Glucose reference range applies only to samples taken after fasting for at least 8 hours.   BUN 25 (H) 8 - 23 mg/dL   Creatinine, Ser 8.31 (H) 0.44 - 1.00 mg/dL   Calcium 9.6 8.9 - 51.7 mg/dL   Total Protein 7.8 6.5 - 8.1 g/dL   Albumin 4.1 3.5 - 5.0  g/dL   AST 27 15 - 41 U/L   ALT 13 0 - 44 U/L   Alkaline Phosphatase 96 38 - 126 U/L   Total Bilirubin 0.8 0.3 - 1.2 mg/dL   GFR, Estimated 27 (L) >60 mL/min    Comment: (NOTE) Calculated using the CKD-EPI Creatinine Equation (2021)    Anion gap 13 5 - 15    Comment: Performed at [facility], [address], [place], [place] [address]  CBC     Status: None   Collection Time: 09/26/22  8:55 PM  Result Value Ref Range   WBC 10.1 4.0 - 10.5 K/uL   RBC 4.74 3.87 - 5.11 MIL/uL   Hemoglobin 13.9 12.0 - 15.0 g/dL   HCT 37.1 06.2 - 69.4 %   MCV 92.4 80.0 - 100.0 fL   MCH 29.3 26.0 - 34.0 pg   MCHC 31.7 30.0 - 36.0 g/dL   RDW 85.4 62.7 - 03.5 %   Platelets 254 150 - 400 K/uL   nRBC 0.0 0.0 - 0.2 %    Comment: Performed at [facility], [address], [place], [place] [address]  Magnesium     Status: None   Collection Time: 09/26/22  8:55 PM  Result Value Ref Range   Magnesium 2.1 1.7 - 2.4 mg/dL    Comment: Performed at [facility], [address], [place], [place] [address]  Troponin I (High Sensitivity)     Status: None   Collection Time: 09/26/22  8:55 PM  Result Value Ref Range   Troponin I (High Sensitivity) 5 <18 ng/L    Comment: (NOTE) Elevated high sensitivity troponin I (hsTnI) values and significant  changes across serial measurements may suggest ACS but many other  chronic and acute conditions are known to elevate hsTnI results.  Refer to the "Links" section for chest pain algorithms and additional  guidance. Performed at [facility], [address], [place], [place] [address]   Troponin I (High Sensitivity)     Status: None   Collection Time: 09/26/22 10:48 PM  Result Value Ref Range   Troponin I (High Sensitivity) 6 <18 ng/L    Comment: (NOTE) Elevated high sensitivity troponin I (hsTnI) values and significant  changes across serial measurements may suggest ACS but many other  chronic and acute conditions are known to elevate hsTnI results.  Refer to the "Links"  section for chest pain algorithms and additional  guidance. Performed at [facility], [address] [place], [place] [address]   Urinalysis, Routine w reflex microscopic -Urine, Clean Catch     Status: Abnormal   Collection Time: 09/26/22 11:25 PM  Result Value Ref Range   Color, Urine  YELLOW YELLOW   APPearance HAZY (A) CLEAR   Specific Gravity, Urine 1.014 1.005 - 1.030   pH 6.0 5.0 - 8.0   Glucose, UA NEGATIVE NEGATIVE mg/dL   Hgb urine dipstick LARGE (A) NEGATIVE   Bilirubin Urine NEGATIVE NEGATIVE   Ketones, ur 5 (A) NEGATIVE mg/dL   Protein, ur 30 (A) NEGATIVE mg/dL   Nitrite NEGATIVE NEGATIVE   Leukocytes,Ua NEGATIVE NEGATIVE   RBC / HPF >50 0 - 5 RBC/hpf   WBC, UA 21-50 0 - 5 WBC/hpf   Bacteria, UA RARE (A) NONE SEEN   Squamous Epithelial / HPF 0-5 0 - 5 /HPF   WBC Clumps PRESENT     Comment: Performed at [facility], [address] [place], [place] [address]   CT Renal Stone Study  Result Date: 09/26/2022 CLINICAL DATA:  Vomiting and left lower quadrant pain. EXAM: CT ABDOMEN AND PELVIS WITHOUT CONTRAST TECHNIQUE: Multidetector CT imaging of the abdomen and pelvis was performed following the standard protocol without IV contrast. RADIATION DOSE REDUCTION: This exam was performed according to the departmental dose-optimization program which includes automated exposure control, adjustment of the mA and/or kV according to patient size and/or use of iterative reconstruction technique. COMPARISON:  CT renal stone 09/17/2017. FINDINGS: Lower chest: No acute abnormality. Hepatobiliary: No focal liver abnormality is seen. Status post cholecystectomy. No biliary dilatation. Pancreas: Unremarkable. No pancreatic ductal dilatation or surrounding inflammatory changes. Spleen: Normal in size without focal abnormality. Adrenals/Urinary Tract: There is a 3 mm calculus in the distal left ureter at the ureterovesicular junction. There is mild left-sided hydronephrosis. There is an additional  punctate left renal calculus. Right renal calculi are also present measuring up to 6 mm. There is no right-sided hydronephrosis. The adrenal glands and bladder are within normal limits. Stomach/Bowel: Large hiatal hernia is again noted containing the proximal stomach. There is an air-fluid level within the stomach in the hernia sac. Appendix is not seen. No evidence of bowel wall thickening, distention, or inflammatory changes. Colonic diverticulosis is present. Vascular/Lymphatic: Aortic atherosclerosis. No enlarged abdominal or pelvic lymph nodes. Reproductive: Status post hysterectomy. No adnexal masses. Other: No abdominal wall hernia or abnormality. No abdominopelvic ascites. Musculoskeletal: Degenerative changes affect the spine. Left hip screw is present. IMPRESSION: 1. 3 mm calculus in the distal left ureter at the ureterovesicular junction with mild obstructive uropathy. 2. Additional nonobstructing bilateral renal calculi. 3. Large hiatal hernia. 4. Colonic diverticulosis. Aortic Atherosclerosis (ICD10-I70.0). Electronically Signed   By: Darliss Cheney M.D.   On: 09/26/2022 22:14    Pending Labs Unresulted Labs (From admission, onward)     Start     Ordered   09/27/22 0500  Comprehensive metabolic panel  Tomorrow morning,   R        09/27/22 0111   09/27/22 0500  CBC  Tomorrow morning,   R        09/27/22 0111   09/27/22 0500  Magnesium  Tomorrow morning,   R        09/27/22 0111   09/27/22 0500  Phosphorus  Tomorrow morning,   R        09/27/22 0111            Vitals/Pain Today's Vitals   09/26/22 2018 09/26/22 2021 09/26/22 2300 09/27/22 0109  BP: (!) 192/73  (!) 182/74   Pulse: 64  66   Resp: (!) 24  (!) 23   Temp: (!) 97.1 F (36.2 C) 98 F (36.7 C)    TempSrc:  Temporal Oral    SpO2: 98%  100% 100%  Weight:      Height:        Isolation Precautions No active isolations  Medications Medications  ondansetron (ZOFRAN) injection 4 mg (4 mg Intravenous Given 09/26/22  2113)  ciprofloxacin (CIPRO) IVPB 400 mg (400 mg Intravenous New Bag/Given 09/27/22 0039)  heparin injection 5,000 Units (has no administration in time range)  acetaminophen (TYLENOL) tablet 650 mg (has no administration in time range)    Or  acetaminophen (TYLENOL) suppository 650 mg (has no administration in time range)  fentaNYL (SUBLIMAZE) injection 25 mcg (25 mcg Intravenous Given 09/26/22 2130)  lactated ringers bolus 500 mL (0 mLs Intravenous Stopped 09/26/22 2339)  tamsulosin (FLOMAX) capsule 0.4 mg (0.4 mg Oral Given 09/26/22 2335)  fentaNYL (SUBLIMAZE) injection 25 mcg (25 mcg Intravenous Given 09/26/22 2335)  lactated ringers bolus 500 mL (0 mLs Intravenous Stopped 09/27/22 0128)    Mobility walks with device     Focused Assessments Cardiac Assessment Handoff:    Lab Results  Component Value Date   CKTOTAL 48 06/07/2015   TROPONINI <0.03 06/04/2015   No results found for: "DDIMER" Does the Patient currently have chest pain? Yes   ,    R Recommendations: See Admitting Provider Note  Report given to:   Additional Notes:

## 2022-09-27 NOTE — TOC Progression Note (Signed)
Transition of Care [facility]) - Progression Note    Patient Details  Name: Kristy Jarvis MRN: [id number] Date of Birth: 09/07/1920  Transition of Care [facility]) CM/SW Contact  Karn Cassis, [place] Phone Number: 09/27/2022, 2:32 PM  Clinical Narrative:   Transition of Care [facility]) Screening Note   Patient Details  Name: [name] Date of Birth: 07-Aug-1920   Transition of Care [facility]) CM/SW Contact:    Karn Cassis, LCSW Phone Number: 09/27/2022, 2:33 PM    Transition of Care Department [facility]) has reviewed patient and no TOC needs have been identified at this time. We will continue to monitor patient advancement through interdisciplinary progression rounds. If new patient transition needs arise, please place a TOC consult.            Expected Discharge Plan and Services                                               Social Determinants of Health (SDOH) Interventions SDOH Screenings   Food Insecurity: No Food Insecurity (09/27/2022)  Housing: Low Risk  (09/27/2022)  Transportation Needs: No Transportation Needs (09/27/2022)  Utilities: Not At Risk (09/27/2022)  Alcohol Screen: Low Risk  (12/22/2020)  Depression (PHQ2-9): Low Risk  (03/29/2022)  Financial Resource Strain: Low Risk  (12/22/2020)  Physical Activity: Inactive (12/22/2020)  Social Connections: Socially Isolated (12/22/2020)  Stress: No Stress Concern Present (12/22/2020)  Tobacco Use: Low Risk  (09/26/2022)    Readmission Risk Interventions     No data to display

## 2022-09-27 NOTE — Progress Notes (Signed)
Nsg Discharge Note  Admit Date:  09/26/2022 Discharge date: 09/27/2022   Kristy Jarvis to be D/C'd Home per MD order.  AVS completed.  Copy for chart, and copy for patient signed, and dated. Patient/caregiver able to verbalize understanding.  Discharge Medication: Allergies as of 09/27/2022       Reactions   Alendronate Sodium Other (See Comments)   Chest Pain, Fatigue    Penicillins Rash   Has patient had a PCN reaction causing immediate rash, facial/tongue/throat swelling, SOB or lightheadedness with hypotension: unknown Has patient had a PCN reaction causing severe rash involving mucus membranes or skin necrosis: unknown Has patient had a PCN reaction that required hospitalization no Has patient had a PCN reaction occurring within the last 10 years: no If all of the above answers are "NO", then may proceed with Ceph        Medication List     TAKE these medications    famotidine 20 MG tablet Commonly known as: PEPCID TAKE 1 TABLET BY MOUTH ONCE DAILY.   losartan 50 MG tablet Commonly known as: COZAAR Take 1 tablet (50 mg total) by mouth daily.   pantoprazole 40 MG tablet Commonly known as: PROTONIX TAKE (1) TABLET BY MOUTH ONCE DAILY. 30-60 MINUTES BEFORE FIRST MEAL   VITAMIN D PO Take 1 tablet by mouth daily.        Discharge Assessment: Vitals:   09/27/22 1051 09/27/22 1501  BP: (!) 105/47 (!) 116/54  Pulse: 60 63  Resp: 20 18  Temp: 97.7 F (36.5 C) 98.4 F (36.9 C)  SpO2: 95% 96%   Skin clean, dry and intact without evidence of skin break down, no evidence of skin tears noted. IV catheter discontinued intact. Site without signs and symptoms of complications - no redness or edema noted at insertion site, patient denies c/o pain - only slight tenderness at site.  Dressing with slight pressure applied.  D/c Instructions-Education: Discharge instructions given to patient/family with verbalized understanding. D/c education completed with patient/family  including follow up instructions, medication list, d/c activities limitations if indicated, with other d/c instructions as indicated by MD - patient able to verbalize understanding, all questions fully answered. Patient instructed to return to ED, call 911, or call MD for any changes in condition.  Patient escorted via WC, and D/C home via private auto.  Demetrio Lapping, LPN 07/22/4399 0:27 PM

## 2022-09-27 NOTE — H&P (Signed)
History and Physical    Patient: Kristy Jarvis:[id number] DOB: 06/22/1920 DOA: 09/26/2022 DOS: the patient was seen and examined on 09/27/2022 PCP: Kerri Perches, MD  Patient coming from: Home  Chief Complaint:  Chief Complaint  Patient presents with   Emesis   HPI: Kristy Jarvis is a 89 y.o. female with medical history significant of hypertension, GERD who presents to the emergency department due to abdominal pain, nausea and vomiting which started around 3 PM yesterday.  Patient lives with daughter and usually goes to an adult daycare during the day.  She was at her baseline of normal health function in the morning prior to going to the adult daycare, but on returning home from daytime activities, she started to have nausea and several episodes of nonbloody vomiting (around 3 PM), patient has not been able to tolerate any oral intake since onset of symptoms.  She endorsed left lower quadrant abdominal pain and denies fever, chills, chest pain, shortness of breath.  Last bowel movement was while in the ED.  ED Course:  In the emergency department, respiratory rate was 24/min, BP was 192/73, other vital signs were within normal range.  Workup in the ED showed normal CBC, BMP was normal except for blood glucose of 189, BUNs/creatinine 25/1.68 (creatinine is at baseline).  Magnesium 2.1, troponin x 2 was normal, urinalysis was positive for hematuria. CT abdomen and pelvis without contrast showed 3 mm calculus in the distal left ureter at the ureterovesicular junction with mild obstructive uropathy. IV hydration was provided, patient was empirically started on IV ciprofloxacin, pain medication with fentanyl was given, Flomax was also provided.  Hospitalist was asked to admit patient for further evaluation and management.  Review of Systems: Review of systems as noted in the HPI. All other systems reviewed and are negative.   Past Medical History:  Diagnosis Date   Cataract     Diverticulosis of colon    Noted by CT   GERD (gastroesophageal reflux disease)    Hyperlipidemia    Intermittent vertigo    Osteoporosis    Renal colic    Past Surgical History:  Procedure Laterality Date   ABDOMINAL HYSTERECTOMY     BUNIONECTOMY  06/27/2011   left , Dr Pricilla Holm   CATARACT EXTRACTION  2002   left eye    CHOLECYSTECTOMY  1989   ESOPHAGOGASTRODUODENOSCOPY  Jan  2007   prominent schatzki's ring, s/p 41F, small to moderate size hh   EYE SURGERY  2005 approx   cataract left   HIP SURGERY Left 12/20/2013   INTRAMEDULLARY (IM) NAIL INTERTROCHANTERIC Left 01/02/2014   Procedure: INTRAMEDULLARY (IM) NAIL INTERTROCHANTRIC;  Surgeon: Nadara Mustard, MD;  Location: [facility] OR;  Service: Orthopedics;  Laterality: Left;    Social History:  reports that she has never smoked. She has never used smokeless tobacco. She reports that she does not drink alcohol and does not use drugs.   Allergies  Allergen Reactions   Alendronate Sodium Other (See Comments)    Chest Pain, Fatigue    Penicillins Rash    Has patient had a PCN reaction causing immediate rash, facial/tongue/throat swelling, SOB or lightheadedness with hypotension: unknown Has patient had a PCN reaction causing severe rash involving mucus membranes or skin necrosis: unknown Has patient had a PCN reaction that required hospitalization no Has patient had a PCN reaction occurring within the last 10 years: no If all of the above answers are "NO", then may proceed with Ceph  Family History  Problem Relation Age of Onset   Heart attack Father    Diabetes Sister    Diabetes Sister    Diabetes Sister    Parkinson's disease Sister    Liver cancer Other        family history    Heart disease Brother    Prostate cancer Brother    Colon cancer Neg Hx    Anesthesia problems Neg Hx    Hypotension Neg Hx    Malignant hyperthermia Neg Hx    Pseudochol deficiency Neg Hx      Prior to Admission medications   Medication Sig  Start Date End Date Taking? Authorizing Provider  Cholecalciferol (VITAMIN D PO) Take 1 tablet by mouth daily.    [provider]  famotidine (PEPCID) 20 MG tablet TAKE 1 TABLET BY MOUTH ONCE DAILY. 07/20/22   Kerri Perches, MD  losartan (COZAAR) 50 MG tablet Take 1 tablet (50 mg total) by mouth daily. 04/11/22   Kerri Perches, MD  pantoprazole (PROTONIX) 40 MG tablet TAKE (1) TABLET BY MOUTH ONCE DAILY. 30-60 MINUTES BEFORE FIRST MEAL 08/31/22   Kerri Perches, MD    Physical Exam: BP 121/63 (BP Location: Right Arm)   Pulse 66   Temp (!) 97.5 F (36.4 C) (Oral)   Resp (!) 30   Ht 4\' 10"  (1.473 m)   Wt 42.6 kg   SpO2 95%   BMI 19.63 kg/m   General: 89 y.o. year-old female well developed well nourished in no acute distress.  Alert and oriented x3. HEENT: NCAT, EOMI Neck: Supple, trachea medial Cardiovascular: Regular rate and rhythm with no rubs or gallops.  No thyromegaly or JVD noted.  No lower extremity edema. 2/4 pulses in all 4 extremities. Respiratory: Clear to auscultation with no wheezes or rales. Good inspiratory effort. Abdomen: Soft, tender to palpation of left lower quadrant without guarding.  Nondistended with normal bowel sounds x4 quadrants. Muskuloskeletal: No cyanosis, clubbing or edema noted bilaterally Neuro: CN II-XII intact, sensation, reflexes intact Skin: No ulcerative lesions noted or rashes Psychiatry: Mood is appropriate for condition and setting          Labs on Admission:  Basic Metabolic Panel: Recent Labs  Lab 09/26/22 2055  NA 139  K 4.5  CL 102  CO2 24  GLUCOSE 189*  BUN 25*  CREATININE 1.68*  CALCIUM 9.6  MG 2.1   Liver Function Tests: Recent Labs  Lab 09/26/22 2055  AST 27  ALT 13  ALKPHOS 96  BILITOT 0.8  PROT 7.8  ALBUMIN 4.1   Recent Labs  Lab 09/26/22 2055  LIPASE 33   No results for input(s): "AMMONIA" in the last 168 hours. CBC: Recent Labs  Lab 09/26/22 2055  WBC 10.1  HGB 13.9  HCT  43.8  MCV 92.4  PLT 254   Cardiac Enzymes: No results for input(s): "CKTOTAL", "CKMB", "CKMBINDEX", "TROPONINI" in the last 168 hours.  BNP (last 3 results) No results for input(s): "BNP" in the last 8760 hours.  ProBNP (last 3 results) No results for input(s): "PROBNP" in the last 8760 hours.  CBG: No results for input(s): "GLUCAP" in the last 168 hours.  Radiological Exams on Admission: CT Renal Stone Study  Result Date: 09/26/2022 CLINICAL DATA:  Vomiting and left lower quadrant pain. EXAM: CT ABDOMEN AND PELVIS WITHOUT CONTRAST TECHNIQUE: Multidetector CT imaging of the abdomen and pelvis was performed following the standard protocol without IV contrast. RADIATION DOSE REDUCTION: This exam  was performed according to the departmental dose-optimization program which includes automated exposure control, adjustment of the mA and/or kV according to patient size and/or use of iterative reconstruction technique. COMPARISON:  CT renal stone 09/17/2017. FINDINGS: Lower chest: No acute abnormality. Hepatobiliary: No focal liver abnormality is seen. Status post cholecystectomy. No biliary dilatation. Pancreas: Unremarkable. No pancreatic ductal dilatation or surrounding inflammatory changes. Spleen: Normal in size without focal abnormality. Adrenals/Urinary Tract: There is a 3 mm calculus in the distal left ureter at the ureterovesicular junction. There is mild left-sided hydronephrosis. There is an additional punctate left renal calculus. Right renal calculi are also present measuring up to 6 mm. There is no right-sided hydronephrosis. The adrenal glands and bladder are within normal limits. Stomach/Bowel: Large hiatal hernia is again noted containing the proximal stomach. There is an air-fluid level within the stomach in the hernia sac. Appendix is not seen. No evidence of bowel wall thickening, distention, or inflammatory changes. Colonic diverticulosis is present. Vascular/Lymphatic: Aortic  atherosclerosis. No enlarged abdominal or pelvic lymph nodes. Reproductive: Status post hysterectomy. No adnexal masses. Other: No abdominal wall hernia or abnormality. No abdominopelvic ascites. Musculoskeletal: Degenerative changes affect the spine. Left hip screw is present. IMPRESSION: 1. 3 mm calculus in the distal left ureter at the ureterovesicular junction with mild obstructive uropathy. 2. Additional nonobstructing bilateral renal calculi. 3. Large hiatal hernia. 4. Colonic diverticulosis. Aortic Atherosclerosis (ICD10-I70.0). Electronically Signed   By: Darliss Cheney M.D.   On: 09/26/2022 22:14    EKG: I independently viewed the EKG done and my findings are as followed: Normal sinus rhythm at a rate of 66 bpm  Assessment/Plan Present on Admission:  Abdominal pain  Nausea & vomiting  Hyperglycemia  Essential hypertension  GERD (gastroesophageal reflux disease)  Principal Problem:   Nephrolithiasis Active Problems:   GERD (gastroesophageal reflux disease)   Nausea & vomiting   Hyperglycemia   Abdominal pain   Essential hypertension   Prediabetes   Hypertensive urgency  Acute nephrolithiasis CT abdomen and pelvis showed 3 mm calculus at the left ureteric pelvic junction with obstructive uropathy Continue IV hydration Continue IV fentanyl for pain  Abdominal pain, nausea and vomiting possibly secondary to above Continue Zofran as needed Continue IV fentanyl as needed  Questionable UTI POA Patient was empirically started on ciprofloxacin No reported irritative bladder symptoms and urinalysis was unimpressive for UTI, other than hematuria which may be due to the nephrolithiasis. I will hold off on antibiotics for now Urine culture pending  Hyperglycemia secondary to prediabetes Hemoglobin A1c on 03/29/2022 was 5.8 Continue diet modification at this time  Hypertensive urgency-resolved Essential hypertension Continue home meds  GERD Continue home meds  DVT  prophylaxis: Heparin subcu  Advance Care Planning:   Code Status: DNR   Consults: None  Family Communication: None at bedside  Severity of Illness: The appropriate patient status for this patient is OBSERVATION. Observation status is judged to be reasonable and necessary in order to provide the required intensity of service to ensure the patient's safety. The patient's presenting symptoms, physical exam findings, and initial radiographic and laboratory data in the context of their medical condition is felt to place them at decreased risk for further clinical deterioration. Furthermore, it is anticipated that the patient will be medically stable for discharge from the hospital within 2 midnights of admission.   Author: Frankey Shown, DO 09/27/2022 5:16 AM  For on call review [email or web address].

## 2022-10-05 ENCOUNTER — Ambulatory Visit (INDEPENDENT_AMBULATORY_CARE_PROVIDER_SITE_OTHER): Payer: Medicare HMO | Admitting: Family Medicine

## 2022-10-05 ENCOUNTER — Encounter: Payer: Self-pay | Admitting: Family Medicine

## 2022-10-05 VITALS — BP 142/82 | HR 85 | Ht <= 58 in | Wt 89.1 lb

## 2022-10-05 DIAGNOSIS — N200 Calculus of kidney: Secondary | ICD-10-CM

## 2022-10-05 DIAGNOSIS — G3184 Mild cognitive impairment, so stated: Secondary | ICD-10-CM | POA: Diagnosis not present

## 2022-10-05 DIAGNOSIS — Z09 Encounter for follow-up examination after completed treatment for conditions other than malignant neoplasm: Secondary | ICD-10-CM | POA: Diagnosis not present

## 2022-10-05 DIAGNOSIS — K2100 Gastro-esophageal reflux disease with esophagitis, without bleeding: Secondary | ICD-10-CM

## 2022-10-05 DIAGNOSIS — I10 Essential (primary) hypertension: Secondary | ICD-10-CM

## 2022-10-05 DIAGNOSIS — J439 Emphysema, unspecified: Secondary | ICD-10-CM | POA: Diagnosis not present

## 2022-10-05 MED ORDER — LORATADINE 5 MG PO CHEW: 5.0000 mg | CHEWABLE_TABLET | Freq: Every day | ORAL | 5 refills | Status: DC

## 2022-10-05 NOTE — Assessment & Plan Note (Signed)
Patient in for follow up of recent hospitalization. Discharge summary, and laboratory and radiology data are reviewed, and any questions or concerns  are discussed. Specific issues requiring follow up are specifically addressed.  

## 2022-10-05 NOTE — Assessment & Plan Note (Signed)
Recently spontaneously passed kidney stone during hospitalization

## 2022-10-05 NOTE — Patient Instructions (Addendum)
F/U in September, call if you need me sooner  You will be contacted to collect paperwork if nurse unable to return them at end of visit today  Be careful not to falladn continue to enjoy every moment of life the best you can  Thanks for choosing [facility], we consider it a privelige to serve you.

## 2022-10-05 NOTE — Assessment & Plan Note (Signed)
Adeqaute control, no change in management

## 2022-10-05 NOTE — Progress Notes (Signed)
   Kristy Jarvis     MRN: [id number]      DOB: [birth year]  Chief Complaint  Patient presents with   Follow-up    Follow up seen last week at er for kidney stone, family reports she is staying tired alot  Hospitalized from 5/7 to 09/27/2022 because of nephrolithiasis, and she apparently spontaneously passed the stone the following day and was d/c home  HPI Kristy Jarvis is here for follow up of recent hospitalization. Daughter reports that sh  seems more tired since being discharged , [facility] and [facility] Appetite baseline , no falls  ROS Denies recent fever or chills. Denies sinus pressure, nasal congestion, ear pain or sore throat.uses claritin as needed , for excess drainage and cough Denies chest congestion, productive cough or wheezing. Denies chest pains, palpitations and leg swelling Denies abdominal pain, nausea, vomiting,diarrhea or constipation.   Denies dysuria, frequency, hesitancy or incontinence. C/o limitation in mobility.uses  walker Denies headaches, seizures, numbness, or tingling. Denies depression, anxiety or insomnia. Denies skin break down or rash.   PE  BP (!) 142/82 (BP Location: Left Arm, Patient Position: Sitting, Cuff Size: Small)   Pulse 85   Ht 4\' 10"  (1.473 m)   Wt 89 lb 1.9 oz (40.4 kg)   SpO2 98%   BMI 18.63 kg/m   Patient alert and in no cardiopulmonary distress.  HEENT: No facial asymmetry, EOMI,     Neck decreased ROM.  Chest: Clear to auscultation bilaterally.  CVS: S1, S2 no murmurs, no S3.Regular rate.  ABD: Soft non tender.   Ext: No edema  MS: decreased  ROM spine, shoulders, hips and knees.  Skin: Intact, no ulcerations or rash noted.  Psych: Good eye contact, normal affect.  not anxious or depressed appearing.  CNS: CN 2-12 intact, power,  normal throughout.no focal deficits noted.   Assessment & Plan  Essential hypertension Adeqaute control, no change in  management  Nephrolithiasis Recently spontaneously passed kidney stone during hospitalization  GERD (gastroesophageal reflux disease) Maintained on PPI and H2 blocker continue same  MCI (mild cognitive impairment) Age appropriate no treatment indicated   Hospital discharge follow-up Patient in for follow up of recent hospitalization. Discharge summary, and laboratory and radiology data are reviewed, and any questions or concerns  are discussed. Specific issues requiring follow up are specifically addressed.

## 2022-10-05 NOTE — Assessment & Plan Note (Signed)
Maintained on PPI and H2 blocker continue same

## 2022-10-05 NOTE — Assessment & Plan Note (Signed)
Age appropriate no treatment indicated

## 2023-02-01 ENCOUNTER — Telehealth: Payer: Self-pay | Admitting: Family Medicine

## 2023-02-01 NOTE — Telephone Encounter (Signed)
FL2  Noted  Copied Sleeved  Original in PCP box Copy front desk folder

## 2023-02-05 NOTE — Telephone Encounter (Signed)
Crystal called said patient moving in to [facility] on Thursday, 02/08/2023. Need the FL2 back before then. Their fax [phone]  All crystal at [phone] when faxed over.

## 2023-02-06 ENCOUNTER — Other Ambulatory Visit: Payer: Self-pay

## 2023-02-06 ENCOUNTER — Encounter: Payer: Self-pay | Admitting: Family Medicine

## 2023-02-06 ENCOUNTER — Ambulatory Visit (INDEPENDENT_AMBULATORY_CARE_PROVIDER_SITE_OTHER): Admitting: Family Medicine

## 2023-02-06 VITALS — BP 142/78 | HR 87 | Ht <= 58 in | Wt 86.0 lb

## 2023-02-06 DIAGNOSIS — K2100 Gastro-esophageal reflux disease with esophagitis, without bleeding: Secondary | ICD-10-CM

## 2023-02-06 DIAGNOSIS — G3184 Mild cognitive impairment, so stated: Secondary | ICD-10-CM

## 2023-02-06 DIAGNOSIS — I10 Essential (primary) hypertension: Secondary | ICD-10-CM

## 2023-02-06 DIAGNOSIS — R7303 Prediabetes: Secondary | ICD-10-CM

## 2023-02-06 DIAGNOSIS — E785 Hyperlipidemia, unspecified: Secondary | ICD-10-CM

## 2023-02-06 DIAGNOSIS — E559 Vitamin D deficiency, unspecified: Secondary | ICD-10-CM

## 2023-02-06 DIAGNOSIS — Z23 Encounter for immunization: Secondary | ICD-10-CM

## 2023-02-06 DIAGNOSIS — Z66 Do not resuscitate: Secondary | ICD-10-CM | POA: Diagnosis not present

## 2023-02-06 NOTE — Assessment & Plan Note (Signed)
DNR form completed and gave to daughter at the visit after discussing significance of this decision , daughter feels and rightly so, that this is best for her mother. She understands that this does not mean tat Kristy Jarvis will not be treated for treatable conditions

## 2023-02-06 NOTE — Assessment & Plan Note (Signed)
Controlled, no change in medication  

## 2023-02-06 NOTE — Assessment & Plan Note (Signed)
Ongoing decline in lveel of functioning, good candidate for assisted living facility, paperwork completed and given to daughter, she should move in , in the next 2 days

## 2023-02-06 NOTE — Patient Instructions (Addendum)
F/U in 4 months, call if you need me sooner   Flu vaccine today  YOU NEED TaP, THIS IS AT YOUR PHARMACY  LABS TODAY cbc, CHEM 7 , vIT d AND Hba1C  I recommend strongly having a Most or DNR form as we discussed, done at visit   I expect that you will do even better than you have been doing [facility] when you [facility] in at [facility]  Happy 89 birthday 05/06/2021!!!  Thanks for choosing [facility], we consider it a privelige to serve you.

## 2023-02-06 NOTE — Progress Notes (Signed)
BRAELEY HEGGINS     MRN: [id number]      DOB: June 28, 1920  Chief Complaint  Patient presents with   Follow-up    Follow up FL2 form in box to be filled out    HPI Ms. Revak is here for follow up and re-evaluation of chronic medical conditions, medication management and review of any available recent lab and radiology data.  Preventive health is updated, specifically  Immunization.   Plan is to move her into assisted living facility , as for the past 3 months , she has elected to stay in bed, rather than get [facility] and [facility] Daughter , Synetta Fail is ready to move her into a facility We also discussed DNR status , and Synetta Fail has decided to make her DNR which is very reasonable based on her advanced age and declining quality of life and level of functioning  ROS history from daughter Denies recent fever or chills. Denies sinus pressure, nasal congestion, ear pain or sore throat. Denies chest congestion, productive cough or wheezing. Denies chest pains, palpitations and leg swelling Denies abdominal pain, nausea, vomiting,diarrhea or constipation.   Denies dysuria, frequency, hesitancy or incontinence. Chronic  joint pain, swelling and limitation in mobility. Denies headaches, seizures, numbness, or tingling. Denies depression, anxiety or insomnia. Denies skin break down or rash.   PE  BP (!) 142/78 (BP Location: Left Arm, Patient Position: Sitting, Cuff Size: Normal)   Pulse 87   Ht 4\' 10"  (1.473 m)   Wt 86 lb (39 kg)   SpO2 95%   BMI 17.97 kg/m   Patient alert and in no cardiopulmonary distress.  HEENT: No facial asymmetry, EOMI,     Neck decreased ROM .  Chest: Clear to auscultation bilaterally.  CVS: S1, S2 no murmurs, no S3.Regular rate.  ABD: Soft non tender.   Ext: No edema  MS: decreased  ROM spine, shoulders, hips and knees.  Skin: Intact, no ulcerations or rash noted.  Psych: Good eye contact, normal affect. not anxious or depressed  appearing.  CNS: CN 2-12 intact, power,  normal throughout.no focal deficits noted.   Assessment & Plan  DNR (do not resuscitate) DNR form completed and gave to daughter at the visit after discussing significance of this decision , daughter feels and rightly so, that this is best for her mother. She understands that this does not mean tat Ms Yance will not be treated for treatable conditions  Essential hypertension Controlled, no change in medication   GERD (gastroesophageal reflux disease) Controlled, no change in medication   MCI (mild cognitive impairment) Ongoing decline in lveel of functioning, good candidate for assisted living facility, paperwork completed and given to daughter, she should move in , in the next 2 days  Prediabetes Updated lab needed at/ before next visit.

## 2023-02-06 NOTE — Assessment & Plan Note (Signed)
Updated lab needed at/ before next visit.   

## 2023-02-07 NOTE — Telephone Encounter (Signed)
Called [phone] Kristy Jarvis at [facility] left her a voicemail that forms at front desk ready for pick up.

## 2023-02-19 ENCOUNTER — Telehealth: Payer: Self-pay | Admitting: Family Medicine

## 2023-02-19 NOTE — Telephone Encounter (Signed)
[place] forms for assistance living for Kristy Jarvis call patient daughter Kristy Jarvis [phone]   Noted Copied Sleeved (put in provider box)

## 2023-03-09 NOTE — Telephone Encounter (Signed)
Called daughter Synetta Fail forms are already for pick up. Forms at front desk completed folder.

## 2023-04-10 DIAGNOSIS — N390 Urinary tract infection, site not specified: Secondary | ICD-10-CM | POA: Diagnosis not present

## 2023-06-07 ENCOUNTER — Ambulatory Visit: Payer: Self-pay | Admitting: *Deleted

## 2023-06-07 ENCOUNTER — Encounter: Payer: Self-pay | Admitting: Family Medicine

## 2023-06-07 ENCOUNTER — Ambulatory Visit (INDEPENDENT_AMBULATORY_CARE_PROVIDER_SITE_OTHER): Payer: Medicare HMO | Admitting: Family Medicine

## 2023-06-07 VITALS — BP 138/68 | HR 83 | Ht <= 58 in | Wt 88.0 lb

## 2023-06-07 DIAGNOSIS — E559 Vitamin D deficiency, unspecified: Secondary | ICD-10-CM

## 2023-06-07 DIAGNOSIS — Z9181 History of falling: Secondary | ICD-10-CM

## 2023-06-07 DIAGNOSIS — F03C0 Unspecified dementia, severe, without behavioral disturbance, psychotic disturbance, mood disturbance, and anxiety: Secondary | ICD-10-CM

## 2023-06-07 DIAGNOSIS — R739 Hyperglycemia, unspecified: Secondary | ICD-10-CM

## 2023-06-07 DIAGNOSIS — N184 Chronic kidney disease, stage 4 (severe): Secondary | ICD-10-CM

## 2023-06-07 DIAGNOSIS — I10 Essential (primary) hypertension: Secondary | ICD-10-CM | POA: Diagnosis not present

## 2023-06-07 DIAGNOSIS — I160 Hypertensive urgency: Secondary | ICD-10-CM

## 2023-06-07 DIAGNOSIS — R42 Dizziness and giddiness: Secondary | ICD-10-CM

## 2023-06-07 DIAGNOSIS — Z59819 Housing instability, housed unspecified: Secondary | ICD-10-CM

## 2023-06-07 DIAGNOSIS — Z66 Do not resuscitate: Secondary | ICD-10-CM

## 2023-06-07 DIAGNOSIS — E785 Hyperlipidemia, unspecified: Secondary | ICD-10-CM

## 2023-06-07 DIAGNOSIS — R7303 Prediabetes: Secondary | ICD-10-CM

## 2023-06-07 DIAGNOSIS — R636 Underweight: Secondary | ICD-10-CM

## 2023-06-07 NOTE — Patient Outreach (Addendum)
  Care Coordination   Initial Visit Note   06/08/2023 Name: KHAZA ZIMPFER MRN: [id number] DOB: April 12, 1921  REMEIGH DITSWORTH is a 89 y.o. year old female who sees Kerri Perches, MD for primary care. I spoke with daughter Synetta Fail of [facility] by phone today.  What matters to the patients health and wellness today?  Affordable level of care change Presently at [facility] assisted living facility (ALF). Daughter Synetta Fail is the contact. States patient is running out of money to stay there.  Synetta Fail has not shared this with the ALF SW. Reports she has not had contact with the facility SW/CM, only arrival of the bills.  Encouraged her speak with ALF SW but she would also like to discuss with [facility] SW. No Medicaid. Encouraged daughter to apply for medicaid also She voiced understanding of the education on medicaid, levels of care, access to facility SW/CM for medicaid application and change of facility.  Voiced understanding of [facility] services. Appreciative of referral to [facility] SW for resources/knowledge, for RN CM outreach & updates to patient pcp    Goals Addressed             This Visit's Progress    will receive communit resources to help patient with a new level of care facility- [facility] RN CM   Not on track    Interventions Today    Flowsheet Row Most Recent Value  Chronic Disease   Chronic disease during today's visit Other  [affordable housing only has medicaid funds runs out of money soon Encouraged daughter [place]- referred by pcp today via EPIC Team discussion of needs]  General Interventions   General Interventions Discussed/Reviewed General Interventions Discussed, [employer], Level of Care  [will receive communit resources to [employer] patient with a new level of care facility]  Communication with PCP/Specialists, Social Work  Public house manager with pcp, [facility] SW referral order completed for needed assistance to a more affordable assisted living facility compared to the  present one]  Level of Care Assisted Living  [confirmed patient is presently in [facility] assisted living]  Education Interventions   Education Provided Provided Education  Provided Verbal Education On [employer], [employer], Other  Advanced Directive Interventions   Advanced Directives Discussed/Reviewed Advanced Directives Discussed, Company secretary documents scanned in EPIC]              SDOH assessments and interventions completed:  Yes  SDOH Interventions Today    Flowsheet Row Most Recent Value  SDOH Interventions   Food Insecurity Interventions Intervention Not Indicated  Transportation Interventions Intervention Not Indicated  Utilities Interventions Intervention Not Indicated  Financial Strain Interventions Intervention Not Indicated        Care Coordination Interventions:  Yes, provided   Follow up plan: Follow up call scheduled for 06/14/23 3 pm    Encounter Outcome:  Patient Visit Completed    Dorsie Sethi L. Noelle Penner, RN, BSN, CCM [facility], [facility] RN Care Manager Direct Dial: [phone]  Fax: [phone] Mailing Address: [address]  [place] [address] Website: [email or web address]

## 2023-06-07 NOTE — Patient Instructions (Addendum)
F/U in 4 months, call if you need me sooner  You are referred to case manager/social worker, your daughter Synetta Fail , will be contacted , as we try to get help with finding a good living situation for you  TSH, chem 7 and Magnesium level today   No change in medication at this time  When you have anxious spells/ tremor,  best for whoever is with you to reassure you and calm you down, medication for this is not indicated at this time  CONGRATS on 89 GREAT years!  Thanks for choosing [facility], we consider it a privelige to serve you.

## 2023-06-08 ENCOUNTER — Telehealth: Payer: Self-pay | Admitting: *Deleted

## 2023-06-08 LAB — BMP8+EGFR
BUN/Creatinine Ratio: 13 (ref 12–28)
BUN: 23 mg/dL (ref 10–36)
CO2: 22 mmol/L (ref 20–29)
Calcium: 9.6 mg/dL (ref 8.7–10.3)
Chloride: 106 mmol/L (ref 96–106)
Creatinine, Ser: 1.71 mg/dL — ABNORMAL HIGH (ref 0.57–1.00)
Glucose: 103 mg/dL — ABNORMAL HIGH (ref 70–99)
Potassium: 4.4 mmol/L (ref 3.5–5.2)
Sodium: 143 mmol/L (ref 134–144)
eGFR: 26 mL/min/{1.73_m2} — ABNORMAL LOW (ref >59)

## 2023-06-08 LAB — MAGNESIUM: Magnesium: 2.1 mg/dL (ref 1.6–2.3)

## 2023-06-08 LAB — TSH: TSH: 1.02 u[IU]/mL (ref 0.450–4.500)

## 2023-06-08 NOTE — Progress Notes (Signed)
Complex Care Management Note Care Guide Note  06/08/2023 Name: Kristy Jarvis MRN: [id number] DOB: 1920/11/11   Complex Care Management Outreach Attempts: An unsuccessful telephone outreach was attempted today to offer the patient information about available complex care management services.  Follow Up Plan:  Additional outreach attempts will be made to offer the patient complex care management information and services.   Encounter Outcome:  Patient Request to Call Back Daughter [name]  [facility], [facility] Guide  Direct Dial: [phone]  Fax [phone]

## 2023-06-08 NOTE — Patient Instructions (Signed)
Visit Information  Thank you for taking time to visit with me today. Please don't hesitate to contact me if I can be of assistance to you.   Following are the goals we discussed today:   Goals Addressed             This Visit's Progress    will receive communit resources to help patient with a new level of care facility- VBCI RN CM   Not on track    Interventions Today    Flowsheet Row Most Recent Value  Chronic Disease   Chronic disease during today's visit Other  [affordable housing only has medicaid funds runs out of money soon Encouraged daughter [facility]- referred by pcp today via EPIC Team discussion of needs]  General Interventions   General Interventions Discussed/Reviewed General Interventions Discussed, [employer], Level of Care  [will receive communit resources to [employer] patient with a new level of care facility]  Communication with PCP/Specialists, Social Work  Public house manager with pcp, VBCI SW referral order completed for needed assistance to a more affordable assisted living facility compared to the present one]  Level of Care Assisted Living  [confirmed patient is presently in [place] assisted living]  Education Interventions   Education Provided Provided Education  Provided Verbal Education On [employer], [employer], Other  Advanced Directive Interventions   Advanced Directives Discussed/Reviewed Advanced Directives Discussed, Advanced Care Planning  [has documents scanned in EPIC]              Our next appointment is by telephone on 06/14/23 at 3 pm  Please call the care guide team at [phone] if you need to cancel or reschedule your appointment.   If you are experiencing a Mental Health or Behavioral Health Crisis or need someone to talk to, please call the Suicide and Crisis Lifeline: 988 call the [place] National Suicide Prevention Lifeline: 904-690-9616 or TTY: 641-240-9283 TTY (234)599-4455) to talk to a trained counselor call  1-800-273-TALK (toll free, 24 hour hotline) call the [facility]: [phone] call 911   Patient verbalizes understanding of instructions and care plan provided today and agrees to view in MyChart. Active MyChart status and patient understanding of how to access instructions and care plan via MyChart confirmed with patient.     The patient has been provided with contact information for the care management team and has been advised to call with any health related questions or concerns.   Willis Kuipers L. Noelle Penner, RN, BSN, CCM [place]  [facility], [facility] Health RN Care Manager Direct Dial: [phone]  Fax: [phone] Mailing Address: [address]  [place] [address] Website: [email or web address]

## 2023-06-08 NOTE — Progress Notes (Signed)
Complex Care Management Care Guide Note  06/08/2023 Name: Kristy Jarvis MRN: [id number] DOB: 09/22/1920  Kristy Jarvis is a 89 y.o. year old female who is a primary care patient of Deshanda, Ameri, MD and is actively engaged with the care management team. I reached out to Kristy Jarvis by phone today to assist with scheduling  with the Licensed Clinical Social Worker.  Follow up plan: Telephone appointment with complex care management team member scheduled for:  06/20/23  Gwenevere Ghazi  [facility]  [facility], [facility] Guide  Direct Dial: [phone]  Fax [phone]

## 2023-06-14 ENCOUNTER — Ambulatory Visit: Payer: Self-pay | Admitting: *Deleted

## 2023-06-14 NOTE — Patient Outreach (Signed)
  Care Coordination   Follow Up Visit Note   06/14/2023 Name: Kristy Jarvis MRN: [id number] DOB: 1921/02/24  Kristy Jarvis is a 89 y.o. year old female who sees Kerri Perches, MD for primary care. I spoke with  Clarisse Gouge by phone today.  What matters to the patients health and wellness today?  Follow up with family to confirm a pending call from J Saporito on 06/20/23 VBCI SW Mick Sell did outreach after RN CM also  Patient doing okay per family and pending further SW services     Goals Addressed             This Visit's Progress    will receive communit resources to help patient with a new level of care facility- VBCI RN CM   On track    Interventions Today    Flowsheet Row Most Recent Value  Chronic Disease   Chronic disease during today's visit --  [new facility needs, assessed for outreach from referral staff. Confirmed an outreach received & has pending outreach on 06/20/23]  General Interventions   General Interventions Discussed/Reviewed General Interventions Reviewed, Community Resources, Level of Care  Level of Care Assisted Living  Mental Health Interventions   Mental Health Discussed/Reviewed Mental Health Discussed, Coping Strategies  [caregiver is managing & pending further outreach]  Nutrition Interventions   Nutrition Discussed/Reviewed Nutrition Discussed  [taken care of at [facility]]  Pharmacy Interventions   Pharmacy Dicussed/Reviewed Pharmacy Topics Discussed  [taken care of at [facility]]  Safety Interventions   Safety Discussed/Reviewed Safety Discussed  [taken care of at [facility]]              SDOH assessments and interventions completed:  No     Care Coordination Interventions:  No, not indicated   Follow up plan: Follow up call scheduled for 06/22/23 3 pm     Encounter Outcome:  Patient Visit Completed   Katy Brickell L. Noelle Penner, RN, BSN, CCM [place]  [facility], [facility] Health RN Care  Manager Direct Dial: [phone]  Fax: [phone] Mailing Address: [address]  [place] [address] Website: [email or web address]

## 2023-06-20 ENCOUNTER — Ambulatory Visit: Payer: Self-pay | Admitting: *Deleted

## 2023-06-21 ENCOUNTER — Encounter: Payer: Self-pay | Admitting: *Deleted

## 2023-06-21 NOTE — Patient Instructions (Signed)
Visit Information  Thank you for taking time to visit with me today. Please don't hesitate to contact me if I can be of assistance to you.   Following are the goals we discussed today:   Goals Addressed             This Visit's Progress    Receive Assistance Pursuing Long-Term Care Placement Arrangements.   On track    Care Coordination Interventions:  Interventions Today    Flowsheet Row Most Recent Value  Chronic Disease   Chronic disease during today's visit Hypertension (HTN), Chronic Kidney Disease/End Stage Renal Disease (ESRD), Other  [Mild Cognitive Impairment, Daughter Requesting Assistance Relocating Patient to Long-Term Care Medicaid Bed Once Private Pay Status Terminates at [facility] in March of 2025.]  General Interventions   General Interventions Discussed/Reviewed General Interventions Discussed, Labs, Annual Foot Exam, Lipid Profile, General Interventions Reviewed, Durable Medical Equipment (DME), Annual Eye Exam, Vaccines, Community Resources, Level of Care, Communication with, Health Screening, Doctor Visits, Referral to Nurse  [Encouraged Routine Engagement with Care Team Members & Providers.]  Labs Hgb A1c every 3 months, Kidney Function, Hgb A1c annually  [Encouraged Routine Lab Work.]  Vaccines Flu, Pneumonia, RSV, COVID-19, Shingles, Tetanus/Pertussis/Diphtheria  [Encouraged Routine Vaccinations.]  Doctor Visits Discussed/Reviewed Doctor Visits Discussed, Specialist, Doctor Visits Reviewed, Annual Wellness Visits, PCP  [Encouraged Routine Engagement with Care Team Members & Providers.]  Health Screening Bone Density, Colonoscopy, Mammogram  [Encouraged Routine Health Screenings.]  Durable Medical Equipment (DME) Bed side commode, BP Cuff, Environmental consultant, Wheelchair, Tour manager, Other  [Prescription Eyeglasses, Medical laboratory scientific officer, Sports administrator, Psychologist, prison and probation services Hose.]  Wheelchair Standard  PCP/Specialist Visits Compliance with follow-up visit  [Encouraged Routine  Engagement with Care Team Members & Providers.]  Communication with PCP/Specialists, Charity fundraiser, Pharmacists, Social Work  [employer] Routine Engagement with Care Team Members & Providers.]  Level of Care Adult Daycare, Air traffic controller, Assisted Living, Skilled Nursing Facility  [Confirmed Disinterest in Enrollment in Adult Day Care Program. Confirmed Current Place of Residence at [facility].]  Applications Medicaid, Personal Care Services  [Confirmed Disinterest in Applying for [employer] & Personal Care Services. Confirmed Interest in Applying for Special Assistance Long-Term Care Medicaid, Mailing Application & Offering to Assist with Completion & Submission.]  Exercise Interventions   Exercise Discussed/Reviewed Exercise Discussed, Assistive device use and maintanence, Exercise Reviewed, Physical Activity, Weight Managment  [Encouraged Daily Exercise Regimen, as Tolerated.]  Physical Activity Discussed/Reviewed Physical Activity Discussed, Home Exercise Program (HEP), PREP, Physical Activity Reviewed, Gym, Types of exercise  [Encouraged Increased Level of Activity & Exercise, Inside & Outside the Home.]  Weight Management Weight maintenance  [Encouraged Healthy Diet with Supplements.]  Education Interventions   Education Provided Provided Therapist, sports, Provided Web-based Education, Provided Education  [Provided Educational Material & Encouraged Independent Review.]  Provided Verbal Education On Nutrition, Mental Health/Coping with Illness, When to see the doctor, Foot Care, Eye Care, Labs, Blood Sugar Monitoring, Medication, Development worker, community, [employer], Exercise, Holiday representative of Educational Material.]  Labs Reviewed Hgb A1c  [Reviewed & Encouraged Logging Results.]  Ship broker, Personal Care Services  [Confirmed Disinterest in Applying for [employer] & Personal Care Services. Confirmed Interest in Applying for Special Assistance Long-Term Care  Medicaid, Mailing Application & Offering to Assist with Completion & Submission.]  Mental Health Interventions   Mental Health Discussed/Reviewed Mental Health Discussed, Anxiety, Depression, Mental Health Reviewed, Grief and Loss, Substance Abuse, Coping Strategies, Suicide, Crisis, Other  [Assessed Mental Health & Cognitive Status.]  Nutrition Interventions   Nutrition  Discussed/Reviewed Nutrition Discussed, Adding fruits and vegetables, Increasing proteins, Decreasing fats, Nutrition Reviewed, Fluid intake, Carbohydrate meal planning, Portion sizes, Decreasing salt, Supplemental nutrition, Decreasing sugar intake  [Encouraged Heart-Healthy, Renal-Friendly, Low Sodium, Diabetic-Friendly, Protein-Rich Diet with Supplements.]  Pharmacy Interventions   Pharmacy Dicussed/Reviewed Pharmacy Topics Discussed, Medications and their functions, Medication Adherence, Pharmacy Topics Reviewed, Affording Medications  [Confirmed Ability to Afford Prescription Medications.]  Medication Adherence --  [Confirmed Compliance with Prescription Medications.]  Safety Interventions   Safety Discussed/Reviewed Safety Discussed, Safety Reviewed, Fall Risk, Home Safety  [Encouraged Routine Use of Assistive Devices & Durable Medical Equipment.]  Journalist, newspaper, Refer for community resources, Need for home safety assessment  [Encouraged Consideration of Home Safety Evaluation.]  Advanced Directive Interventions   Advanced Directives Discussed/Reviewed Advanced Directives Discussed, Advanced Directives Reviewed  [Confirmed Initiation of Advanced Directives (Living Will, Healthcare Power of Attorney, & Out-of-Facility DNR Documents) & Scanned into Electronic Medical Record in Epic.]      Assessed Social Determinant of Health Barriers. Discussed Plans for Ongoing Care Management Follow Up. Provided Careers information officer Information for Care Management Team Members. Screened for Signs & Symptoms of Depression, Related  to Chronic Disease State.  PHQ2 & PHQ9 Depression Screen Completed & Results Reviewed.  Suicidal Ideation & Homicidal Ideation Assessed - None Present.   Domestic Violence Assessed - None Present. Access to Weapons Assessed - None Present.   Active Listening & Reflection Utilized.  Verbalization of Feelings Encouraged.  Emotional Support Provided. Crisis Support Information, Agencies, Services, & Resources Discussed. Problem Solving Interventions Identified. Task-Centered Solutions Implemented.   Solution-Focused Strategies Developed. Acceptance & Commitment Therapy Introduced. Brief Cognitive Behavioral Therapy Initiated. Client-Centered Therapy Enacted. Reviewed Prescription Medications & Discussed Importance of Compliance. Quality of Sleep Assessed & Sleep Hygiene Techniques Promoted. Confirmed with Daughter, Glo Herring That Private Pay Status for Patient at [facility] Terminates in March of 2025. Encouraged Daughter, Glo Herring to Apply for Special Assistance Long-Term Care Medicaid, through [facility] of Social Services [phone]).  Encouraged Daughter, Glo Herring to Begin Touring Long-Term Care Assisted Living Facilities in [facility], Offering [employer], from List Provided. Encouraged Routine Engagement with Danford Bad, Licensed Clinical Social Worker with [facility], [facility] [phone]), if You Have Questions, Need Assistance, or If Additional Social Work Needs Are Identified Between Now & Our Next Follow-Up Outreach Call, Scheduled on 07/02/2023 at 2:45 PM.        Our next appointment is by telephone on 07/02/2023 at 2:45 pm.  Please call the care guide team at [phone] if you need to cancel or reschedule your appointment.   If you are experiencing a Mental Health or Behavioral Health Crisis or need someone to talk to, please call the Suicide and Crisis Lifeline:  988 call the [place] National Suicide Prevention Lifeline: 239-050-1358 or TTY: 705 681 2916 TTY (623) 428-7748) to talk to a trained counselor call 1-800-273-TALK (toll free, 24 hour hotline) go to [facility] [address], [place] [phone]) call the [facility] Crisis Line: [phone] call 911  Patient verbalizes understanding of instructions and care plan provided today and agrees to view in MyChart. Active MyChart status and patient understanding of how to access instructions and care plan via MyChart confirmed with patient.     Telephone follow up appointment with care management team member scheduled for: 07/02/2023 at 2:45 pm.   Danford Bad, BSW, MSW, LCSW [place]  [facility], [facility] Health Clinical  Social Worker II Direct Dial: [phone]  Fax: [phone] Website: [email or web address]

## 2023-06-21 NOTE — Patient Outreach (Signed)
Care Coordination   Initial Visit Note   06/21/2023  Name: Kristy Jarvis MRN: [id number] DOB: 1920-10-06  Kristy Jarvis is a 89 y.o. year old female who sees Kerri Perches, MD for primary care. I spoke with patient's daughter, Kristy Jarvis by phone today.  What matters to the patients health and wellness today?  Receive Assistance Pursuing Long-Term Care Placement Arrangements.     Goals Addressed             This Visit's Progress    Receive Assistance Pursuing Long-Term Care Placement Arrangements.   On track    Care Coordination Interventions:  Interventions Today    Flowsheet Row Most Recent Value  Chronic Disease   Chronic disease during today's visit Hypertension (HTN), Chronic Kidney Disease/End Stage Renal Disease (ESRD), Other  [Mild Cognitive Impairment, Daughter Requesting Assistance Relocating Patient to Long-Term Care Medicaid Bed Once Private Pay Status Terminates at [facility] in March of 2025.]  General Interventions   General Interventions Discussed/Reviewed General Interventions Discussed, Labs, Annual Foot Exam, Lipid Profile, General Interventions Reviewed, Durable Medical Equipment (DME), Annual Eye Exam, Vaccines, Community Resources, Level of Care, Communication with, Health Screening, Doctor Visits, Referral to Nurse  [Encouraged Routine Engagement with Care Team Members & Providers.]  Labs Hgb A1c every 3 months, Kidney Function, Hgb A1c annually  [Encouraged Routine Lab Work.]  Vaccines Flu, Pneumonia, RSV, COVID-19, Shingles, Tetanus/Pertussis/Diphtheria  [Encouraged Routine Vaccinations.]  Doctor Visits Discussed/Reviewed Doctor Visits Discussed, Specialist, Doctor Visits Reviewed, Annual Wellness Visits, PCP  [Encouraged Routine Engagement with Care Team Members & Providers.]  Health Screening Bone Density, Colonoscopy, Mammogram  [Encouraged Routine Health Screenings.]  Durable Medical Equipment (DME) Bed side  commode, BP Cuff, Environmental consultant, Wheelchair, Tour manager, Other  [Prescription Eyeglasses, Medical laboratory scientific officer, Sports administrator, Psychologist, prison and probation services Hose.]  Wheelchair Standard  PCP/Specialist Visits Compliance with follow-up visit  [Encouraged Routine Engagement with Care Team Members & Providers.]  Communication with PCP/Specialists, Charity fundraiser, Pharmacists, Social Work  [employer] Routine Engagement with Care Team Members & Providers.]  Level of Care Adult Daycare, Air traffic controller, Assisted Living, Skilled Nursing Facility  [Confirmed Disinterest in Enrollment in Adult Day Care Program. Confirmed Current Place of Residence at [facility].]  Applications Medicaid, Personal Care Services  [Confirmed Disinterest in Applying for [employer] & Personal Care Services. Confirmed Interest in Applying for Special Assistance Long-Term Care Medicaid, Mailing Application & Offering to Assist with Completion & Submission.]  Exercise Interventions   Exercise Discussed/Reviewed Exercise Discussed, Assistive device use and maintanence, Exercise Reviewed, Physical Activity, Weight Managment  [Encouraged Daily Exercise Regimen, as Tolerated.]  Physical Activity Discussed/Reviewed Physical Activity Discussed, Home Exercise Program (HEP), PREP, Physical Activity Reviewed, Gym, Types of exercise  [Encouraged Increased Level of Activity & Exercise, Inside & Outside the Home.]  Weight Management Weight maintenance  [Encouraged Healthy Diet with Supplements.]  Education Interventions   Education Provided Provided Therapist, sports, Provided Web-based Education, Provided Education  [Provided Educational Material & Encouraged Independent Review.]  Provided Verbal Education On Nutrition, Mental Health/Coping with Illness, When to see the doctor, Foot Care, Eye Care, Labs, Blood Sugar Monitoring, Medication, Development worker, community, [employer], Exercise, Holiday representative of Educational Material.]  Labs Reviewed Hgb A1c  [Reviewed &  Encouraged Logging Results.]  Ship broker, Personal Care Services  [Confirmed Disinterest in Applying for [employer] & Personal Care Services. Confirmed Interest in Applying for Special Assistance Long-Term Care Medicaid, Mailing Application & Offering to Assist with Completion & Submission.]  Mental Health Interventions   Mental  Health Discussed/Reviewed Mental Health Discussed, Anxiety, Depression, Mental Health Reviewed, Grief and Loss, Substance Abuse, Coping Strategies, Suicide, Crisis, Other  [Assessed Mental Health & Cognitive Status.]  Nutrition Interventions   Nutrition Discussed/Reviewed Nutrition Discussed, Adding fruits and vegetables, Increasing proteins, Decreasing fats, Nutrition Reviewed, Fluid intake, Carbohydrate meal planning, Portion sizes, Decreasing salt, Supplemental nutrition, Decreasing sugar intake  [Encouraged Heart-Healthy, Renal-Friendly, Low Sodium, Diabetic-Friendly, Protein-Rich Diet with Supplements.]  Pharmacy Interventions   Pharmacy Dicussed/Reviewed Pharmacy Topics Discussed, Medications and their functions, Medication Adherence, Pharmacy Topics Reviewed, Affording Medications  [Confirmed Ability to Afford Prescription Medications.]  Medication Adherence --  [Confirmed Compliance with Prescription Medications.]  Safety Interventions   Safety Discussed/Reviewed Safety Discussed, Safety Reviewed, Fall Risk, Home Safety  [Encouraged Routine Use of Assistive Devices & Durable Medical Equipment.]  Journalist, newspaper, Refer for community resources, Need for home safety assessment  [Encouraged Consideration of Home Safety Evaluation.]  Advanced Directive Interventions   Advanced Directives Discussed/Reviewed Advanced Directives Discussed, Advanced Directives Reviewed  [Confirmed Initiation of Advanced Directives (Living Will, Healthcare Power of Attorney, & Out-of-Facility DNR Documents) & Scanned into Electronic Medical Record in Epic.]      Assessed  Social Determinant of Health Barriers. Discussed Plans for Ongoing Care Management Follow Up. Provided Careers information officer Information for Care Management Team Members. Screened for Signs & Symptoms of Depression, Related to Chronic Disease State.  PHQ2 & PHQ9 Depression Screen Completed & Results Reviewed.  Suicidal Ideation & Homicidal Ideation Assessed - None Present.   Domestic Violence Assessed - None Present. Access to Weapons Assessed - None Present.   Active Listening & Reflection Utilized.  Verbalization of Feelings Encouraged.  Emotional Support Provided. Crisis Support Information, Agencies, Services, & Resources Discussed. Problem Solving Interventions Identified. Task-Centered Solutions Implemented.   Solution-Focused Strategies Developed. Acceptance & Commitment Therapy Introduced. Brief Cognitive Behavioral Therapy Initiated. Client-Centered Therapy Enacted. Reviewed Prescription Medications & Discussed Importance of Compliance. Quality of Sleep Assessed & Sleep Hygiene Techniques Promoted. Confirmed with Daughter, Kristy Jarvis That Private Pay Status for Patient at [facility] Terminates in March of 2025. Encouraged Daughter, Kristy Jarvis to Apply for Special Assistance Long-Term Care Medicaid, through [facility] of Social Services [phone]).  Encouraged Daughter, Kristy Jarvis to Begin Touring Long-Term Care Assisted Living Facilities in [facility], Offering [employer], from List Provided. Encouraged Routine Engagement with Danford Bad, Licensed Clinical Social Worker with [facility], [facility] [phone]), if You Have Questions, Need Assistance, or If Additional Social Work Needs Are Identified Between Now & Our Next Follow-Up Outreach Call, Scheduled on 07/02/2023 at 2:45 PM.          SDOH assessments and interventions completed:  Yes.  SDOH Interventions Today     Flowsheet Row Most Recent Value  SDOH Interventions   Food Insecurity Interventions Intervention Not Indicated  Housing Interventions Intervention Not Indicated  Transportation Interventions Intervention Not Indicated, Community Resources Provided, Patient Resources (Friends/Family)  Utilities Interventions Intervention Not Indicated  Alcohol Usage Interventions Intervention Not Indicated (Score <7)  Financial Strain Interventions Intervention Not Indicated  Physical Activity Interventions Intervention Not Indicated  Stress Interventions Intervention Not Indicated  Social Connections Interventions Intervention Not Indicated, Community Resources Provided  Health Literacy Interventions Intervention Not Indicated     Care Coordination Interventions:  Yes, provided.   Follow up plan: Follow up call scheduled for 07/02/2023 at 2:45 pm.  Encounter Outcome:  Patient Visit Completed.    Danford Bad, BSW, MSW, [employer]  Health  [facility], [facility] Clinical Social Worker II Direct Dial: [phone]  Fax: [phone] Website: [email or web address]

## 2023-06-22 ENCOUNTER — Ambulatory Visit: Payer: Self-pay | Admitting: *Deleted

## 2023-06-22 NOTE — Patient Instructions (Addendum)
Visit Information  Thank you for taking time to visit with me today. Please don't hesitate to contact me if I can be of assistance to you.   Following are the goals we discussed today:   Goals Addressed             This Visit's Progress    will receive community resources to help patient with a new level of care facility- [facility] RN CM   On track    Daughter to assist with medicaid application, follow up with pcp on FL2/personal care services   Interventions Today    Flowsheet Row Most Recent Value  Chronic Disease   Chronic disease during today's visit Other  [Follow up level of care assistance]  General Interventions   General Interventions Discussed/Reviewed General Interventions Reviewed, [employer], Doctor Visits, Communication with, Level of Care  [assessed for other RN CM needs with daughter after [employer] SW outreach completed. RN CM encouraged daughter to start patient's medicaid application online/provide the needed items & expect DSS to outreach to her. Encouraged to visit DSS as needed.]  Doctor Visits Discussed/Reviewed Doctor Visits Reviewed, PCP  [Discussed RN CM future request for [facility] assistance from pcp]  PCP/Specialist Visits --  [encouraged daughter to outreach/follow up with pcp on assistance as Facility CM/SW suggested/ clinical pool note for FL2 assist]  Communication with PCP/Specialists  Level of Care Applications, Assisted Living  Applications FL-2, Medicaid  [discussed the FL2 purpose and importance of starting the medicaid application for patient]  Education Interventions   Education Provided Provided Education  [FL2 purpose, medicaid application]  Provided Verbal Education On Other, [employer], When to see the doctor  [see pcp about FL2, CM/SW facility assist, medicaid for placement]  Applications FL-2, Medicaid  [discussed the FL2 purpose and importance of starting the medicaid application for patient]  Mental Health Interventions   Mental  Health Discussed/Reviewed Mental Health Reviewed, Coping Strategies  [caregiver coping]  Advanced Directive Interventions   Advanced Directives Discussed/Reviewed Advanced Directives Reviewed  [confirmed items in EPIC]              Our next appointment is by telephone on 07/13/23 at 1130  Please call the care guide team at [phone] if you need to cancel or reschedule your appointment.   If you are experiencing a Mental Health or Behavioral Health Crisis or need someone to talk to, please call the Suicide and Crisis Lifeline: 988 call the [place] National Suicide Prevention Lifeline: 612-606-4641 or TTY: 909-108-9911 TTY (347)059-5563) to talk to a trained counselor call 1-800-273-TALK (toll free, 24 hour hotline) call the [facility]: 240-276-9955 call 911   Patient verbalizes understanding of instructions and care plan provided today and agrees to view in MyChart. Active MyChart status and patient understanding of how to access instructions and care plan via MyChart confirmed with patient.     The patient has been provided with contact information for the care management team and has been advised to call with any health related questions or concerns.   Lillis Nuttle L. Noelle Penner, RN, BSN, CCM [place]  [facility], [facility] RN Care Manager Direct Dial: [phone]  Fax: [phone] Mailing Address: [address]  [place] [address] Website: [email or web address]

## 2023-06-22 NOTE — Patient Outreach (Addendum)
  Care Coordination   Follow Up Visit Note   06/22/2023 Name: Kristy Jarvis MRN: [id number] DOB: 12-26-1920  Kristy Jarvis is a 89 y.o. year old female who sees Kristy Perches, MD for primary care. I spoke with daughter  Kristy Jarvis by phone today.  What matters to the patients health and wellness today?  Relocating to long term care facility from assisted Informed by daughter [facility] SW outreach completed & further assistance from [facility] CM/SW encouraged.  Daughter to complete tasks. Daughter report being encouraged by Facility CM/SW to get forms from pcp. Daughter will follow up with medicaid application on line  She states [facility] requesting a FL2 from pcp   Voiced understanding of tasks to complete   Goals Addressed             This Visit's Progress    will receive community resources to help patient with a new level of care facility- [facility]BCI RN CM   On track    Daughter to assist with medicaid application, follow up with pcp on FL2/personal care services   Interventions Today    Flowsheet Row Most Recent Value  Chronic Disease   Chronic disease during today's visit Other  [Follow up level of care assistance]  General Interventions   General Interventions Discussed/Reviewed General Interventions Reviewed, [employer], Doctor Visits, Communication with, Level of Care  [assessed for other RN CM needs with daughter after [employer] SW outreach completed. RN CM encouraged daughter to start patient's medicaid application online/provide the needed items & expect DSS to outreach to her. Encouraged to visit DSS as needed.]  Doctor Visits Discussed/Reviewed Doctor Visits Reviewed, PCP  [Discussed RN CM future request for [facility] assistance from pcp]  PCP/Specialist Visits --  [encouraged daughter to outreach/follow up with pcp on assistance as Facility CM/SW suggested/ clinical pool note for FL2 assist]  Communication with PCP/Specialists  Level of Care Applications,  Assisted Living  Applications FL-2, Medicaid  [discussed the FL2 purpose and importance of starting the medicaid application for patient]  Education Interventions   Education Provided Provided Education  [FL2 purpose, medicaid application]  Provided Verbal Education On Other, [employer], When to see the doctor  [see pcp about FL2, CM/SW facility assist, medicaid for placement]  Applications FL-2, Medicaid  [discussed the FL2 purpose and importance of starting the medicaid application for patient]  Mental Health Interventions   Mental Health Discussed/Reviewed Mental Health Reviewed, Coping Strategies  [caregiver coping]  Advanced Directive Interventions   Advanced Directives Discussed/Reviewed Advanced Directives Reviewed  [confirmed items in EPIC]              SDOH assessments and interventions completed:  No     Care Coordination Interventions:  Yes, provided   Follow up plan: Follow up call scheduled for in 07/13/23    Encounter Outcome:  Patient Visit Completed   Kristy Bradford L. Noelle Penner, RN, BSN, CCM [place]  [facility], [facility] RN Care Manager Direct Dial: [phone]  Fax: [phone] Mailing Address: [address]  [place] [address] Website: [email or web address]

## 2023-06-22 NOTE — Patient Instructions (Addendum)
Visit Information  Thank you for taking time to visit with me today. Please don't hesitate to contact me if I can be of assistance to you.   Following are the goals we discussed today:   Goals Addressed             This Visit's Progress    will receive community resources to help patient with a new level of care facility- [facility]BCI RN CM   On track    Daughter to assist with medicaid application, follow up with pcp on FL2/personal care services   Interventions Today    Flowsheet Row Most Recent Value  Chronic Disease   Chronic disease during today's visit --  [new facility needs, assessed for outreach from referral staff. Confirmed an outreach received & has pending outreach on 06/20/23]  General Interventions   General Interventions Discussed/Reviewed General Interventions Reviewed, Community Resources, Level of Care  Level of Care Assisted Living  Mental Health Interventions   Mental Health Discussed/Reviewed Mental Health Discussed, Coping Strategies  [caregiver is managing & pending further outreach]  Nutrition Interventions   Nutrition Discussed/Reviewed Nutrition Discussed  [taken care of at [facility]]  Pharmacy Interventions   Pharmacy Dicussed/Reviewed Pharmacy Topics Discussed  [taken care of at [facility]]  Safety Interventions   Safety Discussed/Reviewed Safety Discussed  [taken care of at [facility]]              Our next appointment is by telephone on 06/22/23 at 315 pm  Please call the care guide team at [phone] if you need to cancel or reschedule your appointment.   If you are experiencing a Mental Health or Behavioral Health Crisis or need someone to talk to, please call the Suicide and Crisis Lifeline: 988 call the [place] National Suicide Prevention Lifeline: 312-588-1761 or TTY: 220 373 9592 TTY (270) 021-6445) to talk to a trained counselor call 1-800-273-TALK (toll free, 24 hour hotline) call the [facility]: 289-393-9997 call 911    Patient verbalizes understanding of instructions and care plan provided today and agrees to view in MyChart. Active MyChart status and patient understanding of how to access instructions and care plan via MyChart confirmed with patient.     The patient has been provided with contact information for the care management team and has been advised to call with any health related questions or concerns.   Collis Thede L. Noelle Penner, RN, BSN, CCM   [facility], [facility] Health RN Care Manager Direct Dial: [phone]  Fax: [phone] Mailing Address: [address]  [place] [address] Website: [email or web address]

## 2023-06-27 ENCOUNTER — Telehealth: Payer: Self-pay

## 2023-06-27 NOTE — Telephone Encounter (Signed)
 Copied from CRM [id number]. Topic: Clinical - Home Health Verbal Orders >> Jun 26, 2023  2:43 PM Ivette P wrote: Caller/Agency: Rosaline Rushing Number: [phone]   [facility] sent over a FL2 through fax and has not received a response would like a callback

## 2023-07-02 ENCOUNTER — Ambulatory Visit: Payer: Self-pay | Admitting: *Deleted

## 2023-07-02 NOTE — Patient Outreach (Signed)
 Care Coordination   Follow Up Visit Note   07/02/2023  Name: Kristy Jarvis MRN: [id number] DOB: 1920-06-16  Kristy Jarvis is Kristy 89 y.o. year old female who sees Towanda Fret, MD for primary care. I spoke with daughter, Anita Jarvis by phone today.  What matters to the patients health and wellness today?  Receive Assistance Pursuing Long-Term Care Placement Arrangements.      Goals Addressed             This Visit's Progress    Receive Assistance Pursuing Long-Term Care Placement Arrangements.   On track    Care Coordination Interventions:  Interventions Today    Flowsheet Row Most Recent Value  Chronic Disease   Chronic disease during today's visit Hypertension (HTN), Chronic Kidney Disease/End Stage Renal Disease (ESRD), Other  [Mild Cognitive Impairment, Daughter Requesting Assistance Relocating Patient to Long-Term Care Medicaid Bed Once Private Pay Status Terminates at [facility] in March of 2025.]  General Interventions   General Interventions Discussed/Reviewed General Interventions Discussed, Labs, Annual Foot Exam, Lipid Profile, General Interventions Reviewed, Durable Medical Equipment (DME), Annual Eye Exam, Vaccines, Community Resources, Level of Care, Communication with, Health Screening, Doctor Visits, Referral to Nurse  [Encouraged Routine Engagement with Care Team Members & Providers.]  Labs Hgb A1c every 3 months, Kidney Function, Hgb A1c annually  [Encouraged Routine Lab Work.]  Vaccines Flu, Pneumonia, RSV, COVID-19, Shingles, Tetanus/Pertussis/Diphtheria  [Encouraged Routine Vaccinations.]  Doctor Visits Discussed/Reviewed Doctor Visits Discussed, Specialist, Doctor Visits Reviewed, Annual Wellness Visits, PCP  [Encouraged Routine Engagement with Care Team Members & Providers.]  Health Screening Bone Density, Colonoscopy, Mammogram  [Encouraged Routine Health Screenings.]  Durable Medical Equipment (DME) Bed side commode,  BP Cuff, Environmental consultant, Wheelchair, Tour manager, Other  [Prescription Eyeglasses, Medical laboratory scientific officer, Sports administrator, Psychologist, prison and probation services Hose.]  Wheelchair Standard  PCP/Specialist Visits Compliance with follow-up visit  [Encouraged Routine Engagement with Care Team Members & Providers.]  Communication with PCP/Specialists, Charity fundraiser, Pharmacists, Social Work  [employer] Routine Engagement with Care Team Members & Providers.]  Level of Care Adult Daycare, Air traffic controller, Assisted Living, Skilled Nursing Facility  [Confirmed Disinterest in Enrollment in Adult Day Care Program. Confirmed Current Place of Residence at [facility].]  Applications Medicaid, Personal Care Services  [Confirmed Disinterest in Applying for [employer] & Personal Care Services. Confirmed Interest in Applying for Special Assistance Long-Term Care Medicaid, Mailing Application & Offering to Assist with Completion & Submission.]  Exercise Interventions   Exercise Discussed/Reviewed Exercise Discussed, Assistive device use and maintanence, Exercise Reviewed, Physical Activity, Weight Managment  [Encouraged Daily Exercise Regimen, as Tolerated.]  Physical Activity Discussed/Reviewed Physical Activity Discussed, Home Exercise Program (HEP), PREP, Physical Activity Reviewed, Gym, Types of exercise  [Encouraged Increased Level of Activity & Exercise, Inside & Outside the Home.]  Weight Management Weight maintenance  [Encouraged Healthy Diet with Supplements.]  Education Interventions   Education Provided Provided Therapist, sports, Provided Web-based Education, Provided Education  [Provided Educational Material & Encouraged Independent Review.]  Provided Verbal Education On Nutrition, Mental Health/Coping with Illness, When to see the doctor, Foot Care, Eye Care, Labs, Blood Sugar Monitoring, Medication, Development worker, community, [employer], Exercise, Holiday representative of Educational Material.]  Labs Reviewed Hgb A1c  [Reviewed & Encouraged  Logging Results.]  Ship broker, Personal Care Services  [Confirmed Disinterest in Applying for [employer] & Personal Care Services. Confirmed Interest in Applying for Special Assistance Long-Term Care Medicaid, Mailing Application & Offering to Assist with Completion & Submission.]  Mental Health Interventions  Mental Health Discussed/Reviewed Mental Health Discussed, Anxiety, Depression, Mental Health Reviewed, Grief and Loss, Substance Abuse, Coping Strategies, Suicide, Crisis, Other  [Assessed Mental Health & Cognitive Status.]  Nutrition Interventions   Nutrition Discussed/Reviewed Nutrition Discussed, Adding fruits and vegetables, Increasing proteins, Decreasing fats, Nutrition Reviewed, Fluid intake, Carbohydrate meal planning, Portion sizes, Decreasing salt, Supplemental nutrition, Decreasing sugar intake  [Encouraged Heart-Healthy, Renal-Friendly, Low Sodium, Diabetic-Friendly, Protein-Rich Diet with Supplements.]  Pharmacy Interventions   Pharmacy Dicussed/Reviewed Pharmacy Topics Discussed, Medications and their functions, Medication Adherence, Pharmacy Topics Reviewed, Affording Medications  [Confirmed Ability to Afford Prescription Medications.]  Medication Adherence --  [Confirmed Compliance with Prescription Medications.]  Safety Interventions   Safety Discussed/Reviewed Safety Discussed, Safety Reviewed, Fall Risk, Home Safety  [Encouraged Routine Use of Assistive Devices & Durable Medical Equipment.]  Journalist, newspaper, Refer for community resources, Need for home safety assessment  [Encouraged Consideration of Home Safety Evaluation.]  Advanced Directive Interventions   Advanced Directives Discussed/Reviewed Advanced Directives Discussed, Advanced Directives Reviewed  [Confirmed Initiation of Advanced Directives (Living Will, Healthcare Power of Attorney, & Out-of-Facility DNR Documents) & Scanned into Electronic Medical Record in Epic.]      Active Listening &  Reflection Utilized.  Verbalization of Feelings Encouraged.  Emotional Support Provided. Problem Solving Interventions Activated. Task-Centered Solutions Employed.   Solution-Focused Strategies Indicated. Acceptance & Commitment Therapy Implemented. Cognitive Behavioral Therapy Performed. Client-Centered Therapy Initiated. Confirmed Daughter, Normand Beckwith Applied for Special Assistance Long-Term Care Medicaid, through [facility] [phone]).   ~ Application Submitted on 06/28/2023 for Processing. Confirmed Daughter, Conchetta Deeds Interest in Patient Receiving Long-Term Care Placement at [facility] [phone]). CSW Collaboration with Admissions Coordinator at [facility] [phone]) to Check Status of Bed Availability.    ~ HIPAA Compliant Message Left on Voicemail. Encouraged Routine Engagement with Anjeanette Petzold, Licensed Clinical Social Worker with [facility], [facility] [phone]), if You Have Questions, Need Assistance, or If Additional Social Work Needs Are Identified Between Now & Our Next Follow-Up Outreach Call, Scheduled on 08/01/2023 at 4:30 PM.        SDOH assessments and interventions completed:  Yes.  Care Coordination Interventions:  Yes, provided.   Follow up plan: Follow up call scheduled for 08/01/2023 at 4:30 pm.  Encounter Outcome:  Patient Visit Completed.    Timmie Footman, BSW, MSW, LCSW [facility], [facility] Clinical Social Worker II Direct Dial: [phone]  Fax: [phone] Website: [email or web address]

## 2023-07-02 NOTE — Patient Instructions (Signed)
 Visit Information  Thank you for taking time to visit with me today. Please don't hesitate to contact me if I can be of assistance to you.   Following are the goals we discussed today:   Goals Addressed             This Visit's Progress    Receive Assistance Pursuing Long-Term Care Placement Arrangements.   On track    Care Coordination Interventions:  Interventions Today    Flowsheet Row Most Recent Value  Chronic Disease   Chronic disease during today's visit Hypertension (HTN), Chronic Kidney Disease/End Stage Renal Disease (ESRD), Other  [Mild Cognitive Impairment, Daughter Requesting Assistance Relocating Patient to Long-Term Care Medicaid Bed Once Private Pay Status Terminates at [facility] in March of 2025.]  General Interventions   General Interventions Discussed/Reviewed General Interventions Discussed, Labs, Annual Foot Exam, Lipid Profile, General Interventions Reviewed, Durable Medical Equipment (DME), Annual Eye Exam, Vaccines, Community Resources, Level of Care, Communication with, Health Screening, Doctor Visits, Referral to Nurse  [Encouraged Routine Engagement with Care Team Members & Providers.]  Labs Hgb A1c every 3 months, Kidney Function, Hgb A1c annually  [Encouraged Routine Lab Work.]  Vaccines Flu, Pneumonia, RSV, COVID-19, Shingles, Tetanus/Pertussis/Diphtheria  [Encouraged Routine Vaccinations.]  Doctor Visits Discussed/Reviewed Doctor Visits Discussed, Specialist, Doctor Visits Reviewed, Annual Wellness Visits, PCP  [Encouraged Routine Engagement with Care Team Members & Providers.]  Health Screening Bone Density, Colonoscopy, Mammogram  [Encouraged Routine Health Screenings.]  Durable Medical Equipment (DME) Bed side commode, BP Cuff, Environmental consultant, Wheelchair, Tour manager, Other  [Prescription Eyeglasses, Medical laboratory scientific officer, Sports administrator, Psychologist, prison and probation services Hose.]  Wheelchair Standard  PCP/Specialist Visits Compliance with follow-up visit  [Encouraged Routine  Engagement with Care Team Members & Providers.]  Communication with PCP/Specialists, Charity fundraiser, Pharmacists, Social Work  [employer] Routine Engagement with Care Team Members & Providers.]  Level of Care Adult Daycare, Air traffic controller, Assisted Living, Skilled Nursing Facility  [Confirmed Disinterest in Enrollment in Adult Day Care Program. Confirmed Current Place of Residence at [facility].]  Applications Medicaid, Personal Care Services  [Confirmed Disinterest in Applying for [employer] & Personal Care Services. Confirmed Interest in Applying for Special Assistance Long-Term Care Medicaid, Mailing Application & Offering to Assist with Completion & Submission.]  Exercise Interventions   Exercise Discussed/Reviewed Exercise Discussed, Assistive device use and maintanence, Exercise Reviewed, Physical Activity, Weight Managment  [Encouraged Daily Exercise Regimen, as Tolerated.]  Physical Activity Discussed/Reviewed Physical Activity Discussed, Home Exercise Program (HEP), PREP, Physical Activity Reviewed, Gym, Types of exercise  [Encouraged Increased Level of Activity & Exercise, Inside & Outside the Home.]  Weight Management Weight maintenance  [Encouraged Healthy Diet with Supplements.]  Education Interventions   Education Provided Provided Therapist, sports, Provided Web-based Education, Provided Education  [Provided Educational Material & Encouraged Independent Review.]  Provided Verbal Education On Nutrition, Mental Health/Coping with Illness, When to see the doctor, Foot Care, Eye Care, Labs, Blood Sugar Monitoring, Medication, Development worker, community, [employer], Exercise, Holiday representative of Educational Material.]  Labs Reviewed Hgb A1c  [Reviewed & Encouraged Logging Results.]  Ship broker, Personal Care Services  [Confirmed Disinterest in Applying for [employer] & Personal Care Services. Confirmed Interest in Applying for Special Assistance Long-Term Care  Medicaid, Mailing Application & Offering to Assist with Completion & Submission.]  Mental Health Interventions   Mental Health Discussed/Reviewed Mental Health Discussed, Anxiety, Depression, Mental Health Reviewed, Grief and Loss, Substance Abuse, Coping Strategies, Suicide, Crisis, Other  [Assessed Mental Health & Cognitive Status.]  Nutrition Interventions   Nutrition  Discussed/Reviewed Nutrition Discussed, Adding fruits and vegetables, Increasing proteins, Decreasing fats, Nutrition Reviewed, Fluid intake, Carbohydrate meal planning, Portion sizes, Decreasing salt, Supplemental nutrition, Decreasing sugar intake  [Encouraged Heart-Healthy, Renal-Friendly, Low Sodium, Diabetic-Friendly, Protein-Rich Diet with Supplements.]  Pharmacy Interventions   Pharmacy Dicussed/Reviewed Pharmacy Topics Discussed, Medications and their functions, Medication Adherence, Pharmacy Topics Reviewed, Affording Medications  [Confirmed Ability to Afford Prescription Medications.]  Medication Adherence --  [Confirmed Compliance with Prescription Medications.]  Safety Interventions   Safety Discussed/Reviewed Safety Discussed, Safety Reviewed, Fall Risk, Home Safety  [Encouraged Routine Use of Assistive Devices & Durable Medical Equipment.]  Journalist, newspaper, Refer for community resources, Need for home safety assessment  [Encouraged Consideration of Home Safety Evaluation.]  Advanced Directive Interventions   Advanced Directives Discussed/Reviewed Advanced Directives Discussed, Advanced Directives Reviewed  [Confirmed Initiation of Advanced Directives (Living Will, Healthcare Power of Attorney, & Out-of-Facility DNR Documents) & Scanned into Electronic Medical Record in Epic.]      Active Listening & Reflection Utilized.  Verbalization of Feelings Encouraged.  Emotional Support Provided. Problem Solving Interventions Activated. Task-Centered Solutions Employed.   Solution-Focused Strategies  Indicated. Acceptance & Commitment Therapy Implemented. Cognitive Behavioral Therapy Performed. Client-Centered Therapy Initiated. Confirmed Daughter, Normand Beckwith Applied for Special Assistance Long-Term Care Medicaid, through [facility] Social Services [phone]).   ~ Application Submitted on 06/28/2023 for Processing. Confirmed Daughter, Conchetta Deeds Interest in Patient Receiving Long-Term Care Placement at [facility] [phone]). CSW Collaboration with Admissions Coordinator at [facility] [phone]) to Check Status of Bed Availability.    ~ HIPAA Compliant Message Left on Voicemail. Encouraged Routine Engagement with Timmie Footman, Licensed Clinical Social Worker with [facility] [phone]), if You Have Questions, Need Assistance, or If Additional Social Work Needs Are Identified Between Now & Our Next Follow-Up Outreach Call, Scheduled on 08/01/2023 at 4:30 PM.        Our next appointment is by telephone on 08/01/2023 at 4:30 pm.  Please call the care guide team at [phone] if you need to cancel or reschedule your appointment.   If you are experiencing a Mental Health or Behavioral Health Crisis or need someone to talk to, please call the Suicide and Crisis Lifeline: 988 call the [place]  National Suicide Prevention Lifeline: (616)018-9556 or TTY: 778-266-3092 TTY 9198495212) to talk to a trained counselor call 1-800-273-TALK (toll free, 24 hour hotline) go to [facility] [address] [place] [phone]) call the [facility] Crisis Line: [phone] call 911  Patient verbalizes understanding of instructions and care plan provided today and agrees to view in MyChart. Active MyChart status and patient understanding of how to access instructions and care plan via MyChart confirmed  with patient.     Telephone follow up appointment with care management team member scheduled for:   08/01/2023 at 4:30 pm.   Timmie Footman, BSW, MSW, LCSW [place]  [facility], [facility] Clinical Social Worker II Direct Dial: [phone]  Fax: [phone] Website: [email or web address]

## 2023-07-05 NOTE — Telephone Encounter (Signed)
Called and left msg for Kristy Jarvis requesting she re-fax over the FL2 form attn to me

## 2023-07-12 ENCOUNTER — Encounter: Payer: Self-pay | Admitting: Family Medicine

## 2023-07-12 DIAGNOSIS — Z59819 Housing instability, housed unspecified: Secondary | ICD-10-CM | POA: Insufficient documentation

## 2023-07-12 DIAGNOSIS — F03C0 Unspecified dementia, severe, without behavioral disturbance, psychotic disturbance, mood disturbance, and anxiety: Secondary | ICD-10-CM | POA: Insufficient documentation

## 2023-07-12 NOTE — Assessment & Plan Note (Signed)
 stable

## 2023-07-12 NOTE — Assessment & Plan Note (Signed)
Refer to ssocial work to work with her daughter,

## 2023-07-12 NOTE — Progress Notes (Signed)
   Kristy Jarvis     MRN: [id number]      DOB: Feb 10, 1921  Chief Complaint  Patient presents with   Follow-up    Follow up     HPI Kristy Jarvis is here for follow up and re-evaluation of chronic medical conditions, medication management and review of any available recent lab and radiology data.  Preventive health is updated, specifically  Cancer screening and Immunization.   Questions or concerns regarding consultations or procedures which the PT has had in the interim are  addressed. The PT denies any adverse reactions to current medications since the last visit.  Daughter reports episdoes of anxiety where she has excessive trembling and shaking and wants to know if medication  can be prescribed for this Concern also expressed regarding future housing as she is running out of funds to continue to live where she is in a facility which houses patients with dementia. Daughter is extremely concerned and anxious about this, case management involvement needed ROS Denies recent fever or chills. Denies sinus pressure, nasal congestion, ear pain or sore throat. Denies chest congestion, productive cough or wheezing. Denies chest pains, palpitations and leg swelling Denies abdominal pain, nausea, vomiting,diarrhea or constipation.   Denies dysuria, frequency, hesitancy or incontinence. Chronic limitation in mobility. Denies headaches, seizures, numbness, or tingling. Denies skin break down or rash.   PE  BP 138/68 (BP Location: Left Arm, Patient Position: Sitting, Cuff Size: Normal)   Pulse 83   Ht 4\' 10"  (1.473 m)   Wt 88 lb (39.9 kg)   SpO2 92%   BMI 18.39 kg/m   Patient alert and in no cardiopulmonary distress.  HEENT: No facial asymmetry, EOMI,     Neck decreased ROM .  Chest: Clear to auscultation bilaterally.  CVS: S1, S2 no murmurs, no S3.Regular rate.  ABD: Soft non tender.   Ext: No edema  MS: decreased ROM spine, shoulders, hips and knees.  Skin: Intact, no  ulcerations or rash noted.  Psych: Good eye contact, normal affect. Memory ilosst not anxious or depressed appearing.  CNS: CN 2-12 intact, power,  normal throughout.no focal deficits noted.   Assessment & Plan  Primary hypertension Controlled, no change in medication   Severe dementia without behavioral disturbance, psychotic disturbance, mood disturbance, or anxiety (HCC) Progressive decline in function noted, needs assistance with ADL's including feeding and baths. Needs 24 hour supervision, will refer t s/w re current health condition and need s  Housing insecurity Refer to ssocial work to work with her daughter,  CKD (chronic kidney disease) stage 4, GFR 15-29 ml/min (HCC) stable

## 2023-07-12 NOTE — Assessment & Plan Note (Signed)
Progressive decline in function noted, needs assistance with ADL's including feeding and baths. Needs 24 hour supervision, will refer t s/w re current health condition and need s

## 2023-07-12 NOTE — Assessment & Plan Note (Signed)
 Controlled, no change in medication

## 2023-07-13 ENCOUNTER — Ambulatory Visit: Payer: Self-pay | Admitting: *Deleted

## 2023-07-13 NOTE — Patient Outreach (Signed)
Care Coordination   07/13/2023 Name: Kristy Jarvis MRN: [id number] DOB: 02/24/1921   Care Coordination Outreach Attempts:  An unsuccessful outreach was attempted for an appointment today.  Follow Up Plan:  Additional outreach attempts will be made to offer the patient complex care management information and services.   Encounter Outcome:  No Answer   Care Coordination Interventions:  No, not indicated     Kennedy Bohanon L. Noelle Penner, RN, BSN, CCM [place]  [facility], [facility] RN Care Manager Direct Dial: [phone]  Fax: [phone] Mailing Address: [address]  [place] [address] Website: [email or web address]

## 2023-07-24 ENCOUNTER — Ambulatory Visit: Payer: Self-pay | Admitting: *Deleted

## 2023-07-24 NOTE — Patient Outreach (Signed)
 Care Coordination   Follow Up Visit Note   07/24/2023  Name: Kristy Jarvis MRN: [id number] DOB: [birth year]  Kristy Jarvis is a 89 y.o. year old female who sees Kerri Perches, MD for primary care. I spoke with patient's daughter, Glo Herring by phone today.  What matters to the patients health and wellness today?  Receive Assistance Pursuing Long-Term Care Placement Arrangements.    Goals Addressed             This Visit's Progress    COMPLETED: Receive Assistance Pursuing Long-Term Care Placement Arrangements.   On track    Care Coordination Interventions:  Interventions Today    Flowsheet Row Most Recent Value  Chronic Disease   Chronic disease during today's visit Hypertension (HTN), Chronic Kidney Disease/End Stage Renal Disease (ESRD), Other  [Mild Cognitive Impairment, Daughter Requesting Assistance Relocating Patient to Long-Term Care Medicaid Bed Once Private Pay Status Terminates at [facility] in March of 2025.]  General Interventions   General Interventions Discussed/Reviewed General Interventions Discussed, Labs, Annual Foot Exam, Lipid Profile, General Interventions Reviewed, Durable Medical Equipment (DME), Annual Eye Exam, Vaccines, Community Resources, Level of Care, Communication with, Health Screening, Doctor Visits, Referral to Nurse  [Encouraged Routine Engagement with Care Team Members & Providers.]  Labs Hgb A1c every 3 months, Kidney Function, Hgb A1c annually  [Encouraged Routine Lab Work.]  Vaccines Flu, Pneumonia, RSV, COVID-19, Shingles, Tetanus/Pertussis/Diphtheria  [Encouraged Routine Vaccinations.]  Doctor Visits Discussed/Reviewed Doctor Visits Discussed, Specialist, Doctor Visits Reviewed, Annual Wellness Visits, PCP  [Encouraged Routine Engagement with Care Team Members & Providers.]  Health Screening Bone Density, Colonoscopy, Mammogram  [Encouraged Routine Health Screenings.]  Durable Medical Equipment (DME) Bed  side commode, BP Cuff, Environmental consultant, Wheelchair, Tour manager, Other  [Prescription Eyeglasses, Medical laboratory scientific officer, Sports administrator, Psychologist, prison and probation services Hose.]  Wheelchair Standard  PCP/Specialist Visits Compliance with follow-up visit  [Encouraged Routine Engagement with Care Team Members & Providers.]  Communication with PCP/Specialists, Charity fundraiser, Pharmacists, Social Work  [employer] Routine Engagement with Care Team Members & Providers.]  Level of Care Adult Daycare, Air traffic controller, Assisted Living, Skilled Nursing Facility  [Confirmed Disinterest in Enrollment in Adult Day Care Program. Confirmed Current Place of Residence at [facility].]  Applications Medicaid, Personal Care Services  [Confirmed Disinterest in Applying for [employer] & Personal Care Services. Confirmed Interest in Applying for Special Assistance Long-Term Care Medicaid, Mailing Application & Offering to Assist with Completion & Submission.]  Exercise Interventions   Exercise Discussed/Reviewed Exercise Discussed, Assistive device use and maintanence, Exercise Reviewed, Physical Activity, Weight Managment  [Encouraged Daily Exercise Regimen, as Tolerated.]  Physical Activity Discussed/Reviewed Physical Activity Discussed, Home Exercise Program (HEP), PREP, Physical Activity Reviewed, Gym, Types of exercise  [Encouraged Increased Level of Activity & Exercise, Inside & Outside the Home.]  Weight Management Weight maintenance  [Encouraged Healthy Diet with Supplements.]  Education Interventions   Education Provided Provided Therapist, sports, Provided Web-based Education, Provided Education  [Provided Educational Material & Encouraged Independent Review.]  Provided Verbal Education On Nutrition, Mental Health/Coping with Illness, When to see the doctor, Foot Care, Eye Care, Labs, Blood Sugar Monitoring, Medication, Development worker, community, [employer], Exercise, Holiday representative of Educational Material.]  Labs Reviewed Hgb A1c   [Reviewed & Encouraged Logging Results.]  Ship broker, Personal Care Services  [Confirmed Disinterest in Applying for [employer] & Personal Care Services. Confirmed Interest in Applying for Special Assistance Long-Term Care Medicaid, Mailing Application & Offering to Assist with Completion & Submission.]  Mental Health Interventions  Mental Health Discussed/Reviewed Mental Health Discussed, Anxiety, Depression, Mental Health Reviewed, Grief and Loss, Substance Abuse, Coping Strategies, Suicide, Crisis, Other  [Assessed Mental Health & Cognitive Status.]  Nutrition Interventions   Nutrition Discussed/Reviewed Nutrition Discussed, Adding fruits and vegetables, Increasing proteins, Decreasing fats, Nutrition Reviewed, Fluid intake, Carbohydrate meal planning, Portion sizes, Decreasing salt, Supplemental nutrition, Decreasing sugar intake  [Encouraged Heart-Healthy, Renal-Friendly, Low Sodium, Diabetic-Friendly, Protein-Rich Diet with Supplements.]  Pharmacy Interventions   Pharmacy Dicussed/Reviewed Pharmacy Topics Discussed, Medications and their functions, Medication Adherence, Pharmacy Topics Reviewed, Affording Medications  [Confirmed Ability to Afford Prescription Medications.]  Medication Adherence --  [Confirmed Compliance with Prescription Medications.]  Safety Interventions   Safety Discussed/Reviewed Safety Discussed, Safety Reviewed, Fall Risk, Home Safety  [Encouraged Routine Use of Assistive Devices & Durable Medical Equipment.]  Journalist, newspaper, Refer for community resources, Need for home safety assessment  [Encouraged Consideration of Home Safety Evaluation.]  Advanced Directive Interventions   Advanced Directives Discussed/Reviewed Advanced Directives Discussed, Advanced Directives Reviewed  [Confirmed Initiation of Advanced Directives (Living Will, Healthcare Power of Attorney, & Out-of-Facility DNR Documents) & Scanned into Electronic Medical Record in Epic.]       Active Listening & Reflection Utilized.  Verbalization of Feelings Encouraged.  Emotional Support Provided. Problem Solving Interventions Employed. Acceptance & Commitment Therapy Performed. Cognitive Behavioral Therapy Initiated. CSW Collaboration with Medicaid Case Worker at [employer] [facility] [phone]) to Confirm Eligibility for Special Assistance Long-Term Care Medicaid. CSW Collaboration with Admissions Coordinator at [facility] (# [phone]) to Scheryl Marten Female Medicaid Bed Availability & Plans to Admit Patient Within The Next Week, Once Approved for Special Assistance Long-Term Care Medicaid, through [facility] Social Services [phone]).  Encouraged Engagement with Danford Bad, Licensed Clinical Social Worker with [facility], [facility] [phone]), if You Have Questions, Need Assistance, Additional Social Work Needs Are Identified in The Near Future, or If You Change Your Mind About Wanting to Receive Social Work Services.       SDOH assessments and interventions completed:  Yes.  Care Coordination Interventions:  Yes, provided.   Follow up plan: No further intervention required.   Encounter Outcome:  Patient Visit Completed.   Danford Bad, BSW, MSW, LCSW [facility], [facility] Clinical Social Worker II Direct Dial: [phone]  Fax: [phone] Website: [email or web address]

## 2023-07-24 NOTE — Patient Instructions (Signed)
 Visit Information  Thank you for taking time to visit with me today. Please don't hesitate to contact me if I can be of assistance to you.   Following are the goals we discussed today:   Goals Addressed             This Visit's Progress    COMPLETED: Receive Assistance Pursuing Long-Term Care Placement Arrangements.   On track    Care Coordination Interventions:  Interventions Today    Flowsheet Row Most Recent Value  Chronic Disease   Chronic disease during today's visit Hypertension (HTN), Chronic Kidney Disease/End Stage Renal Disease (ESRD), Other  [Mild Cognitive Impairment, Daughter Requesting Assistance Relocating Patient to Long-Term Care Medicaid Bed Once Private Pay Status Terminates at [facility] in March of 2025.]  General Interventions   General Interventions Discussed/Reviewed General Interventions Discussed, Labs, Annual Foot Exam, Lipid Profile, General Interventions Reviewed, Durable Medical Equipment (DME), Annual Eye Exam, Vaccines, Community Resources, Level of Care, Communication with, Health Screening, Doctor Visits, Referral to Nurse  [Encouraged Routine Engagement with Care Team Members & Providers.]  Labs Hgb A1c every 3 months, Kidney Function, Hgb A1c annually  [Encouraged Routine Lab Work.]  Vaccines Flu, Pneumonia, RSV, COVID-19, Shingles, Tetanus/Pertussis/Diphtheria  [Encouraged Routine Vaccinations.]  Doctor Visits Discussed/Reviewed Doctor Visits Discussed, Specialist, Doctor Visits Reviewed, Annual Wellness Visits, PCP  [Encouraged Routine Engagement with Care Team Members & Providers.]  Health Screening Bone Density, Colonoscopy, Mammogram  [Encouraged Routine Health Screenings.]  Durable Medical Equipment (DME) Bed side commode, BP Cuff, Environmental consultant, Wheelchair, Tour manager, Other  [Prescription Eyeglasses, Medical laboratory scientific officer, Sports administrator, Psychologist, prison and probation services Hose.]  Wheelchair Standard  PCP/Specialist Visits Compliance with follow-up visit  [Encouraged  Routine Engagement with Care Team Members & Providers.]  Communication with PCP/Specialists, Charity fundraiser, Pharmacists, Social Work  [employer] Routine Engagement with Care Team Members & Providers.]  Level of Care Adult Daycare, Air traffic controller, Assisted Living, Skilled Nursing Facility  [Confirmed Disinterest in Enrollment in Adult Day Care Program. Confirmed Current Place of Residence at [facility].]  Applications Medicaid, Personal Care Services  [Confirmed Disinterest in Applying for [employer] & Personal Care Services. Confirmed Interest in Applying for Special Assistance Long-Term Care Medicaid, Mailing Application & Offering to Assist with Completion & Submission.]  Exercise Interventions   Exercise Discussed/Reviewed Exercise Discussed, Assistive device use and maintanence, Exercise Reviewed, Physical Activity, Weight Managment  [Encouraged Daily Exercise Regimen, as Tolerated.]  Physical Activity Discussed/Reviewed Physical Activity Discussed, Home Exercise Program (HEP), PREP, Physical Activity Reviewed, Gym, Types of exercise  [Encouraged Increased Level of Activity & Exercise, Inside & Outside the Home.]  Weight Management Weight maintenance  [Encouraged Healthy Diet with Supplements.]  Education Interventions   Education Provided Provided Therapist, sports, Provided Web-based Education, Provided Education  [Provided Educational Material & Encouraged Independent Review.]  Provided Verbal Education On Nutrition, Mental Health/Coping with Illness, When to see the doctor, Foot Care, Eye Care, Labs, Blood Sugar Monitoring, Medication, Development worker, community, [employer], Exercise, Holiday representative of Educational Material.]  Labs Reviewed Hgb A1c  [Reviewed & Encouraged Logging Results.]  Ship broker, Personal Care Services  [Confirmed Disinterest in Applying for [employer] & Personal Care Services. Confirmed Interest in Applying for Special Assistance Long-Term  Care Medicaid, Mailing Application & Offering to Assist with Completion & Submission.]  Mental Health Interventions   Mental Health Discussed/Reviewed Mental Health Discussed, Anxiety, Depression, Mental Health Reviewed, Grief and Loss, Substance Abuse, Coping Strategies, Suicide, Crisis, Other  [Assessed Mental Health & Cognitive Status.]  Nutrition Interventions  Nutrition Discussed/Reviewed Nutrition Discussed, Adding fruits and vegetables, Increasing proteins, Decreasing fats, Nutrition Reviewed, Fluid intake, Carbohydrate meal planning, Portion sizes, Decreasing salt, Supplemental nutrition, Decreasing sugar intake  [Encouraged Heart-Healthy, Renal-Friendly, Low Sodium, Diabetic-Friendly, Protein-Rich Diet with Supplements.]  Pharmacy Interventions   Pharmacy Dicussed/Reviewed Pharmacy Topics Discussed, Medications and their functions, Medication Adherence, Pharmacy Topics Reviewed, Affording Medications  [Confirmed Ability to Afford Prescription Medications.]  Medication Adherence --  [Confirmed Compliance with Prescription Medications.]  Safety Interventions   Safety Discussed/Reviewed Safety Discussed, Safety Reviewed, Fall Risk, Home Safety  [Encouraged Routine Use of Assistive Devices & Durable Medical Equipment.]  Journalist, newspaper, Refer for community resources, Need for home safety assessment  [Encouraged Consideration of Home Safety Evaluation.]  Advanced Directive Interventions   Advanced Directives Discussed/Reviewed Advanced Directives Discussed, Advanced Directives Reviewed  [Confirmed Initiation of Advanced Directives (Living Will, Healthcare Power of Attorney, & Out-of-Facility DNR Documents) & Scanned into Electronic Medical Record in Epic.]      Active Listening & Reflection Utilized.  Verbalization of Feelings Encouraged.  Emotional Support Provided. Problem Solving Interventions Employed. Acceptance & Commitment Therapy Performed. Cognitive Behavioral Therapy  Initiated. CSW Collaboration with Medicaid Case Worker at [employer] [facility] [phone]) to Confirm Eligibility for Special Assistance Long-Term Care Medicaid. CSW Collaboration with Admissions Coordinator at [facility] (# [phone]) to Scheryl Marten Female Medicaid Bed Availability & Plans to Admit Patient Within The Next Week, Once Approved for Special Assistance Long-Term Care Medicaid, through [facility] [phone]).  Encouraged Engagement with Danford Bad, Licensed Clinical Social Worker with [facility], [facility] [phone]), if You Have Questions, Need Assistance, Additional Social Work Needs Are Identified in The Near Future, or If You Change Your Mind About Wanting to Receive Social Work Services.       Please call the care guide team at [phone] if you need to cancel or reschedule your appointment.   If you are experiencing a Mental Health or Behavioral Health Crisis or need someone to talk to, please call the Suicide and Crisis Lifeline: 988 call the [place] National Suicide Prevention Lifeline: 639-803-2462 or TTY: 503-350-1321 TTY 651-783-6476) to talk to a trained counselor call 1-800-273-TALK (toll free, 24 hour hotline) go to [facility] [address], [place] [phone]) call the [facility] Crisis Line: 3083226477 call 911  Patient verbalizes understanding of instructions and care plan provided today and agrees to view in MyChart. Active MyChart status and patient understanding of how to access instructions and care plan via MyChart confirmed with patient.     No further follow up required.  Danford Bad, BSW, MSW, LCSW [facility], [facility] Clinical Social Worker II Direct Dial: [phone]  Fax:  [phone] Website: [email or web address]

## 2023-08-13 ENCOUNTER — Telehealth: Payer: Self-pay | Admitting: Family Medicine

## 2023-08-13 NOTE — Telephone Encounter (Signed)
 Copied from CRM [id number]. Topic: General - Other >> Aug 13, 2023  4:16 PM Fredrica W wrote: Reason for CRM: Patient daughter called. Request to speak with social work. States she has spoken to them previously but was unsure how to get back in contact with them. Needs to speak with them to get forms completed for patient in timely manner. Thank You

## 2023-09-18 ENCOUNTER — Ambulatory Visit: Payer: Medicare HMO

## 2023-10-05 ENCOUNTER — Ambulatory Visit: Payer: Medicare HMO | Admitting: Family Medicine

## 2024-06-22 DEATH — deceased
